# Patient Record
Sex: Female | Born: 1945 | Race: Black or African American | Hispanic: No | Marital: Married | Smoking: Former smoker
Health system: Southern US, Community
[De-identification: ages and names within clinical notes are randomized; demographics above are authoritative.]

## PROBLEM LIST (undated history)

## (undated) DIAGNOSIS — I10 Essential (primary) hypertension: Secondary | ICD-10-CM

## (undated) DIAGNOSIS — I739 Peripheral vascular disease, unspecified: Secondary | ICD-10-CM

## (undated) DIAGNOSIS — K449 Diaphragmatic hernia without obstruction or gangrene: Secondary | ICD-10-CM

## (undated) DIAGNOSIS — E785 Hyperlipidemia, unspecified: Secondary | ICD-10-CM

## (undated) DIAGNOSIS — K219 Gastro-esophageal reflux disease without esophagitis: Secondary | ICD-10-CM

## (undated) DIAGNOSIS — M1990 Unspecified osteoarthritis, unspecified site: Secondary | ICD-10-CM

## (undated) DIAGNOSIS — K5900 Constipation, unspecified: Secondary | ICD-10-CM

## (undated) DIAGNOSIS — T7840XA Allergy, unspecified, initial encounter: Secondary | ICD-10-CM

## (undated) DIAGNOSIS — M6289 Other specified disorders of muscle: Secondary | ICD-10-CM

## (undated) DIAGNOSIS — M545 Low back pain, unspecified: Secondary | ICD-10-CM

## (undated) DIAGNOSIS — H3530 Unspecified macular degeneration: Secondary | ICD-10-CM

## (undated) DIAGNOSIS — D126 Benign neoplasm of colon, unspecified: Secondary | ICD-10-CM

## (undated) DIAGNOSIS — D649 Anemia, unspecified: Secondary | ICD-10-CM

## (undated) DIAGNOSIS — K589 Irritable bowel syndrome without diarrhea: Secondary | ICD-10-CM

## (undated) DIAGNOSIS — K222 Esophageal obstruction: Secondary | ICD-10-CM

## (undated) HISTORY — DX: Unspecified macular degeneration: H35.30

## (undated) HISTORY — DX: Esophageal obstruction: K22.2

## (undated) HISTORY — DX: Unspecified osteoarthritis, unspecified site: M19.90

## (undated) HISTORY — PX: BACK SURGERY: SHX140

## (undated) HISTORY — DX: Low back pain: M54.5

## (undated) HISTORY — PX: UPPER GASTROINTESTINAL ENDOSCOPY: SHX188

## (undated) HISTORY — DX: Allergy, unspecified, initial encounter: T78.40XA

## (undated) HISTORY — DX: Essential (primary) hypertension: I10

## (undated) HISTORY — DX: Peripheral vascular disease, unspecified: I73.9

## (undated) HISTORY — PX: KNEE SURGERY: SHX244

## (undated) HISTORY — PX: TOTAL ABDOMINAL HYSTERECTOMY W/ BILATERAL SALPINGOOPHORECTOMY: SHX83

## (undated) HISTORY — DX: Diaphragmatic hernia without obstruction or gangrene: K44.9

## (undated) HISTORY — DX: Constipation, unspecified: K59.00

## (undated) HISTORY — DX: Benign neoplasm of colon, unspecified: D12.6

## (undated) HISTORY — DX: Irritable bowel syndrome, unspecified: K58.9

## (undated) HISTORY — DX: Low back pain, unspecified: M54.50

## (undated) HISTORY — PX: OTHER SURGICAL HISTORY: SHX169

## (undated) HISTORY — PX: LUMBAR DISC SURGERY: SHX700

## (undated) HISTORY — PX: POLYPECTOMY: SHX149

## (undated) HISTORY — DX: Hyperlipidemia, unspecified: E78.5

## (undated) HISTORY — PX: COLONOSCOPY: SHX174

## (undated) HISTORY — PX: NECK SURGERY: SHX720

## (undated) HISTORY — DX: Gastro-esophageal reflux disease without esophagitis: K21.9

## (undated) HISTORY — PX: CHOLECYSTECTOMY: SHX55

## (undated) HISTORY — DX: Anemia, unspecified: D64.9

## (undated) HISTORY — DX: Other specified disorders of muscle: M62.89

---

## 1990-11-02 ENCOUNTER — Encounter: Payer: Self-pay | Admitting: Internal Medicine

## 1992-11-03 ENCOUNTER — Encounter: Payer: Self-pay | Admitting: Internal Medicine

## 1992-12-02 ENCOUNTER — Encounter: Payer: Self-pay | Admitting: Internal Medicine

## 1998-02-24 ENCOUNTER — Encounter: Admission: RE | Admit: 1998-02-24 | Discharge: 1998-02-24 | Payer: Self-pay | Admitting: *Deleted

## 1998-08-18 ENCOUNTER — Encounter: Payer: Self-pay | Admitting: Internal Medicine

## 1999-04-21 ENCOUNTER — Encounter: Admission: RE | Admit: 1999-04-21 | Discharge: 1999-04-21 | Payer: Self-pay | Admitting: Otolaryngology

## 1999-04-21 ENCOUNTER — Encounter: Payer: Self-pay | Admitting: Otolaryngology

## 2000-05-30 ENCOUNTER — Encounter: Payer: Self-pay | Admitting: Internal Medicine

## 2000-05-31 ENCOUNTER — Encounter: Payer: Self-pay | Admitting: Internal Medicine

## 2000-07-24 ENCOUNTER — Encounter: Payer: Self-pay | Admitting: Internal Medicine

## 2000-07-26 ENCOUNTER — Encounter: Payer: Self-pay | Admitting: Internal Medicine

## 2000-08-02 ENCOUNTER — Encounter: Payer: Self-pay | Admitting: Internal Medicine

## 2000-10-26 ENCOUNTER — Encounter: Payer: Self-pay | Admitting: Anesthesiology

## 2000-10-31 ENCOUNTER — Observation Stay (HOSPITAL_COMMUNITY): Admission: RE | Admit: 2000-10-31 | Discharge: 2000-11-01 | Payer: Self-pay | Admitting: *Deleted

## 2000-10-31 ENCOUNTER — Encounter (INDEPENDENT_AMBULATORY_CARE_PROVIDER_SITE_OTHER): Payer: Self-pay | Admitting: *Deleted

## 2000-10-31 ENCOUNTER — Encounter (INDEPENDENT_AMBULATORY_CARE_PROVIDER_SITE_OTHER): Payer: Self-pay | Admitting: Specialist

## 2000-12-11 ENCOUNTER — Encounter: Payer: Self-pay | Admitting: Internal Medicine

## 2001-01-17 ENCOUNTER — Encounter: Payer: Self-pay | Admitting: Family Medicine

## 2001-01-17 ENCOUNTER — Encounter: Admission: RE | Admit: 2001-01-17 | Discharge: 2001-01-17 | Payer: Self-pay | Admitting: Family Medicine

## 2002-06-03 ENCOUNTER — Encounter: Payer: Self-pay | Admitting: Family Medicine

## 2002-06-03 ENCOUNTER — Encounter: Admission: RE | Admit: 2002-06-03 | Discharge: 2002-06-03 | Payer: Self-pay | Admitting: Family Medicine

## 2002-11-10 ENCOUNTER — Encounter: Payer: Self-pay | Admitting: Internal Medicine

## 2002-11-21 ENCOUNTER — Emergency Department (HOSPITAL_COMMUNITY): Admission: EM | Admit: 2002-11-21 | Discharge: 2002-11-21 | Payer: Self-pay | Admitting: Emergency Medicine

## 2003-05-06 ENCOUNTER — Encounter: Payer: Self-pay | Admitting: Internal Medicine

## 2003-06-09 ENCOUNTER — Ambulatory Visit (HOSPITAL_COMMUNITY): Admission: RE | Admit: 2003-06-09 | Discharge: 2003-06-09 | Payer: Self-pay | Admitting: Internal Medicine

## 2003-06-09 ENCOUNTER — Encounter: Payer: Self-pay | Admitting: Internal Medicine

## 2003-06-09 DIAGNOSIS — K319 Disease of stomach and duodenum, unspecified: Secondary | ICD-10-CM | POA: Insufficient documentation

## 2003-07-22 ENCOUNTER — Encounter: Admission: RE | Admit: 2003-07-22 | Discharge: 2003-07-22 | Payer: Self-pay | Admitting: Family Medicine

## 2003-10-11 ENCOUNTER — Emergency Department (HOSPITAL_COMMUNITY): Admission: EM | Admit: 2003-10-11 | Discharge: 2003-10-12 | Payer: Self-pay | Admitting: Emergency Medicine

## 2003-10-21 ENCOUNTER — Encounter: Payer: Self-pay | Admitting: Internal Medicine

## 2003-10-22 ENCOUNTER — Encounter (INDEPENDENT_AMBULATORY_CARE_PROVIDER_SITE_OTHER): Payer: Self-pay | Admitting: *Deleted

## 2003-10-22 ENCOUNTER — Ambulatory Visit (HOSPITAL_COMMUNITY): Admission: RE | Admit: 2003-10-22 | Discharge: 2003-10-22 | Payer: Self-pay | Admitting: Internal Medicine

## 2003-12-30 ENCOUNTER — Encounter: Admission: RE | Admit: 2003-12-30 | Discharge: 2003-12-30 | Payer: Self-pay | Admitting: Sports Medicine

## 2004-01-14 ENCOUNTER — Encounter: Admission: RE | Admit: 2004-01-14 | Discharge: 2004-01-14 | Payer: Self-pay | Admitting: Sports Medicine

## 2004-01-28 ENCOUNTER — Encounter: Admission: RE | Admit: 2004-01-28 | Discharge: 2004-01-28 | Payer: Self-pay | Admitting: Sports Medicine

## 2004-05-13 ENCOUNTER — Ambulatory Visit: Payer: Self-pay | Admitting: Family Medicine

## 2004-05-20 ENCOUNTER — Ambulatory Visit: Payer: Self-pay | Admitting: Family Medicine

## 2004-07-06 ENCOUNTER — Encounter: Admission: RE | Admit: 2004-07-06 | Discharge: 2004-07-06 | Payer: Self-pay | Admitting: Sports Medicine

## 2004-07-13 ENCOUNTER — Encounter: Admission: RE | Admit: 2004-07-13 | Discharge: 2004-07-13 | Payer: Self-pay | Admitting: Family Medicine

## 2004-09-21 ENCOUNTER — Encounter: Admission: RE | Admit: 2004-09-21 | Discharge: 2004-09-21 | Payer: Self-pay | Admitting: Sports Medicine

## 2004-10-24 ENCOUNTER — Encounter: Admission: RE | Admit: 2004-10-24 | Discharge: 2004-10-24 | Payer: Self-pay | Admitting: Sports Medicine

## 2005-05-22 ENCOUNTER — Ambulatory Visit: Payer: Self-pay | Admitting: Family Medicine

## 2005-06-14 ENCOUNTER — Encounter: Admission: RE | Admit: 2005-06-14 | Discharge: 2005-06-14 | Payer: Self-pay | Admitting: Family Medicine

## 2005-07-06 ENCOUNTER — Encounter: Admission: RE | Admit: 2005-07-06 | Discharge: 2005-07-06 | Payer: Self-pay | Admitting: Sports Medicine

## 2005-09-05 ENCOUNTER — Encounter: Admission: RE | Admit: 2005-09-05 | Discharge: 2005-09-05 | Payer: Self-pay | Admitting: Sports Medicine

## 2005-09-12 ENCOUNTER — Ambulatory Visit: Payer: Self-pay | Admitting: Family Medicine

## 2006-01-08 ENCOUNTER — Ambulatory Visit: Payer: Self-pay | Admitting: Family Medicine

## 2006-05-24 ENCOUNTER — Ambulatory Visit: Payer: Self-pay | Admitting: Family Medicine

## 2006-05-24 LAB — CONVERTED CEMR LAB
ALT: 11 units/L (ref 0–40)
AST: 14 units/L (ref 0–37)
BUN: 12 mg/dL (ref 6–23)
Bilirubin, Direct: 0.1 mg/dL (ref 0.0–0.3)
Calcium: 9.7 mg/dL (ref 8.4–10.5)
Chloride: 100 meq/L (ref 96–112)
Eosinophils Relative: 4.1 % (ref 0.0–5.0)
GFR calc non Af Amer: 68 mL/min
HCT: 35.9 % — ABNORMAL LOW (ref 36.0–46.0)
Lymphocytes Relative: 35.2 % (ref 12.0–46.0)
Monocytes Absolute: 0.4 10*3/uL (ref 0.2–0.7)
Neutro Abs: 3.4 10*3/uL (ref 1.4–7.7)
Neutrophils Relative %: 53.4 % (ref 43.0–77.0)
Platelets: 264 10*3/uL (ref 150–400)
Potassium: 3.6 meq/L (ref 3.5–5.1)
Sodium: 137 meq/L (ref 135–145)
Total CHOL/HDL Ratio: 5.5
VLDL: 25 mg/dL (ref 0–40)
WBC: 6.5 10*3/uL (ref 4.5–10.5)

## 2006-05-31 ENCOUNTER — Ambulatory Visit: Payer: Self-pay | Admitting: Family Medicine

## 2006-06-21 ENCOUNTER — Encounter: Admission: RE | Admit: 2006-06-21 | Discharge: 2006-06-21 | Payer: Self-pay | Admitting: Family Medicine

## 2006-07-31 ENCOUNTER — Ambulatory Visit: Payer: Self-pay | Admitting: Family Medicine

## 2006-07-31 LAB — CONVERTED CEMR LAB
ALT: 13 units/L (ref 0–40)
Alkaline Phosphatase: 92 units/L (ref 39–117)
HDL: 36.1 mg/dL — ABNORMAL LOW (ref >39.0)
Total CHOL/HDL Ratio: 3.8
Total Protein: 6.4 g/dL (ref 6.0–8.3)

## 2006-08-02 ENCOUNTER — Ambulatory Visit: Payer: Self-pay | Admitting: Internal Medicine

## 2006-11-13 ENCOUNTER — Ambulatory Visit: Payer: Self-pay | Admitting: Internal Medicine

## 2006-11-15 ENCOUNTER — Ambulatory Visit: Payer: Self-pay | Admitting: Internal Medicine

## 2006-11-15 ENCOUNTER — Encounter: Payer: Self-pay | Admitting: Family Medicine

## 2006-12-14 ENCOUNTER — Encounter: Admission: RE | Admit: 2006-12-14 | Discharge: 2006-12-14 | Payer: Self-pay | Admitting: Sports Medicine

## 2006-12-27 ENCOUNTER — Ambulatory Visit: Payer: Self-pay | Admitting: Internal Medicine

## 2006-12-28 ENCOUNTER — Encounter: Admission: RE | Admit: 2006-12-28 | Discharge: 2006-12-28 | Payer: Self-pay | Admitting: Sports Medicine

## 2007-01-04 ENCOUNTER — Ambulatory Visit: Payer: Self-pay | Admitting: Family Medicine

## 2007-01-11 ENCOUNTER — Encounter: Admission: RE | Admit: 2007-01-11 | Discharge: 2007-01-11 | Payer: Self-pay | Admitting: Sports Medicine

## 2007-05-13 ENCOUNTER — Telehealth: Payer: Self-pay | Admitting: Family Medicine

## 2007-06-04 DIAGNOSIS — K589 Irritable bowel syndrome without diarrhea: Secondary | ICD-10-CM | POA: Insufficient documentation

## 2007-06-04 DIAGNOSIS — M129 Arthropathy, unspecified: Secondary | ICD-10-CM | POA: Insufficient documentation

## 2007-06-04 DIAGNOSIS — K219 Gastro-esophageal reflux disease without esophagitis: Secondary | ICD-10-CM | POA: Insufficient documentation

## 2007-06-04 DIAGNOSIS — E785 Hyperlipidemia, unspecified: Secondary | ICD-10-CM | POA: Insufficient documentation

## 2007-06-04 DIAGNOSIS — I10 Essential (primary) hypertension: Secondary | ICD-10-CM | POA: Insufficient documentation

## 2007-06-18 ENCOUNTER — Ambulatory Visit: Payer: Self-pay | Admitting: Family Medicine

## 2007-06-18 LAB — CONVERTED CEMR LAB
AST: 17 units/L (ref 0–37)
Albumin: 3.9 g/dL (ref 3.5–5.2)
Alkaline Phosphatase: 70 units/L (ref 39–117)
BUN: 13 mg/dL (ref 6–23)
Bilirubin, Direct: 0.1 mg/dL (ref 0.0–0.3)
Blood in Urine, dipstick: NEGATIVE
Chloride: 104 meq/L (ref 96–112)
Glucose, Bld: 93 mg/dL (ref 70–99)
HCT: 34.9 % — ABNORMAL LOW (ref 36.0–46.0)
HDL: 34.1 mg/dL — ABNORMAL LOW (ref >39.0)
Ketones, urine, test strip: NEGATIVE
LDL Cholesterol: 102 mg/dL — ABNORMAL HIGH (ref 0–99)
MCHC: 33 g/dL (ref 30.0–36.0)
MCV: 95.1 fL (ref 78.0–100.0)
Neutro Abs: 2.9 10*3/uL (ref 1.4–7.7)
Nitrite: NEGATIVE
Potassium: 4.4 meq/L (ref 3.5–5.1)
Protein, U semiquant: NEGATIVE
RBC: 3.67 M/uL — ABNORMAL LOW (ref 3.87–5.11)
RDW: 12.7 % (ref 11.5–14.6)
Sodium: 141 meq/L (ref 135–145)
Total Protein: 6.6 g/dL (ref 6.0–8.3)
Urobilinogen, UA: 0.2
WBC Urine, dipstick: NEGATIVE

## 2007-06-25 ENCOUNTER — Ambulatory Visit: Payer: Self-pay | Admitting: Family Medicine

## 2007-06-25 DIAGNOSIS — I739 Peripheral vascular disease, unspecified: Secondary | ICD-10-CM | POA: Insufficient documentation

## 2007-06-25 DIAGNOSIS — J309 Allergic rhinitis, unspecified: Secondary | ICD-10-CM | POA: Insufficient documentation

## 2007-06-25 LAB — CONVERTED CEMR LAB
Basophils Relative: 1 % (ref 0.0–1.0)
HCT: 35.3 % — ABNORMAL LOW (ref 36.0–46.0)
Hemoglobin: 11.3 g/dL — ABNORMAL LOW (ref 12.0–15.0)
Lymphocytes Relative: 35.5 % (ref 12.0–46.0)
MCV: 97.4 fL (ref 78.0–100.0)
Monocytes Relative: 8 % (ref 3.0–12.0)
Neutro Abs: 3.1 10*3/uL (ref 1.4–7.7)
Neutrophils Relative %: 49.5 % (ref 43.0–77.0)
Platelets: 250 10*3/uL (ref 150–400)
RDW: 12.5 % (ref 11.5–14.6)
Transferrin: 258.5 mg/dL (ref >=212.0)

## 2007-06-26 ENCOUNTER — Encounter: Payer: Self-pay | Admitting: Family Medicine

## 2007-06-26 LAB — CONVERTED CEMR LAB: Retic Ct Pct: 1.8 % (ref 0.4–3.1)

## 2007-06-27 ENCOUNTER — Ambulatory Visit: Payer: Self-pay | Admitting: Family Medicine

## 2007-06-27 DIAGNOSIS — D510 Vitamin B12 deficiency anemia due to intrinsic factor deficiency: Secondary | ICD-10-CM | POA: Insufficient documentation

## 2007-07-08 ENCOUNTER — Encounter: Payer: Self-pay | Admitting: Family Medicine

## 2007-07-23 ENCOUNTER — Ambulatory Visit: Payer: Self-pay | Admitting: Family Medicine

## 2007-07-23 LAB — CONVERTED CEMR LAB
Basophils Absolute: 0 10*3/uL (ref 0.0–0.1)
Eosinophils Absolute: 0.4 10*3/uL (ref 0.0–0.7)
Eosinophils Relative: 5.6 % — ABNORMAL HIGH (ref 0.0–5.0)
MCV: 96.5 fL (ref 78.0–100.0)
Monocytes Absolute: 0.5 10*3/uL (ref 0.1–1.0)
Monocytes Relative: 7.8 % (ref 3.0–12.0)
Neutro Abs: 3.5 10*3/uL (ref 1.4–7.7)
Platelets: 255 10*3/uL (ref 150–400)
RBC: 3.59 M/uL — ABNORMAL LOW (ref 3.87–5.11)
WBC: 6.8 10*3/uL (ref 4.5–10.5)

## 2007-08-13 ENCOUNTER — Telehealth: Payer: Self-pay | Admitting: Family Medicine

## 2007-09-23 ENCOUNTER — Ambulatory Visit: Payer: Self-pay | Admitting: Family Medicine

## 2007-09-23 LAB — CONVERTED CEMR LAB
Basophils Relative: 0.8 % (ref 0.0–1.0)
Eosinophils Absolute: 0.4 10*3/uL (ref 0.0–0.7)
Eosinophils Relative: 6.4 % — ABNORMAL HIGH (ref 0.0–5.0)
Hemoglobin: 11.4 g/dL — ABNORMAL LOW (ref 12.0–15.0)
Lymphocytes Relative: 32 % (ref 12.0–46.0)
MCV: 96.4 fL (ref 78.0–100.0)
Monocytes Absolute: 0.5 10*3/uL (ref 0.1–1.0)
Monocytes Relative: 8.7 % (ref 3.0–12.0)
Platelets: 257 10*3/uL (ref 150–400)
RBC: 3.44 M/uL — ABNORMAL LOW (ref 3.87–5.11)

## 2007-10-01 ENCOUNTER — Telehealth: Payer: Self-pay | Admitting: *Deleted

## 2007-10-03 ENCOUNTER — Telehealth: Payer: Self-pay | Admitting: Family Medicine

## 2008-01-09 ENCOUNTER — Ambulatory Visit: Payer: Self-pay | Admitting: Family Medicine

## 2008-05-07 ENCOUNTER — Encounter: Admission: RE | Admit: 2008-05-07 | Discharge: 2008-05-07 | Payer: Self-pay | Admitting: Sports Medicine

## 2008-05-25 ENCOUNTER — Encounter: Admission: RE | Admit: 2008-05-25 | Discharge: 2008-05-25 | Payer: Self-pay | Admitting: Sports Medicine

## 2008-06-09 ENCOUNTER — Telehealth: Payer: Self-pay | Admitting: Family Medicine

## 2008-06-19 ENCOUNTER — Telehealth: Payer: Self-pay | Admitting: Family Medicine

## 2008-07-02 ENCOUNTER — Telehealth: Payer: Self-pay | Admitting: Family Medicine

## 2008-07-09 ENCOUNTER — Ambulatory Visit: Payer: Self-pay | Admitting: Family Medicine

## 2008-07-09 LAB — CONVERTED CEMR LAB
AST: 20 units/L (ref 0–37)
Albumin: 3.6 g/dL (ref 3.5–5.2)
Alkaline Phosphatase: 73 units/L (ref 39–117)
BUN: 12 mg/dL (ref 6–23)
Basophils Absolute: 0 10*3/uL (ref 0.0–0.1)
Calcium: 9.5 mg/dL (ref 8.4–10.5)
Chloride: 104 meq/L (ref 96–112)
Creatinine, Ser: 0.8 mg/dL (ref 0.4–1.2)
Eosinophils Relative: 1.8 % (ref 0.0–5.0)
Glucose, Bld: 100 mg/dL — ABNORMAL HIGH (ref 70–99)
LDL Cholesterol: 98 mg/dL (ref 0–99)
Lymphocytes Relative: 28.7 % (ref 12.0–46.0)
Lymphs Abs: 2.2 10*3/uL (ref 0.7–4.0)
MCHC: 33.4 g/dL (ref 30.0–36.0)
MCV: 98.3 fL (ref 78.0–100.0)
Monocytes Relative: 6.5 % (ref 3.0–12.0)
Neutrophils Relative %: 62.4 % (ref 43.0–77.0)
Potassium: 3.5 meq/L (ref 3.5–5.1)
RBC: 3.52 M/uL — ABNORMAL LOW (ref 3.87–5.11)
RDW: 14.1 % (ref 11.5–14.6)
Total Bilirubin: 0.7 mg/dL (ref 0.3–1.2)
Total CHOL/HDL Ratio: 4
Total Protein: 6.9 g/dL (ref 6.0–8.3)

## 2008-07-14 ENCOUNTER — Encounter: Payer: Self-pay | Admitting: Family Medicine

## 2008-07-22 ENCOUNTER — Encounter: Admission: RE | Admit: 2008-07-22 | Discharge: 2008-07-22 | Payer: Self-pay | Admitting: Sports Medicine

## 2008-08-18 ENCOUNTER — Ambulatory Visit: Payer: Self-pay | Admitting: Family Medicine

## 2008-09-07 ENCOUNTER — Telehealth: Payer: Self-pay | Admitting: Internal Medicine

## 2008-09-15 ENCOUNTER — Inpatient Hospital Stay (HOSPITAL_COMMUNITY): Admission: AD | Admit: 2008-09-15 | Discharge: 2008-09-17 | Payer: Self-pay | Admitting: Neurosurgery

## 2008-11-02 ENCOUNTER — Ambulatory Visit: Payer: Self-pay | Admitting: Family Medicine

## 2009-02-24 ENCOUNTER — Encounter: Payer: Self-pay | Admitting: Family Medicine

## 2009-07-22 LAB — HM MAMMOGRAPHY

## 2009-08-02 ENCOUNTER — Encounter: Payer: Self-pay | Admitting: Family Medicine

## 2009-09-02 ENCOUNTER — Telehealth: Payer: Self-pay | Admitting: Family Medicine

## 2009-09-09 ENCOUNTER — Ambulatory Visit: Payer: Self-pay | Admitting: Family Medicine

## 2009-09-09 DIAGNOSIS — N952 Postmenopausal atrophic vaginitis: Secondary | ICD-10-CM | POA: Insufficient documentation

## 2009-09-15 ENCOUNTER — Ambulatory Visit: Payer: Self-pay | Admitting: Family Medicine

## 2009-09-15 LAB — CONVERTED CEMR LAB
ALT: 13 units/L (ref 0–35)
AST: 16 units/L (ref 0–37)
Albumin: 4.2 g/dL (ref 3.5–5.2)
BUN: 11 mg/dL (ref 6–23)
Basophils Absolute: 0.1 10*3/uL (ref 0.0–0.1)
Bilirubin Urine: NEGATIVE
Blood in Urine, dipstick: NEGATIVE
CO2: 32 meq/L (ref 19–32)
Calcium: 9.3 mg/dL (ref 8.4–10.5)
Chloride: 104 meq/L (ref 96–112)
Direct LDL: 95.4 mg/dL
Eosinophils Relative: 6.1 % — ABNORMAL HIGH (ref 0.0–5.0)
Glucose, Bld: 97 mg/dL (ref 70–99)
HDL: 40.5 mg/dL (ref >39.00)
Lymphocytes Relative: 31.9 % (ref 12.0–46.0)
MCHC: 34 g/dL (ref 30.0–36.0)
MCV: 94.7 fL (ref 78.0–100.0)
Neutrophils Relative %: 53.7 % (ref 43.0–77.0)
Nitrite: NEGATIVE
Platelets: 250 10*3/uL (ref 150.0–400.0)
Potassium: 4 meq/L (ref 3.5–5.1)
TSH: 1.47 microintl units/mL (ref 0.35–5.50)
Total Protein: 7.1 g/dL (ref 6.0–8.3)
Urobilinogen, UA: 1
VLDL: 40.6 mg/dL — ABNORMAL HIGH (ref 0.0–40.0)
WBC Urine, dipstick: NEGATIVE
pH: 7

## 2009-09-30 ENCOUNTER — Ambulatory Visit: Payer: Self-pay | Admitting: Family Medicine

## 2009-09-30 ENCOUNTER — Telehealth: Payer: Self-pay | Admitting: Internal Medicine

## 2009-10-25 ENCOUNTER — Ambulatory Visit: Payer: Self-pay | Admitting: Internal Medicine

## 2009-10-29 ENCOUNTER — Encounter: Payer: Self-pay | Admitting: Internal Medicine

## 2009-11-25 ENCOUNTER — Ambulatory Visit: Payer: Self-pay | Admitting: Internal Medicine

## 2009-11-25 LAB — HM COLONOSCOPY

## 2009-12-01 ENCOUNTER — Encounter: Payer: Self-pay | Admitting: Internal Medicine

## 2010-01-13 ENCOUNTER — Telehealth: Payer: Self-pay | Admitting: Internal Medicine

## 2010-02-08 ENCOUNTER — Ambulatory Visit: Payer: Self-pay | Admitting: Internal Medicine

## 2010-02-08 DIAGNOSIS — Z860100 Personal history of colon polyps, unspecified: Secondary | ICD-10-CM | POA: Insufficient documentation

## 2010-03-02 ENCOUNTER — Encounter: Payer: Self-pay | Admitting: Family Medicine

## 2010-04-17 ENCOUNTER — Encounter: Payer: Self-pay | Admitting: Family Medicine

## 2010-04-17 ENCOUNTER — Encounter: Payer: Self-pay | Admitting: Internal Medicine

## 2010-04-18 ENCOUNTER — Ambulatory Visit
Admission: RE | Admit: 2010-04-18 | Discharge: 2010-04-18 | Payer: Self-pay | Source: Home / Self Care | Attending: Family Medicine | Admitting: Family Medicine

## 2010-04-20 ENCOUNTER — Telehealth: Payer: Self-pay | Admitting: Family Medicine

## 2010-04-26 NOTE — Miscellaneous (Signed)
Summary: mammmogram  Clinical Lists Changes  Observations: Added new observation of MAMMOGRAM: normal (07/22/2009 17:16)      Preventive Care Screening  Mammogram:    Date:  07/22/2009    Results:  normal

## 2010-04-26 NOTE — Op Note (Signed)
Summary: Operative report                         [facility]  Patient:    Kelsey Sparks, Kelsey Sparks                    MRN: [id number] Proc. Date: 10/31/00 Adm. Date:  [id number] Attending:  Kandis Mannan CC:         Tera Mater. Clent Ridges, M.D.  Jeralyn Bennett, M.D.   Operative Report  CCS NUMBER:  [id number]  PREOPERATIVE DIAGNOSIS:  Chronic cholecystitis with cholelithiasis.  POSTOPERATIVE DIAGNOSIS:  Chronic cholecystitis with cholelithiasis.  OPERATION:  Laparoscopic cholecystectomy.  SURGEON:  Maisie Fus B. Samuella Cota, M.D.  ASSISTANT:  Gita Kudo, M.D.  ANESTHESIA:  General.  ANESTHESIOLOGIST:  Lestine Box, M.D. and CRNA.  DESCRIPTION OF PROCEDURE:  The patient was taken to the operating room and placed on the table in the supine position and after satisfactory general anesthetic with intubation, the entire abdomen was prepped and draped as a sterile field.  A small vertical infraumbilical incision was made through skin and subcutaneous tissue and midline fascia.  A finger was placed into the peritoneal cavity.  A pursestring suture of 0 Vicryl was placed and the Hasson trocar placed in the abdomen, the abdomen insufflated to 14 mmHg pressure.  A second 10 mm trocar was placed just to the right of midline in the subxiphoid area.  Two 5 mm trocars were placed laterally.  The gallbladder was visualized and had adhesions that extended up about two-thirds of the way of the gallbladder.  The gallbladder was quite large and extended beyond the edge of the liver.  The adhesions were taken down, and the area of the cystic duct and cystic artery was dissected.  The cystic duct was readily identified as it was going into the gallbladder.  The cystic duct was fairly long.  The cystic duct was triply clipped on the remaining side, once on the gallbladder side and divided.  This gave better exposure to the cystic artery which was dissected free, triply clipped on the remaining side  and once on the gallbladder side and divided.  The gallbladder was then dissected from the bed using the cautery.  There was another small vessel in the gallbladder bed, and this was doubly clipped.  The gallbladder was freed-up and hemostasis obtained in the gallbladder bed.  The gallbladder was then brought to the infraumbilical incision where it was delivered without difficulty.  The right upper quadrant was irrigated with saline.  Hemostasis was obtained.  The two lateral trocars were removed under direct vision.  The pursestring suture at the infraumbilical incision was tied to give good closure of the fascia.  The abdomen was deflated and the second 10 mm trocar removed.  Marcaine 0.25% without epinephrine had been injected at all trocar sites.  The two midline incisions were closed with running subcuticular sutures of 4-0 Vicryl, and the two lateral trocar sites were closed with single simple subcuticular 4-0 Vicryl.  Benzoin and half-inch Steri-Strips were used to reinforce the skin closure.  Dry sterile dressing was applied.  The patient seemed to tolerate the procedure well and was taken to the PACU in satisfactory condition. DD:  10/31/00 TD:  10/31/00 Job: [id number]

## 2010-04-26 NOTE — Procedures (Signed)
Summary: EGD/[facility]  EGD/[facility]   Imported By: Sherian Rein 10/26/2009 10:54:18  _____________________________________________________________________  External Attachment:    Type:   Image     Comment:   External Document

## 2010-04-26 NOTE — Progress Notes (Signed)
Summary: [facility]   Imported By: Sherian Rein 10/26/2009 11:02:15  _____________________________________________________________________  External Attachment:    Type:   Image     Comment:   External Document

## 2010-04-26 NOTE — Procedures (Signed)
Summary: Colonoscopy  Patient: Kelsey Sparks Note: All result statuses are Final unless otherwise noted.  Tests: (1) Colonoscopy (COL)   COL Colonoscopy           DONE     [facility]     [address]     [place], [place]  [address]           COLONOSCOPY PROCEDURE REPORT           PATIENT:  Kelsey Sparks, Kelsey Sparks  MR#:  [id number]     BIRTHDATE:  1945-10-11, 64 yrs. old  GENDER:  female     ENDOSCOPIST:  Wilhemina Bonito. Eda Keys, MD     REF. BY:  Office Self, M.D.     PROCEDURE DATE:  11/25/2009     PROCEDURE:  Colonoscopy with biopsies,     Colonoscopy with snare polypectomy     x 13     EXTENED SERVICE (TIME > 30 MIN)     AND MULTIPLE POLYPS (N=13)     ASA CLASS:  Class II     INDICATIONS:  Routine Risk Screening, unexplained diarrhea     MEDICATIONS:   Fentanyl 75 mcg IV, Versed 7 mg IV           DESCRIPTION OF PROCEDURE:   After the risks benefits and     alternatives of the procedure were thoroughly explained, informed     consent was obtained.  Digital rectal exam was performed and     revealed no abnormalities.   The LB CF-[id number] endoscope     was introduced through the anus and advanced to the cecum, which     was identified by both the appendix and ileocecal valve, without     limitations.Time to cecum = 6:33 min.  The quality of the prep was     excellent, using MoviPrep.  The instrument was then slowly     withdrawn (time = 21:55 min) as the colon was fully examined.     <<PROCEDUREIMAGES>>           FINDINGS:  There were multiple (13) polyps, measuring between 2mm     and 8mm, identified and removed in the ascending (5) and     transverse (8)  colon. Polyps were snared without cautery.     Retrieval was successful.  Moderate diverticulosis was found     ascending colon to sigmoid colon. Random colon bx of normal     looking mucosa taken to r/o microscopc colitis.  Retroflexed views     in the rectum revealed no abnormalities.    The scope was then  withdrawn from the patient and the procedure completed.           COMPLICATIONS:  None           ENDOSCOPIC IMPRESSION:     1) Polyps, multiple (13) - removed     2) Moderate diverticulosis ascending colon to sigmoid colon           RECOMMENDATIONS:     1) Await biopsy results     2) Follow up colonoscopy in ONE year     3) EGD today           ______________________________     Wilhemina Bonito. Eda Keys, MD           CC:  The Patient; Roderick Pee, MD           n.     eSIGNED:  Wilhemina Bonito. Eda Keys at 11/25/2009 03:32 PM           Montel Clock, [id number]  Note: An exclamation mark (!) indicates a result that was not dispersed into the flowsheet. Document Creation Date: 11/25/2009 3:33 PM _______________________________________________________________________  (1) Order result status: Final Collection or observation date-time: 11/25/2009 15:21 Requested date-time:  Receipt date-time:  Reported date-time:  Referring Physician:   Ordering Physician: Fransico Setters [phone]) Specimen Source:  Source: Launa Grill Order Number: [id number] Lab site:   Appended Document: Colonoscopy recall     Procedures Next Due Date:    Colonoscopy: 11/2014

## 2010-04-26 NOTE — Procedures (Signed)
Summary: EGD   EGD  Procedure date:  06/09/2003  Findings:      Location: [facility]  Findings: Stricture:  GERD Patient Name: Kelsey Sparks, Kelsey Sparks MRN: [id number] Procedure Procedures: Panendoscopy (EGD) CPT: 43235.    with esophageal dilation. CPT: G9296129.  Personnel: Endoscopist: Wilhemina Bonito. Marina Goodell, MD.  Exam Location: Exam performed in Endoscopy Suite.  Patient Consent: Procedure, Alternatives, Risks and Benefits discussed, consent obtained,  Indications Symptoms: Dysphagia. Reflux symptoms  History  Current Medications: Patient is not currently taking Coumadin.  Pre-Exam Physical: Performed Jun 09, 2003  Entire physical exam was normal.  Exam Exam Info: Maximum depth of insertion Duodenum, intended Duodenum. Patient position: on left side. Vocal cords visualized. Gastric retroflexion performed. Images taken. ASA Classification: II.  Sedation Meds: Demerol 60 mg. given IV. Versed 6 mg. given IV.  Monitoring: BP and pulse monitoring done. Oximetry used. Supplemental O2 given  Fluoroscopy: Fluoroscopy was used.  Findings HIATAL HERNIA:  STRICTURE / STENOSIS: Stricture in Distal Esophagus.  Constriction: partial. Etiology: benign due to reflux. 40 cm from mouth. Lumen diameter is 15 mm. ICD9: Esophageal Stricture: 530.3. Comment: no active esophagitis present.  - Dilation: Distal Esophagus. Procedure was performed under Fluoroscopy. Wire Guided/Savary (Wilson-Cook) dilator used, Diameter: 18 mm, No Resistance, No Heme present on extraction. 1  total dilators used. Patient tolerance excellent.   Assessment Abnormal examination, see findings above.  Diagnoses: 530.3: Esophageal Stricture.  530.81: GERD.   Events  Unplanned Intervention: No unplanned interventions were required.  Unplanned Events: There were no complications. Plans Instructions: Nothing to eat or drink for 2 hrs.  Clear or full liquids: 2 hrs. Resume previous diet: am.    Medication(s): Continue current medications.  Disposition: After procedure patient sent to recovery. After recovery patient sent home.  Scheduling: Follow-up prn.   This report was created from the original endoscopy report, which was reviewed and signed by the above listed endoscopist.   cc:  Kelle Darting, MD      The patient

## 2010-04-26 NOTE — Assessment & Plan Note (Signed)
Summary: Followup - post ECL   History of Present Illness Visit Type: Follow-up Visit Primary GI MD: Yancey Flemings MD Primary Provider: Kelle Darting, MD Requesting Provider: n/a Chief Complaint: Patient here for 6 wk f/u after endo/colon. She complains of continued lower abdominal cramping and reflux symptoms. History of Present Illness:   65 year old female with hypertension, hyperlipidemia, chronic low back pain, GERD complicated by peptic stricture and irritable bowel syndrome. She was evaluated in the office October 25, 2009 regarding diarrhea and dysphagia. See that dictation for details. She subsequently underwent colonoscopy and upper endoscopy on November 25, 2009. Colonoscopy revealed multiple (13) adenomatous polyps and moderate diverticulosis. Random biopsies of the colon revealed normal mucosa. Followup colonoscopy, based on current guidelines, recommended for one year. Upper endoscopy revealed a peptic stricture of the distal esophagus. This was dilated with a 54 [place] Maloney dilator. She has continued on AcipHex. She follows up today. She reports no active reflux symptoms. She has had complete resolution of dysphagia. She still continues with some alternating bowel habits and lower abdominal cramping, though less. She has been using Levsin sublingual p.r.n. We reviewed her endoscopy and pathology findings in detail. No new issues.   GI Review of Systems    Reports abdominal pain, acid reflux, and  bloating.     Location of  Abdominal pain: lower abdomen.    Denies belching, chest pain, dysphagia with liquids, dysphagia with solids, heartburn, loss of appetite, nausea, vomiting, vomiting blood, weight loss, and  weight gain.      Reports change in bowel habits, constipation, diarrhea, and  irritable bowel syndrome.     Denies anal fissure, black tarry stools, diverticulosis, fecal incontinence, heme positive stool, hemorrhoids, jaundice, light color stool, liver problems, rectal  bleeding, and  rectal pain. Preventive Screening-Counseling & Management  Caffeine-Diet-Exercise     Does Patient Exercise: yes    Current Medications (verified): 1)  Tenormin 50 Mg  Tabs (Atenolol) .... Take 1 Tablet By Mouth Every Morning 2)  Maxzide-25 37.5-25 Mg  Tabs (Triamterene-Hctz) .... Take 1 Tablet By Mouth Every Morning 3)  Zocor 40 Mg  Tabs (Simvastatin) .... Once Daily 4)  Nasonex 50 Mcg/act  Susp (Mometasone Furoate) .... 2 Squirts Two Times A Day 5)  Flexeril 10 Mg  Tabs (Cyclobenzaprine Hcl) .... 1/2 Three Times A Day As Needed 6)  Zomig Zmt 5 Mg  Tbdp (Zolmitriptan) .... As Needed Ha 7)  Hydrocodone-Acetaminophen 5-500 Mg Tabs (Hydrocodone-Acetaminophen) .... Take 1-2 Tabs By Mouth Every 8 Hours As Needed 8)  Meclizine Hcl 25 Mg Tabs (Meclizine Hcl) .... Take Half To One Tab By Mouth Every 6 Hours As Needed 9)  Eye Vitamins  Caps (Multiple Vitamins-Minerals) .... Once Daily 10)  Premarin 0.625 Mg/gm Crea (Estrogens, Conjugated) .... Uad 11)  Levsin/sl 0.125 Mg Subl (Hyoscyamine Sulfate) .... Use As Directed 12)  Aciphex 20 Mg Tbec (Rabeprazole Sodium) .... Take 1 By Mouth Once Daily  Allergies (verified): No Known Drug Allergies  Past History:  Past Medical History: Reviewed history from 07/23/2007 and no changes required. Allergic rhinitis Hyperlipidemia Hypertension Low back pain Peripheral vascular disease macular degeneration TAH/BSO, nonmalignant reasons tobacco abuse lumbar disk surgery childbirth x 2 torn cartilage right knee surgical repair pernicious anemia  Past Surgical History: Reviewed history from 10/25/2009 and no changes required. hysterectomy cholecystectomy Back Surgery Knee Arthroscopy-Right Neck Surgery  Family History: Reviewed history from 10/25/2009 and no changes required. father died from hypertension, arthritis mother has arthritis and asthma, still living 3 brothers  in good health two sisters in good health No FH of  Colon Cancer: Family History of Breast Cancer: Mat Aunt  Social History: Retired Married Alcohol use-no Drug use-no Former Smoker-stopped 6/10 Patient gets regular exercise.-yes  Review of Systems       The patient complains of arthritis/joint pain, back pain, sleeping problems, thirst - excessive, urination - excessive, urination changes/pain, and urine leakage.  The patient denies allergy/sinus, anemia, anxiety-new, blood in urine, breast changes/lumps, change in vision, confusion, cough, coughing up blood, depression-new, fainting, fatigue, fever, headaches-new, hearing problems, heart murmur, heart rhythm changes, itching, menstrual pain, muscle pains/cramps, night sweats, nosebleeds, pregnancy symptoms, shortness of breath, skin rash, sore throat, swelling of feet/legs, swollen lymph glands, thirst - excessive , urination - excessive , vision changes, and voice change.    Vital Signs:  Patient profile:   65 year old female Menstrual status:  hysterectomy Height:      64 inches Weight:      146 pounds BMI:     25.15 BSA:     1.71 Pulse rate:   76 / minute Pulse rhythm:   irregular BP sitting:   116 / 80  (right arm)  Vitals Entered By: Lamona Curl CMA Duncan Dull) (February 08, 2010 9:36 AM)  Physical Exam  General:  Well developed, well nourished, no acute distress. Head:  Normocephalic and atraumatic. Eyes:  PERRLA, no icterus. Abdomen:  Soft, nontender and nondistended. No masses, hepatosplenomegaly or hernias noted. Normal bowel sounds. Pulses:  Normal pulses noted. Neurologic:  alert and oriented Psych:  Alert and cooperative. Normal mood and affect.   Impression & Recommendations:  Problem # 1:  DYSPHAGIA (ICD-787.29) secondary to peptic stricture (530.3). Resolved post dilation on PPI.  Plan: #1. Continue PPI therapy #2. Follow up p.r.n. recurrent dysphagia  Problem # 2:  GERD (ICD-530.81) GERD. Good control a classic GERD symptoms on PPI.  Plan: #1.  Continue PPI #2. Reflux precautions  Problem # 3:  PEPTIC STRICTURE (ICD-537.89) please see #1 above  Problem # 4:  IBS (ICD-564.1) chronic IBS. Ongoing. Negative random colon biopsies.  Plan: #1. High fiber diet #2. Antispasmodics p.r.n. #3. Follow up p.r.n.  Problem # 5:  PERSONAL HX COLONIC POLYPS (ICD-V12.72) multiple adenomatous colon polyps on recent colonoscopy. Repeat colonoscopy in one year based on current guidelines. Patient aware  Patient Instructions: 1)  Please schedule a follow-up appointment as needed.  2)  Copy sent to : Kelle Darting, MD 3)  The medication list was reviewed and reconciled.  All changed / newly prescribed medications were explained.  A complete medication list was provided to the patient / caregiver.

## 2010-04-26 NOTE — Progress Notes (Signed)
Summary: med change  Phone Note Call from Patient Call back at Home Phone [phone]   Caller: Patient Call For: Dr. Marina Goodell Reason for Call: Talk to Nurse Summary of Call: would like to up pill count for "Orion Modest" to a 90 day supply Initial call taken by: Vallarie Mare,  January 13, 2010 9:51 AM    Prescriptions: LEVSIN/SL 0.125 MG SUBL (HYOSCYAMINE SULFATE) use as directed  #90 x 3   Entered by:   Milford Cage NCMA   Authorized by:   Hilarie Fredrickson MD   Signed by:   Milford Cage NCMA on 01/13/2010   Method used:   Electronically to        [facility]  [address] [id number] (retail)       [address]       [place], [place]  [address]       Ph: [phone] or [phone]       Fax: [phone]   RxID:   [id number]

## 2010-04-26 NOTE — Consult Note (Signed)
Summary: [facility]   Imported By: Sherian Rein 10/26/2009 11:07:29  _____________________________________________________________________  External Attachment:    Type:   Image     Comment:   External Document

## 2010-04-26 NOTE — Assessment & Plan Note (Signed)
Summary: IBS / GERD   History of Present Illness Visit Type: Follow-up Visit Primary GI MD: Yancey Flemings MD Chief Complaint: Pt states she is having diarrhea.  She has a history of IBS.  She will have a BM after eating.  Pt also states she is bloated.  Pt also having problems swallowing solids History of Present Illness:   65 year old female with hypertension, hyperlipidemia, chronic low back pain, GERD complicated by peptic stricture, and irritable bowel syndrome. She presents today with the myriad GI complaints that have occurred over the past month or 2. She reports problems with postprandial lower abdominal cramping with urgency followed by loose stools. Some nausea but no vomiting. No bleeding. 5 pound weight loss. Next, some breakthrough reflux symptoms despite Nexium 40 mg daily. She requests a trial of AcipHex, stating that it helps her friend. She also reports some recurrent intermittent solid food dysphagia. Review of outside records shows that laboratories obtained September 15, 2009 revealed normal comprehensive metabolic panel and TSH. CBC was normal except for mild anemia with hemoglobin of 11.3..   GI Review of Systems    Reports abdominal pain, acid reflux, bloating, dysphagia with solids, nausea, and  weight loss.     Location of  Abdominal pain: lower abdomen.    Denies belching, chest pain, dysphagia with liquids, heartburn, loss of appetite, vomiting, vomiting blood, and  weight gain.      Reports change in bowel habits, diarrhea, and  irritable bowel syndrome.     Denies anal fissure, black tarry stools, constipation, diverticulosis, fecal incontinence, heme positive stool, hemorrhoids, jaundice, light color stool, liver problems, rectal bleeding, and  rectal pain.    Current Medications (verified): 1)  Tenormin 50 Mg  Tabs (Atenolol) .... Take 1 Tablet By Mouth Every Morning 2)  Maxzide-25 37.5-25 Mg  Tabs (Triamterene-Hctz) .... Take 1 Tablet By Mouth Every Morning 3)  Nexium 40  Mg  Cpdr (Esomeprazole Magnesium) .... Take 1 Tablet By Mouth Every Morning 4)  Zocor 40 Mg  Tabs (Simvastatin) .... Once Daily 5)  Nasonex 50 Mcg/act  Susp (Mometasone Furoate) .... 2 Squirts Two Times A Day 6)  Adult Aspirin Ec Low Strength 81 Mg  Tbec (Aspirin) .... Once Daily 7)  Aleve 220 Mg  Tabs (Naproxen Sodium) .... As Needed 8)  Flexeril 10 Mg  Tabs (Cyclobenzaprine Hcl) .... 1/2 Three Times A Day As Needed 9)  Zomig Zmt 5 Mg  Tbdp (Zolmitriptan) .... As Needed Ha 10)  Vitamin B-12 Cr 1000 Mcg  Tbcr (Cyanocobalamin) .... Then 1 Cc Monthly 11)  Bd Eclipse Syringe 27g X 1/2" 1 Ml  Misc (Syringe/needle (Disp)) .... Uad 12)  Hydrocodone-Acetaminophen 5-500 Mg Tabs (Hydrocodone-Acetaminophen) .... Take 1-2 Tabs By Mouth Every 8 Hours As Needed 13)  Meclizine Hcl 25 Mg Tabs (Meclizine Hcl) .... Take Half To One Tab By Mouth Every 6 Hours As Needed 14)  Eye Vitamins  Caps (Multiple Vitamins-Minerals) .... Once Daily 15)  Premarin 0.625 Mg/gm Crea (Estrogens, Conjugated) .... Uad 16)  Levsin/sl 0.125 Mg Subl (Hyoscyamine Sulfate) .... Use As Directed  Allergies (verified): No Known Drug Allergies  Past History:  Past Medical History: Reviewed history from 07/23/2007 and no changes required. Allergic rhinitis Hyperlipidemia Hypertension Low back pain Peripheral vascular disease macular degeneration TAH/BSO, nonmalignant reasons tobacco abuse lumbar disk surgery childbirth x 2 torn cartilage right knee surgical repair pernicious anemia  Past Surgical History: hysterectomy cholecystectomy Back Surgery Knee Arthroscopy-Right Neck Surgery  Family History: father died from hypertension,  arthritis mother has arthritis and asthma, still living 3 brothers in good health two sisters in good health No FH of Colon Cancer: Family History of Breast Cancer: Mat Aunt  Social History: Reviewed history from 09/30/2009 and no changes required. Retired Married Alcohol use-no Drug  use-no Regular exercise-yes Former Smoker,,,6/10  Review of Systems  The patient denies allergy/sinus, anemia, anxiety-new, arthritis/joint pain, back pain, blood in urine, breast changes/lumps, change in vision, confusion, cough, coughing up blood, depression-new, fainting, fatigue, fever, headaches-new, hearing problems, heart murmur, heart rhythm changes, itching, menstrual pain, muscle pains/cramps, night sweats, nosebleeds, pregnancy symptoms, shortness of breath, skin rash, sleeping problems, sore throat, swelling of feet/legs, swollen lymph glands, thirst - excessive , urination - excessive , urination changes/pain, urine leakage, vision changes, and voice change.    Vital Signs:  Patient profile:   65 year old female Menstrual status:  hysterectomy Height:      64 inches Weight:      142 pounds BMI:     24.46 Pulse rate:   80 / minute Pulse rhythm:   regular BP sitting:   90 / 62  (left arm) Cuff size:   regular  Vitals Entered By: Francee Piccolo CMA Duncan Dull) (October 25, 2009 1:33 PM)  Physical Exam  General:  Well developed, well nourished, no acute distress. Head:  Normocephalic and atraumatic. Eyes:  PERRLA, no icterus. Ears:  Normal auditory acuity. Nose:  No deformity, discharge,  or lesions. Mouth:  No deformity or lesions Lungs:  Clear throughout to auscultation. Heart:  Regular rate and rhythm; no murmurs, rubs,  or bruits. Abdomen:  Soft, nontender and nondistended. No masses, hepatosplenomegaly or hernias noted. Normal bowel sounds. Rectal:  deferred until colonoscopy Msk:  Symmetrical with no gross deformities. Normal posture. Pulses:  Normal pulses noted. Extremities:  No clubbing, cyanosis, edema or deformities noted. Neurologic:  Alert and  oriented x4;  grossly normal neurologically. Skin:  Intact without significant lesions or rashes. Psych:  Alert and cooperative. Normal mood and affect.   Impression & Recommendations:  Problem # 1:  DIARRHEA  (ICD-787.91) several month history of problems with postprandial urgency, normal cramping, and diarrhea. Most likely irritable bowel. Last complete colonoscopy over 9 years ago revealing mild diverticulosis only. She has had some mild weight loss. Rule out irritable bowel. Rule out microscopic colitis  Plan: #1. Colonoscopy with biopsies. The nature of the procedure as well as the risks, benefits, and alternatives have been reviewed. She understood and agreed to proceed #2. Refill Levsin sublingual as requested  Problem # 2:  DYSPHAGIA (ICD-787.29) recurrent intermittent dysphagia likely due to peptic stricture   Plan: Upper endoscopy with possible esophageal dilation. The nature of the procedure as well as the risks, benefits, and alternatives have been reviewed. She understood and agreed to proceed  Problem # 3:  GERD (ICD-530.81) some breakthrough symptoms despite daily Nexium  Plan: #1. Reflux precautions #2. Change from Nexium to AcipHex per patient request. AcipHex prescribed.  Other Orders: Colon/Endo (Colon/Endo)  Patient Instructions: 1)  Endo/Colon [facility] 11/25/09 3:30 pm arrive at 2:30 pm 2)  Movi prep instructions given to patient. Movi prep Rx. sent to pharmacy. 3)  Colonoscopy and Flexible Sigmoidoscopy brochure given.  4)  Upper Endoscopy brochure given.  5)  Levsin SL  refilled. 6)  Aciphex 20 mg #30 1 by mouth once daily x 11 RFs. 7)  The medication list was reviewed and reconciled.  All changed / newly prescribed medications were explained.  A complete  medication list was provided to the patient / caregiver. Prescriptions: ACIPHEX 20 MG TBEC (RABEPRAZOLE SODIUM) take 1 by mouth once daily  #30 x 11   Entered by:   Milford Cage NCMA   Authorized by:   Hilarie Fredrickson MD   Signed by:   Milford Cage NCMA on 10/25/2009   Method used:   [facility] [phone][facility] (retail)       [address]       [place], [place]  [address]       Ph: [phone]  or [phone]       Fax: [phone]   RxID:   [id number] LEVSIN/SL 0.125 MG SUBL (HYOSCYAMINE SULFATE) use as directed  #30 x 2   Entered by:   Milford Cage NCMA   Authorized by:   Hilarie Fredrickson MD   Signed by:   Milford Cage NCMA on 10/25/2009   Method used:   [facility]        [facility] [phone]* (retail)       [address]       [place], [place]  [address]       Ph: [phone] or [phone]       Fax: [phone]   RxID:   [id number] MOVIPREP 100 GM  SOLR (PEG-KCL-NACL-NASULF-NA ASC-C) As per prep instructions.  #1 x 0   Entered by:   Milford Cage NCMA   Authorized by:   Hilarie Fredrickson MD   Signed by:   Milford Cage NCMA on 10/25/2009   Method used:   [facility]        [facility] [phone][facility] (retail)       [address]       [place], [place]  [address]       Ph: [phone] or [phone]       Fax: [phone]   RxID:   [id number]

## 2010-04-26 NOTE — Progress Notes (Signed)
Summary[facility]   Imported By: Sherian Rein 10/26/2009 11:05:40  _____________________________________________________________________  External Attachment:    Type:   Image     Comment:   External Document

## 2010-04-26 NOTE — Progress Notes (Signed)
Summary[facility]   Imported By: Sherian Rein 10/26/2009 11:04:15  _____________________________________________________________________  External Attachment:    Type:   Image     Comment:   External Document

## 2010-04-26 NOTE — Progress Notes (Signed)
Summary[facility]   Imported By: Sherian Rein 10/26/2009 11:00:10  _____________________________________________________________________  External Attachment:    Type:   Image     Comment:   External Document

## 2010-04-26 NOTE — Progress Notes (Signed)
Summary[facility]   Imported By: Sherian Rein 10/26/2009 10:57:24  _____________________________________________________________________  External Attachment:    Type:   Image     Comment:   External Document

## 2010-04-26 NOTE — Medication Information (Signed)
Summary: Response form/[facility]  Response form/[facility]   Imported By: Sherian Rein 11/11/2009 12:18:34  _____________________________________________________________________  External Attachment:    Type:   Image     Comment:   External Document

## 2010-04-26 NOTE — Procedures (Signed)
Summary: EGD/[facility]  EGD/[facility]   Imported By: Sherian Rein 10/26/2009 10:52:25  _____________________________________________________________________  External Attachment:    Type:   Image     Comment:   External Document

## 2010-04-26 NOTE — Assessment & Plan Note (Signed)
Summary: cpx/cjr   Vital Signs:  Patient profile:   65 year old female Menstrual status:  hysterectomy Height:      64 inches Weight:      142 pounds Temp:     97.7 degrees F BP sitting:   110 / 80  (left arm) Cuff size:   regular  Vitals Entered By: Kathrynn Speed CMA (September 30, 2009 2:28 PM) CC: cpx with lab fu/ src   CC:  cpx with lab fu/ src.  History of Present Illness: Kelsey Sparks is a 65 year old, married female, ex-smoker x 1 year comes in today for general physical examination because of underlying hypertension, reflux, esophagitis, allergic rhinitis hyperlipidemia.  Her hypertension is treated with Tenormin 50 mg daily, Maxzide 37.5 -- 25 daily BP 118/80.  Reflux esophagitis history with Nexium 40 q. milligrams daily however, her symptoms have markedly diminished, and she's been off the Nexium.  She takes Nasonex nasal spray for allergic rhinitis.  She uses Zocor 40 mg nightly for hyperlipidemia.  Lipids are at goal.  She uses Flexeril one half tablet p.r.n. for back pain.  She was using Premarin vaginal cream twice daily.  Advised to use it once or twice per week.  She continues to see Dr. Vonna Kotyk, her ophthalmologist because of macular degeneration, again.  She's been off the nicotine for one year.  She had her uterus removed for nonmalignant reasons.  Mammogram March 2011 normal.  Tetanus 2007, seasonal flu 2010, Pneumovax 2010    Preventive Screening-Counseling & Management  Alcohol-Tobacco     Smoking Status: quit  Current Medications (verified): 1)  Tenormin 50 Mg  Tabs (Atenolol) .... Take 1 Tablet By Mouth Every Morning 2)  Maxzide-25 37.5-25 Mg  Tabs (Triamterene-Hctz) .... Take 1 Tablet By Mouth Every Morning 3)  Nexium 40 Mg  Cpdr (Esomeprazole Magnesium) .... Take 1 Tablet By Mouth Every Morning 4)  Zocor 40 Mg  Tabs (Simvastatin) .... Once Daily 5)  Nasonex 50 Mcg/act  Susp (Mometasone Furoate) .... 2 Squirts Two Times A Day 6)  Adult Aspirin Ec Low  Strength 81 Mg  Tbec (Aspirin) .... Once Daily 7)  Aleve 220 Mg  Tabs (Naproxen Sodium) .... As Needed 8)  Flexeril 10 Mg  Tabs (Cyclobenzaprine Hcl) .... 1/2 Three Times A Day As Needed 9)  Zomig Zmt 5 Mg  Tbdp (Zolmitriptan) .... As Needed Ha 10)  Vitamin B-12 Cr 1000 Mcg  Tbcr (Cyanocobalamin) .... Then 1 Cc Monthly 11)  Bd Eclipse Syringe 27g X 1/2" 1 Ml  Misc (Syringe/needle (Disp)) .... Uad 12)  Hydrocodone-Acetaminophen 5-500 Mg Tabs (Hydrocodone-Acetaminophen) .... Take 1-2 Tabs By Mouth Every 8 Hours As Needed 13)  Cyclobenzaprine Hcl 10 Mg Tabs (Cyclobenzaprine Hcl) .... Take One Tab By Mouth Every 8 Hours As Needed 14)  Meclizine Hcl 25 Mg Tabs (Meclizine Hcl) .... Take Half To One Tab By Mouth Every 6 Hours As Needed 15)  Eye Vitamins  Caps (Multiple Vitamins-Minerals) .... Once Daily 16)  Premarin 0.625 Mg/gm Crea (Estrogens, Conjugated) .... Uad  Allergies (verified): No Known Drug Allergies  Past History:  Past medical, surgical, family and social histories (including risk factors) reviewed, and no changes noted (except as noted below).  Past Medical History: Reviewed history from 07/23/2007 and no changes required. Allergic rhinitis Hyperlipidemia Hypertension Low back pain Peripheral vascular disease macular degeneration TAH/BSO, nonmalignant reasons tobacco abuse lumbar disk surgery childbirth x 2 torn cartilage right knee surgical repair pernicious anemia  Past Surgical History: Reviewed history from  06/04/2007 and no changes required. hysterectomy cholecystectomy  Family History: Reviewed history from 06/25/2007 and no changes required. father died from hypertension, arthritis mother has arthritis and asthma, still living 3 brothers in good health two sisters in good health  Social History: Reviewed history from 06/25/2007 and no changes required. Retired Married Alcohol use-no Drug use-no Regular exercise-yes Former Smoker,,,6/10 Smoking  Status:  quit  Review of Systems      See HPI  Physical Exam  General:  Well-developed,well-nourished,in no acute distress; alert,appropriate and cooperative throughout examination Head:  Normocephalic and atraumatic without obvious abnormalities. No apparent alopecia or balding. Eyes:  No corneal or conjunctival inflammation noted. EOMI. Perrla. Funduscopic exam benign, without hemorrhages, exudates or papilledema. Vision grossly normal. Ears:  External ear exam shows no significant lesions or deformities.  Otoscopic examination reveals clear canals, tympanic membranes are intact bilaterally without bulging, retraction, inflammation or discharge. Hearing is grossly normal bilaterally. Nose:  External nasal examination shows no deformity or inflammation. Nasal mucosa are pink and moist without lesions or exudates. Mouth:  Oral mucosa and oropharynx without lesions or exudates.  Teeth in good repair. Neck:  No deformities, masses, or tenderness noted. Chest Wall:  No deformities, masses, or tenderness noted. Breasts:  No mass, nodules, thickening, tenderness, bulging, retraction, inflamation, nipple discharge or skin changes noted.   Lungs:  Normal respiratory effort, chest expands symmetrically. Lungs are clear to auscultation, no crackles or wheezes. Heart:  Normal rate and regular rhythm. S1 and S2 normal without gallop, murmur, click, rub or other extra sounds. Abdomen:  Bowel sounds positive,abdomen soft and non-tender without masses, organomegaly or hernias noted. Rectal:  No external abnormalities noted. Normal sphincter tone. No rectal masses or tenderness. Genitalia:  Pelvic Exam:        External: normal female genitalia without lesions or masses        Vagina: normal without lesions or masses        Cervix: normal without lesions or masses        Adnexa: normal bimanual exam without masses or fullness        Uterus: normal by palpation        Pap smear: not performed Msk:  No  deformity or scoliosis noted of thoracic or lumbar spine.   Pulses:  R and L carotid,radial,femoral,dorsalis pedis and posterior tibial pulses are full and equal bilaterally Extremities:  No clubbing, cyanosis, edema, or deformity noted with normal full range of motion of all joints.   Neurologic:  No cranial nerve deficits noted. Station and gait are normal. Plantar reflexes are down-going bilaterally. DTRs are symmetrical throughout. Sensory, motor and coordinative functions appear intact. Skin:  Intact without suspicious lesions or rashes Cervical Nodes:  No lymphadenopathy noted Axillary Nodes:  No palpable lymphadenopathy Inguinal Nodes:  No significant adenopathy Psych:  Cognition and judgment appear intact. Alert and cooperative with normal attention span and concentration. No apparent delusions, illusions, hallucinations   Impression & Recommendations:  Problem # 1:  HYPERLIPIDEMIA (ICD-272.4) Assessment Improved  Her updated medication list for this problem includes:    Zocor 40 Mg Tabs (Simvastatin) ..... Once daily  Orders: Prescription Created Electronically [phone]) EKG w/ Interpretation ([address])  Problem # 2:  HYPERTENSION (ICD-401.9) Assessment: Improved  Her updated medication list for this problem includes:    Tenormin 50 Mg Tabs (Atenolol) .Marland Kitchen... Take 1 tablet by mouth every morning    Maxzide-25 37.5-25 Mg Tabs (Triamterene-hctz) .Marland Kitchen... Take 1 tablet by mouth every morning  Orders: Prescription Created  Electronically [phone]) EKG w/ Interpretation ([address])  Problem # 3:  GERD (ICD-530.81) Assessment: Improved  Her updated medication list for this problem includes:    Nexium 40 Mg Cpdr (Esomeprazole magnesium) .Marland Kitchen... Take 1 tablet by mouth every morning  Complete Medication List: 1)  Tenormin 50 Mg Tabs (Atenolol) .... Take 1 tablet by mouth every morning 2)  Maxzide-25 37.5-25 Mg Tabs (Triamterene-hctz) .... Take 1 tablet by mouth every morning 3)  Nexium 40 Mg  Cpdr (Esomeprazole magnesium) .... Take 1 tablet by mouth every morning 4)  Zocor 40 Mg Tabs (Simvastatin) .... Once daily 5)  Nasonex 50 Mcg/act Susp (Mometasone furoate) .... 2 squirts two times a day 6)  Adult Aspirin Ec Low Strength 81 Mg Tbec (Aspirin) .... Once daily 7)  Aleve 220 Mg Tabs (Naproxen sodium) .... As needed 8)  Flexeril 10 Mg Tabs (Cyclobenzaprine hcl) .... 1/2 three times a day as needed 9)  Zomig Zmt 5 Mg Tbdp (Zolmitriptan) .... As needed ha 10)  Vitamin B-12 Cr 1000 Mcg Tbcr (Cyanocobalamin) .... Then 1 cc monthly 11)  Bd Eclipse Syringe 27g X 1/2" 1 Ml Misc (Syringe/needle (disp)) .... Uad 12)  Hydrocodone-acetaminophen 5-500 Mg Tabs (Hydrocodone-acetaminophen) .... Take 1-2 tabs by mouth every 8 hours as needed 13)  Meclizine Hcl 25 Mg Tabs (Meclizine hcl) .... Take half to one tab by mouth every 6 hours as needed 14)  Eye Vitamins Caps (Multiple vitamins-minerals) .... Once daily 15)  Premarin 0.625 Mg/gm Crea (Estrogens, conjugated) .... Uad  Patient Instructions: 1)  Please schedule a follow-up appointment in 1 year. 2)  It is important that you exercise regularly at least 20 minutes 5 times a week. If you develop chest pain, have severe difficulty breathing, or feel very tired , stop exercising immediately and seek medical attention. 3)  Schedule your mammogram. 4)  Schedule a colonoscopy/sigmoidoscopy to help detect colon cancer. 5)  Take calcium +Vitamin D daily. 6)  Take an Aspirin every day. Prescriptions: FLEXERIL 10 MG  TABS (CYCLOBENZAPRINE HCL) 1/2 three times a day as needed  #60 x 3   Entered and Authorized by:   Roderick Pee MD   Signed by:   Roderick Pee MD on 09/30/2009   Method used:   Print then Give to Patient   RxID:   [id number] PREMARIN 0.625 MG/GM CREA (ESTROGENS, CONJUGATED) UAD  #PP x 6   Entered and Authorized by:   Roderick Pee MD   Signed by:   Roderick Pee MD on 09/30/2009   Method used:   Print then Give to Patient    RxID:   [id number] NASONEX 50 MCG/ACT  SUSP (MOMETASONE FUROATE) 2 squirts two times a day  #3 x 4   Entered and Authorized by:   Roderick Pee MD   Signed by:   Roderick Pee MD on 09/30/2009   Method used:   [facility]        [facility] [phone]* (retail)       [address]       [place], [place]  [address]       Ph: [phone] or [phone]       Fax: [phone]   RxID:   [id number] ZOCOR 40 MG  TABS (SIMVASTATIN) once daily  #100 x 0   Entered and Authorized by:   Roderick Pee MD   Signed by:   Roderick Pee MD on 09/30/2009   Method used:  [facility]        [facility] [phone]* (retail)       [address] [place]  [address]       Ph: [phone] or [phone]       Fax: [phone]   RxID:   [id number] NEXIUM 40 MG  CPDR (ESOMEPRAZOLE MAGNESIUM) Take 1 tablet by mouth every morning  #100 x 0   Entered and Authorized by:   Roderick Pee MD   Signed by:   Roderick Pee MD on 09/30/2009   Method used:   [facility] to        [facility] [phone]* (retail)       [address] [place]  [address]       Ph: [phone] or [phone]       Fax: [phone]   RxID:   [id number] MAXZIDE-25 37.5-25 MG  TABS (TRIAMTERENE-HCTZ) Take 1 tablet by mouth every morning  #100 x 0   Entered and Authorized by:   Roderick Pee MD   Signed by:   Roderick Pee MD on 09/30/2009   Method used:   [facility] to        [facility] [phone]* (retail)       [address], [place]  [address]       Ph: [phone] or [phone]       Fax: [phone]   RxID:   [id number] TENORMIN 50 MG  TABS (ATENOLOL) Take 1 tablet by mouth every morning  #100 x 4   Entered and Authorized by:   Roderick Pee MD   Signed by:   Roderick Pee MD on 09/30/2009   Method used:   [facility] to        [facility] [phone]* (retail)       [address]       [place], [place]  [address]        Ph: [phone] or [phone]       Fax: [phone]   RxID:   [id number]

## 2010-04-26 NOTE — Progress Notes (Signed)
Summary: REFILL REQUEST  Phone Note Refill Request Message from:  Fax from Pharmacy on September 02, 2009 11:38 AM  Refills Requested: Medication #1:  ZOCOR 40 MG  TABS once daily   [facility].  Medication #2:  NEXIUM 40 MG  CPDR Take 1 tablet by mouth every morning   Notes[facility].  Medication #3:  MAXZIDE-25 37.5-25 MG  TABS Take 1 tablet by mouth every morning   [facility].    Initial call taken by: Debbra Riding,  September 02, 2009 11:39 AM    Prescriptions: ZOCOR 40 MG  TABS (SIMVASTATIN) once daily  #100 x 0   Entered by:   Kern Reap CMA (AAMA)   Authorized by:   Roderick Pee MD   Signed by:   Kern Reap CMA (AAMA) on 09/02/2009   Method used:   [facility] to        [facility] [phone]* (retail)       [address] [place]  [address]       Ph: [phone] or [phone]       Fax: [phone]   RxID:   [id number] NEXIUM 40 MG  CPDR (ESOMEPRAZOLE MAGNESIUM) Take 1 tablet by mouth every morning  #100 x 0   Entered by:   Kern Reap CMA (AAMA)   Authorized by:   Roderick Pee MD   Signed by:   Kern Reap CMA (AAMA) on 09/02/2009   Method used:   [facility] to        [facility] [phone]* (retail)       [address], [place]  [address]       Ph: [phone] or [phone]       Fax: [phone]   RxID:   [id number] CBJSEGB-15 37.5-25 MG  TABS (TRIAMTERENE-HCTZ) Take 1 tablet by mouth every morning  #100 x 0   Entered by:   Kern Reap CMA (AAMA)   Authorized by:   Roderick Pee MD   Signed by:   Kern Reap CMA (AAMA) on 09/02/2009   Method used:   [facility]        [facility] [phone]* (retail)       [address]       [place], [place]  [address]       Ph: [phone] or [phone]       Fax: [phone]   RxID:   [id number]

## 2010-04-26 NOTE — Letter (Signed)
Summary: [facility] Instructions  [facility]  [address] [place], [place] [address]   Phone: [phone]  Fax: [phone]       RAYLI WIEDERHOLD    09-13-1945    MRN: [id number]        Procedure Day /Date: 11/25/09 THURSDAY     Arrival Time:2:30 PM     Procedure Time:3:30 PM     Location of Procedure:                    [facility] (4th Floor)   PREPARATION FOR COLONOSCOPY WITH MOVIPREP/ENDO   Starting 5 days prior to your procedure 11/20/09 do not eat nuts, seeds, popcorn, corn, beans, peas,  salads, or any raw vegetables.  Do not take any fiber supplements (e.g. Metamucil, Citrucel, and Benefiber).  THE DAY BEFORE YOUR PROCEDURE         DATE: 11/24/09  DAY: WEDNESDAY  1.  Drink clear liquids the entire day-NO SOLID FOOD  2.  Do not drink anything colored red or purple.  Avoid juices with pulp.  No orange juice.  3.  Drink at least 64 oz. (8 glasses) of fluid/clear liquids during the day to prevent dehydration and help the prep work efficiently.  CLEAR LIQUIDS INCLUDE: Water Jello Ice Popsicles Tea (sugar ok, no milk/cream) Powdered fruit flavored drinks Coffee (sugar ok, no milk/cream) Gatorade Juice: apple, white grape, white cranberry  Lemonade Clear bullion, consomm, broth Carbonated beverages (any kind) Strained chicken noodle soup Hard Candy                             4.  In the morning, mix first dose of MoviPrep solution:    Empty 1 Pouch A and 1 Pouch B into the disposable container    Add lukewarm drinking water to the top line of the container. Mix to dissolve    Refrigerate (mixed solution should be used within 24 hrs)  5.  Begin drinking the prep at 5:00 p.m. The MoviPrep container is divided by 4 marks.   Every 15 minutes drink the solution down to the next mark (approximately 8 oz) until the full liter is complete.   6.  Follow completed prep with 16 oz of clear liquid of your choice (Nothing red or purple).   Continue to drink clear liquids until bedtime.  7.  Before going to bed, mix second dose of MoviPrep solution:    Empty 1 Pouch A and 1 Pouch B into the disposable container    Add lukewarm drinking water to the top line of the container. Mix to dissolve    Refrigerate  THE DAY OF YOUR PROCEDURE      DATE: 11/25/09 DAY: THURSDAY  Beginning at 10:30 a.m. (5 hours before procedure):         1. Every 15 minutes, drink the solution down to the next mark (approx 8 oz) until the full liter is complete.  2. Follow completed prep with 16 oz. of clear liquid of your choice.    3. You may drink clear liquids until 1:30 PM (2 HOURS BEFORE PROCEDURE).   MEDICATION INSTRUCTIONS  Unless otherwise instructed, you should take regular prescription medications with a small sip of water   as early as possible the morning of your procedure.        OTHER INSTRUCTIONS  You will need a responsible adult at least 63 years of age to  accompany you and drive you home.   This person must remain in the waiting room during your procedure.  Wear loose fitting clothing that is easily removed.  Leave jewelry and other valuables at home.  However, you may wish to bring a book to read or  an iPod/MP3 player to listen to music as you wait for your procedure to start.  Remove all body piercing jewelry and leave at home.  Total time from sign-in until discharge is approximately 2-3 hours.  You should go home directly after your procedure and rest.  You can resume normal activities the  day after your procedure.  The day of your procedure you should not:   Drive   Make legal decisions   Operate machinery   Drink alcohol   Return to work  You will receive specific instructions about eating, activities and medications before you leave.    The above instructions have been reviewed and explained to me by   _______________________    I fully understand and can verbalize these instructions  _____________________________ Date _________

## 2010-04-26 NOTE — Letter (Signed)
Summary: Patient Notice- Polyp Results  [facility]  [address] [place], [place] [address]   Phone: [phone]  Fax: [phone]        December 01, 2009 MRN: [id number]    FRANSHESCA CHIPMAN [address] [place], [place]  [address]    Dear Ms. Diggs,  I am pleased to inform you that the colon polyps removed during your recent colonoscopy were found to be benign (no cancer detected) upon pathologic examination.They were, however, all precancerous adenomas.  Given the number and type of colon polyps,I recommend you have a repeat colonoscopy examination in ONE year to look for recurrent polyps, as having colon polyps increases your risk for having recurrent polyps or even colon cancer in the future.  Also, the random colon biopsies were entirely normal.  Should you develop new or worsening symptoms of abdominal pain, bowel habit changes or bleeding from the rectum or bowels, please schedule an evaluation with either your primary care physician or with me.  Additional information/recommendations:  __ No further action with gastroenterology is needed at this time. Please      follow-up with your primary care physician for your other healthcare      needs.    Please call us if you are having persistent problems or have questions about your condition that have not been fully answered at this time.  Sincerely,  Hilarie Fredrickson MD  This letter has been electronically signed by your physician.  Appended Document: Patient Notice- Polyp Results letter mailed

## 2010-04-26 NOTE — Consult Note (Signed)
Summary: [facility]   Imported By: Sherian Rein 10/26/2009 10:56:15  _____________________________________________________________________  External Attachment:    Type:   Image     Comment:   External Document

## 2010-04-26 NOTE — Procedures (Signed)
Summary: Upper Endoscopy  Patient: [name] Note: All result statuses are Final unless otherwise noted.  Tests: (1) Upper Endoscopy (EGD)   EGD Upper Endoscopy       DONE     [facility]     [address]     [place], [place]  [address]           ENDOSCOPY PROCEDURE REPORT           PATIENT:  Kelsey Sparks, Kelsey Sparks  MR#:  [id number]     BIRTHDATE:  1945/04/04, 64 yrs. old  GENDER:  female           ENDOSCOPIST:  Wilhemina Bonito. Eda Keys, MD     Referred by:  Office           PROCEDURE DATE:  11/25/2009     PROCEDURE:  EGD, diagnostic,     Maloney Dilation of Esophagus -20F     ASA CLASS:  Class II     INDICATIONS:  dysphagia, foreign body           MEDICATIONS:   There was residual sedation effect present from     prior procedure., Versed 3 mg IV     TOPICAL ANESTHETIC:  Exactacain Spray           DESCRIPTION OF PROCEDURE:   After the risks benefits and     alternatives of the procedure were thoroughly explained, informed     consent was obtained.  The LB GIF-H180 [id number] endoscope was     introduced through the mouth and advanced to the second portion of     the duodenum, without limitations.  The instrument was slowly     withdrawn as the mucosa was fully examined.     <<PROCEDUREIMAGES>>           A large caliber ring-like stricture was found in the distal     esophagus.The esophaus was otherwise normal.  The stomach was     entered and closely examined. The antrum, angularis, and lesser     curvature were well visualized, including a retroflexed view of     the cardia and fundus. The stomach wall was normally distensable.     The scope passed easily through the pylorus into the duodenum.     The duodenal bulb was normal in appearance, as was the postbulbar     duodenum.  Normal ampulla.    Retroflexed views revealed a small     hiatal hernia.    The scope was then withdrawn from the patient     and the procedure completed.           THERAPY; 54 F MALONEY DILATOR  PASSED W/O RESISTANCE OR HEME.     TOLERATED WELL           COMPLICATIONS:  None           ENDOSCOPIC IMPRESSION:     1) Stricture in the distal esophagus - S/P 20F DILATION     2) Normal stomach     3) Normal duodenum     4) Normal ampulla     5) A small hiatal hernia     6) Gerd     RECOMMENDATIONS:     1) continue PPI (Aciphex)     2) Anti-reflux regimen to be followed     3) Clear liquids until 6 pm, then soft foods rest of day. Resume     prior diet tomorrow.  4) Call office next 2-3 days to schedule an office appointment     for 6 weeks           ______________________________     Wilhemina Bonito. Eda Keys, MD           CC:  The Patient, Roderick Pee, MD           n.     Rosalie DoctorWilhemina Bonito. Eda Keys at 11/25/2009 03:45 PM           Montel Clock, [id number]  Note: An exclamation mark (!) indicates a result that was not dispersed into the flowsheet. Document Creation Date: 11/25/2009 3:46 PM _______________________________________________________________________  (1) Order result status: Final Collection or observation date-time: 11/25/2009 15:37 Requested date-time:  Receipt date-time:  Reported date-time:  Referring Physician:   Ordering Physician: Fransico Setters [phone]) Specimen Source:  Source: Launa Grill Order Number: [id number] Lab site:

## 2010-04-26 NOTE — Progress Notes (Signed)
Summary: [facility]   Imported By: Sherian Rein 10/26/2009 11:03:17  _____________________________________________________________________  External Attachment:    Type:   Image     Comment:   External Document

## 2010-04-26 NOTE — Progress Notes (Signed)
Summary: Triage  Phone Note Call from Patient Call back at [phone]   Caller: Patient Call For: Dr. Marina Goodell Reason for Call: Talk to Nurse Summary of Call: Wants to know if she can switch from Nexium to Aciphex. Does not feel like Nexium is working. She is having abd pain and nausea Initial call taken by: Karna Christmas,  September 30, 2009 4:10 PM  Follow-up for Phone Call        Pt. saw Dr.Todd today and daily nexium re-ordered by him.Pt. has appt. with Dr.Perry for 10/27/09.Since she has not been seen by Dr.Perry for 3 years I suggested she call Dr.Todd back to see if he wants to increase nexium or change med. till  eval.appt with Dr.Perry as she has been having PPI re-filled by him.Says the problem is still the same so why can't you just change it and she hung up the phone. Follow-up by: Teryl Lucy RN,  September 30, 2009 4:38 PM

## 2010-04-26 NOTE — Procedures (Signed)
Summary: EGD   EGD  Procedure date:  05/31/2000  Findings:      Location: [facility]  Findings: Stricture:  Hiatal Hernia  EGD  Procedure date:  05/31/2000  Findings:      Location: [facility]  Findings: Stricture:  Hiatal Hernia Patient Name: Kelsey Sparks, Artz MRN: [id number] Procedure Procedures: Panendoscopy (EGD) CPT: 43235.    with esophageal dilation. CPT: G9296129.  Personnel: Endoscopist: Wilhemina Bonito. Marina Goodell, MD.  Exam Location: Exam performed in Outpatient Clinic. Outpatient  Patient Consent: Procedure, Alternatives, Risks and Benefits discussed, consent obtained, from patient.  Indications Symptoms: Dysphagia. Reflux symptoms  History  Pre-Exam Physical: Performed May 31, 2000  Cardio-pulmonary exam, HEENT exam, Abdominal exam, Extremity exam, Neurological exam, Mental status exam WNL.  Exam Exam Info: Maximum depth of insertion Duodenum, intended Duodenum. Patient position: on left side. Vocal cords visualized. Gastric retroflexion performed. Images were not taken. ASA Classification: I. Tolerance: excellent.  Sedation Meds: Fentanyl 50 mcg. Versed 5 mg. Cetacaine Spray  Monitoring: BP and pulse monitoring done. Oximetry used. Supplemental O2 given  Findings HIATAL HERNIA: Regular, ICD9: Hernia, Hiatal: 553.3.Comments: small.  STRICTURE / STENOSIS: Stricture in Distal Esophagus.  Etiology: benign due to reflux. 35 cm from mouth. ICD9: Esophageal Stricture: 530.3.  - Dilation: Distal Esophagus. Maloney dilator used, Diameter: 18 mm, No Resistance, No Heme present on extraction. Patient tolerance excellent. Outcome: successful.   Assessment Abnormal examination, see findings above.  Diagnoses: 530.3: Esophageal Stricture.  553.3: Hernia, Hiatal.   Events  Unplanned Intervention: No unplanned interventions were required.  Unplanned Events: There were no complications. Plans Instructions: Nothing to eat or drink for  2hrs.  Clear or full liquids: 2 hrs. Resume previous diet: am.  Medication(s): PPI: Esomeprazole/Nexium 40 mg QD,   Patient Education: Patient given standard instructions for: Reflux. Stenosis / Stricture.  Disposition: After procedure patient sent to recovery. After recovery patient sent home.  Scheduling: Clinic Visit, to Wilhemina Bonito. Marina Goodell, MD, in about 2 months    This report was created from the original endoscopy report, which was reviewed and signed by the above listed endoscopist.   cc:  Kelle Darting, MD

## 2010-04-26 NOTE — Consult Note (Signed)
Summary: [facility]   Imported By: Sherian Rein 10/26/2009 10:58:47  _____________________________________________________________________  External Attachment:    Type:   Image     Comment:   External Document

## 2010-04-26 NOTE — Assessment & Plan Note (Signed)
Summary: skin tear//ccm   Vital Signs:  Patient profile:   65 year old female Menstrual status:  hysterectomy Height:      64 inches Weight:      145 pounds BMI:     24.98 Temp:     98.1 degrees F oral BP sitting:   120 / 88  (left arm) Cuff size:   regular  Vitals Entered By: Kern Reap CMA Duncan Dull) (September 09, 2009 11:56 AM) CC: vaginal tear   CC:  vaginal tear.  History of Present Illness: Kelsey Sparks is a 65 year old female, who comes in today for evaluation of a tear in her vagina.  She describes no history of trauma.  She has been using Premarin vaginal cream once weekly for vaginal dryness  Allergies: No Known Drug Allergies  Past History:  Past medical, surgical, family and social histories (including risk factors) reviewed for relevance to current acute and chronic problems.  Past Medical History: Reviewed history from 07/23/2007 and no changes required. Allergic rhinitis Hyperlipidemia Hypertension Low back pain Peripheral vascular disease macular degeneration TAH/BSO, nonmalignant reasons tobacco abuse lumbar disk surgery childbirth x 2 torn cartilage right knee surgical repair pernicious anemia  Past Surgical History: Reviewed history from 06/04/2007 and no changes required. hysterectomy cholecystectomy  Family History: Reviewed history from 06/25/2007 and no changes required. father died from hypertension, arthritis mother has arthritis and asthma, still living 3 brothers in good health two sisters in good health  Social History: Reviewed history from 06/25/2007 and no changes required. Retired Married Current Smoker Alcohol use-no Drug use-no Regular exercise-yes  Review of Systems      See HPI  Physical Exam  General:  Well-developed,well-nourished,in no acute distress; alert,appropriate and cooperative throughout examination Genitalia:  extension and today within normal limits for age.  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 Uad  Complete Medication List: 1)  Tenormin 50 Mg Tabs (Atenolol) .... Take 1 tablet by mouth every morning 2)  Maxzide-25 37.5-25 Mg Tabs (Triamterene-hctz) .... Take 1 tablet by mouth every morning 3)  Nexium 40 Mg Cpdr (Esomeprazole magnesium) .... Take 1 tablet by mouth every morning 4)  Zocor 40 Mg Tabs (Simvastatin) .... Once daily 5)  Nasonex 50 Mcg/act Susp (Mometasone furoate) .... 2 squirts two times a day 6)  Adult Aspirin Ec Low Strength 81 Mg Tbec (Aspirin) .... Once daily 7)  Aleve 220 Mg Tabs (Naproxen sodium) .... As needed 8)  Flexeril 10 Mg Tabs (Cyclobenzaprine hcl) .... 1/2 three times a day as needed 9)  Zomig Zmt 5 Mg Tbdp (Zolmitriptan) .... As needed ha 10)  Vitamin B-12 Cr 1000 Mcg Tbcr (Cyanocobalamin) .... Then 1 cc monthly 11)  Bd Eclipse Syringe 27g X 1/2" 1 Ml Misc (Syringe/needle (disp)) .... Uad 12)  Hydrocodone-acetaminophen 5-500 Mg Tabs (Hydrocodone-acetaminophen) .... Take 1-2 tabs by mouth every 8 hours as needed 13)  Cyclobenzaprine Hcl 10 Mg Tabs (Cyclobenzaprine hcl) .... Take one tab by mouth every 8 hours as needed 14)  Meclizine Hcl 25 Mg Tabs (Meclizine hcl) .... Take half to one tab by mouth every 6 hours as needed 15)  Eye Vitamins Caps (Multiple vitamins-minerals) .... Once daily 16)  Premarin 0.625 Mg/gm Crea (Estrogens, conjugated) .... Uad  Patient Instructions: 1)  apply small amounts of the Premarin vaginal cream  daily x 2 weeks, then twice weekly Prescriptions: PREMARIN 0.625 MG/GM CREA (ESTROGENS, CONJUGATED) UAD  #PP x 3   Entered and Authorized by:   Roderick Pee MD   Signed by:   Roderick Pee MD on 09/09/2009   Method used:   Print then Give to Patient   RxID:   [id number]

## 2010-04-28 NOTE — Progress Notes (Signed)
Summary: nausea  Phone Note Call from Patient   Caller: Patient Call For: Roderick Pee MD Summary of Call: Hydromet is making her nauseated and [phone] Initial call taken by: [facility] [place] [facility] CMA AAMA,  April 20, 2010 9:55 AM  Follow-up for Phone Call        stop the Hydromet......... Tessalon 100 mg, dispense 40, directions one q.i.d. p.r.n. cough one refill Follow-up by: Roderick Pee MD,  April 20, 2010 12:05 PM  Additional Follow-up for Phone Call Additional follow up Details #1::        Rx Called In. patient is aware Additional Follow-up by: Kern Reap CMA Duncan Dull),  April 20, 2010 12:24 PM

## 2010-04-28 NOTE — Letter (Signed)
Summary: [facility]  [facility]   Imported By: Lanelle Bal 03/12/2010 09:56:42  _____________________________________________________________________  External Attachment:    Type:   Image     Comment:   External Document

## 2010-04-28 NOTE — Assessment & Plan Note (Signed)
Summary: cough/dm   Vital Signs:  Patient profile:   65 year old female Menstrual status:  hysterectomy Weight:      148 pounds Temp:     98.0 degrees F oral BP sitting:   110 / 76  (left arm)  Vitals Entered By: Kern Reap CMA Duncan Dull) (April 18, 2010 12:28 PM) CC: cough,  chest congestion   Primary Care Provider:  Kelle Darting, MD  CC:  cough and chest congestion.  History of Present Illness: Kelsey Sparks is a 65 year old, married female ex smoker........ date June 2010........ who comes in today for a cold and cough x 1 week.  No fever, earache, sputum production, etc.  Allergies: No Known Drug Allergies  Past History:  Past medical, surgical, family and social histories (including risk factors) reviewed for relevance to current acute and chronic problems.  Past Medical History: Reviewed history from 07/23/2007 and no changes required. Allergic rhinitis Hyperlipidemia Hypertension Low back pain Peripheral vascular disease macular degeneration TAH/BSO, nonmalignant reasons tobacco abuse lumbar disk surgery childbirth x 2 torn cartilage right knee surgical repair pernicious anemia  Past Surgical History: Reviewed history from 10/25/2009 and no changes required. hysterectomy cholecystectomy Back Surgery Knee Arthroscopy-Right Neck Surgery  Family History: Reviewed history from 10/25/2009 and no changes required. father died from hypertension, arthritis mother has arthritis and asthma, still living 3 brothers in good health two sisters in good health No FH of Colon Cancer: Family History of Breast Cancer: Mat Aunt  Social History: Reviewed history from 02/08/2010 and no changes required. Retired Married Alcohol use-no Drug use-no Former Smoker-stopped 6/10 Patient gets regular exercise.-yes  Review of Systems      See HPI  Physical Exam  General:  Well-developed,well-nourished,in no acute distress; alert,appropriate and cooperative throughout  examination Head:  Normocephalic and atraumatic without obvious abnormalities. No apparent alopecia or balding. Eyes:  No corneal or conjunctival inflammation noted. EOMI. Perrla. Funduscopic exam benign, without hemorrhages, exudates or papilledema. Vision grossly normal. Ears:  External ear exam shows no significant lesions or deformities.  Otoscopic examination reveals clear canals, tympanic membranes are intact bilaterally without bulging, retraction, inflammation or discharge. Hearing is grossly normal bilaterally. Nose:  External nasal examination shows no deformity or inflammation. Nasal mucosa are pink and moist without lesions or exudates. Mouth:  Oral mucosa and oropharynx without lesions or exudates.  Teeth in good repair. Neck:  No deformities, masses, or tenderness noted. Chest Wall:  No deformities, masses, or tenderness noted. Lungs:  Normal respiratory effort, chest expands symmetrically. Lungs are clear to auscultation, no crackles or wheezes.   Problems:  Medical Problems Added: 1)  Dx of Viral Infection-unspec  (ICD-079.99)  Impression & Recommendations:  Problem # 1:  VIRAL INFECTION-UNSPEC (ICD-079.99) Assessment New  Her updated medication list for this problem includes:    Hydromet 5-1.5 Mg/70ml Syrp (Hydrocodone-homatropine) .Marland Kitchen... 1/2 to 1 tsp three times a day as needed  Complete Medication List: 1)  Tenormin 50 Mg Tabs (Atenolol) .... Take 1 tablet by mouth every morning 2)  Maxzide-25 37.5-25 Mg Tabs (Triamterene-hctz) .... Take 1 tablet by mouth every morning 3)  Zocor 40 Mg Tabs (Simvastatin) .... Once daily 4)  Nasonex 50 Mcg/act Susp (Mometasone furoate) .... 2 squirts two times a day 5)  Flexeril 10 Mg Tabs (Cyclobenzaprine hcl) .... 1/2 three times a day as needed 6)  Zomig Zmt 5 Mg Tbdp (Zolmitriptan) .... As needed ha 7)  Hydrocodone-acetaminophen 5-500 Mg Tabs (Hydrocodone-acetaminophen) .... Take 1-2 tabs by mouth every 8 hours as  needed 8)   Meclizine Hcl 25 Mg Tabs (Meclizine hcl) .... Take half to one tab by mouth every 6 hours as needed 9)  Eye Vitamins Caps (Multiple vitamins-minerals) .... Once daily 10)  Premarin 0.625 Mg/gm Crea (Estrogens, conjugated) .... Uad 11)  Levsin/sl 0.125 Mg Subl (Hyoscyamine sulfate) .... Use as directed 12)  Aciphex 20 Mg Tbec (Rabeprazole sodium) .... Take 1 by mouth once daily 13)  Hydromet 5-1.5 Mg/43ml Syrp (Hydrocodone-homatropine) .... 1/2 to 1 tsp three times a day as needed  Patient Instructions: 1)  drink 30 ounces of water daily. 2)  Vaporizer in your bedroom at night. 3)  Hydromet one half to 1 teaspoon up to 3 times a day p.r.n. cough 4)  Please schedule a follow-up appointment as needed. Prescriptions: HYDROMET 5-1.5 MG/5ML SYRP (HYDROCODONE-HOMATROPINE) 1/2 to 1 tsp three times a day as needed  #8oz x 1   Entered and Authorized by:   Roderick Pee MD   Signed by:   Roderick Pee MD on 04/18/2010   Method used:   Print then Give to Patient   RxID:   [id number]    Orders Added: 1)  Est. Patient Level III [78469]

## 2010-05-12 ENCOUNTER — Other Ambulatory Visit: Payer: Self-pay | Admitting: Family Medicine

## 2010-06-20 ENCOUNTER — Other Ambulatory Visit: Payer: Self-pay | Admitting: Internal Medicine

## 2010-06-21 NOTE — Telephone Encounter (Signed)
Rx Sent to pharmacy.  

## 2010-07-04 LAB — CBC
MCHC: 33.8 g/dL (ref 30.0–36.0)
MCV: 97 fL (ref 78.0–100.0)
RBC: 4.13 MIL/uL (ref 3.87–5.11)
RDW: 13.8 % (ref 11.5–15.5)

## 2010-07-04 LAB — URINALYSIS, ROUTINE W REFLEX MICROSCOPIC
Glucose, UA: NEGATIVE mg/dL
Ketones, ur: NEGATIVE mg/dL
pH: 7 (ref 5.0–8.0)

## 2010-07-04 LAB — DIFFERENTIAL
Lymphocytes Relative: 19 % (ref 12–46)
Lymphs Abs: 1.5 10*3/uL (ref 0.7–4.0)
Neutrophils Relative %: 72 % (ref 43–77)

## 2010-07-04 LAB — COMPREHENSIVE METABOLIC PANEL
AST: 17 U/L (ref 0–37)
CO2: 23 mEq/L (ref 19–32)
Calcium: 10.2 mg/dL (ref 8.4–10.5)
Creatinine, Ser: 1.17 mg/dL (ref 0.40–1.20)
GFR calc Af Amer: 57 mL/min — ABNORMAL LOW (ref >60)
GFR calc non Af Amer: 47 mL/min — ABNORMAL LOW (ref >60)
Glucose, Bld: 117 mg/dL — ABNORMAL HIGH (ref 70–99)

## 2010-08-09 NOTE — H&P (Signed)
NAME:  Kelsey Sparks, Kelsey Sparks NO.:  [id number]   MEDICAL RECORD NO.:  [id number]          PATIENT TYPE:  INP   LOCATION:  3011                         FACILITY:  [facility]   PHYSICIAN:  Payton Doughty, M.D.      DATE OF BIRTH:  [birth year]   DATE OF ADMISSION:  09/15/2008  DATE OF DISCHARGE:                              HISTORY & PHYSICAL   ADMITTING DIAGNOSIS:  Spondylosis, C6-C7.   BODY OF THE TEXT:  This is a 65 year old self-referred right-handed  black lady who has been having neck pain off and on for awhile, visited  with orthopedic doctors, had an MR.  She had a couple of epidurals, did  not seem to do much.  Some spasms in her neck but mostly bothering her  down her left arm.  I visited her in my office with an MR that shows  spondylitic change with most prominent C6-C7 of the left side and disk  material on the left C7 neural foramen.  She is now admitted for an  anterior decompression and fusion at C6-C7.   PAST MEDICAL HISTORY:  Remarkable for osteoarthritis and hypertension.   She takes triamterene and hydrochlorothiazide, atenolol, fexofenadine,  simvastatin, Nexium, Celebrex, Flexeril, __________, aspirin, Aleve,  Nasonex, and __________.  She does not take Celebrex and Aleve at the  same time.   ALLERGIES:  None.   SURGICAL HISTORY:  Lumbar disk in 1981 by Dr. Tresa Res, hysterectomy in  1991, and right knee operation in 1996.   SOCIAL HISTORY:  She smokes 5 cigarettes a day.  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   FAMILY HISTORY:  Mom is 99 in good health, she had a small stroke.  Father is deceased, he had hypertension.   REVIEW OF SYSTEMS:  Remarkable for night sweats, wearing glasses, nasal  congestion, sinus problems, hypertension, hypercholesterolemia, swelling  in her hands and feet, leg pain, abdominal pain, endometriosis, back  pain, arm pain, leg pain, joint pain, arthritis, and neck pain.   PHYSICAL EXAMINATION:   HEENT:  Within normal limits.  NECK:  She has reasonable range of motion of neck without Lhermitte.  CHEST:  Clear.  CARDIAC:  Regular rate and rhythm.  ABDOMEN:  Nontender with no hepatosplenomegaly.  EXTREMITIES:  Without clubbing, cyanosis.  GU:  Deferred.  Peripheral pulses are good.  NEUROLOGIC:  She is awake, alert, and oriented.  Cranial nerves are  intact.  Motor exam shows 5/5 strength throughout the upper extremities  save for slight give-way weakness of left triceps.  Sensory dysesthesias  described in the C7 distribution on the left.  Reflexes are 2 at the  biceps, 2 at the right triceps, 1 at the left triceps, 1 at the  brachioradialis bilaterally.  MR demonstrates spondylitic change at  several levels, most prominent at C6-C7 of the left side where there is  uncovertebral hypertrophy as well as disk material and left C7 neural  foramen.   CLINICAL IMPRESSION:  Left C7 radiculopathy related to disk at C6-C7.   PLAN:  An anterior decompression and fusion at  C6-C7.  The risks and  benefits have been discussed with her and she wished to proceed.           ______________________________  Payton Doughty, M.D.     MWR/MEDQ  D:  09/15/2008  T:  09/15/2008  Job:  [id number]

## 2010-08-09 NOTE — Assessment & Plan Note (Signed)
[facility]                         GASTROENTEROLOGY OFFICE NOTE   Kelsey, Sparks                    MRN:          [id number]  DATE:12/27/2006                            DOB:          [birth year]    HISTORY:  Kelsey Sparks presents today for followup.  She is a 65 year old with  gastroesophageal reflux disease complicated by peptic stricture and  irritable bowel syndrome.  She was evaluated in the office November 13, 2006 for worsening reflux symptoms and dysphagia.  See that dictation  for details.  Her Nexium was increased to b.i.d. for 3-4 weeks.  She  also underwent upper endoscopy, November 15, 2006.  No obvious stricture.  However, she was dilated with a 54-French Maloney dilator.  She presents  today for followup.  After completing a several week course of b.i.d.  Nexium, she is currently on Nexium 40 mg once daily.  Her reflux  symptoms have improved significantly.  Her dysphagia has resolved.  No  other interval issues.  Her irritable bowel is unchanged.   Her medications are also unchanged from her last office visit.   PHYSICAL EXAMINATION:  A well-appearing female in no acute distress.  Blood pressure is 98/74, heart rate is 72, weight is 140.5 pounds.  ABDOMEN:  Soft without tenderness, masses, or hernia.   IMPRESSION:  1. Gastroesophageal reflux disease complicated by peptic stricture.      Currently asymptomatic post dilation on Nexium.  2. Irritable bowel, stable.   RECOMMENDATIONS:  1. Reflux precautions.  2. Continue Nexium.  3. Ongoing general medical care with Dr. Tawanna Cooler.     Wilhemina Bonito. Marina Goodell, MD  Electronically Signed    JNP/MedQ  DD: 12/27/2006  DT: 12/27/2006  Job #: [id number]   cc:   Tinnie Gens A. Tawanna Cooler, MD

## 2010-08-09 NOTE — Assessment & Plan Note (Signed)
[facility]                         GASTROENTEROLOGY OFFICE NOTE   Kelsey Sparks, Kelsey Sparks                    MRN:          [id number]  DATE:11/13/2006                            DOB:          [birth year]    REFERRING PHYSICIAN:  Tinnie Gens A. Tawanna Cooler, MD   OFFICE CONSULTATION NOTE   REASON FOR CONSULTATION:  Breakthrough reflux and dysphagia.   HISTORY:  This is a 65 year old female with a history of irritable bowel  syndrome and gastroesophageal reflux disease complicated by peptic  stricture, for which she has undergone prior esophageal dilation.  She  is also status post cholecystectomy.  She was last evaluated in this  office October 21, 2003 regarding right back, flank, and abdominal pain,  which was felt to be musculoskeletal.  Her last upper endoscopy was  performed in March of 2005 when she underwent esophageal dilation to a  maximal dilation of 18 mm for problems with dysphagia.  She states that  this dilation was helpful.  She has been on Nexium chronically with good  control of reflux symptoms until about the past month.  Over the past  month, she has had regurgitation, as well as some recurrent heartburn.  Her weight has been stable.  She has also had some intermittent  dysphagia to solids and occasionally to liquids.  This has resulted in  some chest discomfort.  She continues with chronic abdominal bloating,  intermittent loose stools, consistent with her irritable bowel syndrome.  No bleeding.  She did undergo colonoscopy in 2002.  This was negative,  except for mild diverticulosis.   PAST MEDICAL HISTORY:  1. Hypertension.  2. Dyslipidemia.  3. Arthritis.  4. Gastroesophageal reflux disease complicated by peptic stricture.  5. Irritable bowel syndrome.   PAST SURGICAL HISTORY:  1. Hysterectomy.  2. Cholecystectomy.   ALLERGIES:  No known drug allergies.   CURRENT MEDICATIONS:  1. Celebrex 200 mg daily.  2. Zocor 20 mg daily.  3.  Nasonex daily.  4. Baby aspirin.  5. Allegra.  6. Potassium chloride 20 mEq daily.  7. Nexium 40 mg daily.  8. Maxzide 25 mg daily.  9. Metoprolol 50 mg daily.  10.Zolmitriptan 2.5 mg p.r.n.  11.She also uses Levsin p.r.n.   FAMILY HISTORY:  Brother with colon polyps.  Father with heart disease.   SOCIAL HISTORY:  The patient is married with 2 daughters.  She lives  with her husband.  She is a Engineer, agricultural.  She works as a Building control surveyor.  She smokes.  Does not use alcohol.   REVIEW OF SYSTEMS:  Per diagnostic evaluation form.   PHYSICAL EXAM:  Well-appearing female in no acute distress.  Blood pressure is 112/64, heart rate is 84, weight 139 pounds.  She is 5  feet 7 inches in height.  HEENT:  Sclerae anicteric.  Conjunctivae pink.  Oral mucosa is intact.  No adenopathy.  LUNGS:  Clear.  HEART:  Regular.  ABDOMEN:  Soft without tenderness, mass, or hernia.  Good bowel sounds  heard.  EXTREMITIES:  Without edema.   IMPRESSION:  1. Gastroesophageal reflux disease with  recent breakthrough symptoms.  2. Recurrent dysphagia likely due to recurrent peptic stricture.  3. Chronic irritable bowel syndrome with gas, bloating, and loose      stools.  Unchanged.   RECOMMENDATIONS:  1. Increase Nexium to 40 mg b.i.d. for 3 to 4 weeks to see if this      helps.  2. Schedule upper endoscopy with esophageal dilation.  The nature of      the procedure, as well as the risks, benefits, and alternatives      have been reviewed.  She understood and agreed to proceed.  3. Repeat screening colonoscopy in the next couple of years.      Discussed with the patient.     Wilhemina Bonito. Marina Goodell, MD  Electronically Signed    JNP/MedQ  DD: 11/13/2006  DT: 11/14/2006  Job #: [id number]   cc:   Tinnie Gens A. Tawanna Cooler, MD

## 2010-08-09 NOTE — Op Note (Signed)
NAME:  Kelsey Sparks, CASTELLI NO.:  [id number]   MEDICAL RECORD NO.:  [id number]          PATIENT TYPE:  INP   LOCATION:  3011                         FACILITY:  [facility]   PHYSICIAN:  Payton Doughty, M.D.      DATE OF BIRTH:  1945/04/18   DATE OF PROCEDURE:  09/15/2008  DATE OF DISCHARGE:                               OPERATIVE REPORT   PREOPERATIVE DIAGNOSIS:  Herniated disk and spondylosis at C6-7.   POSTOPERATIVE DIAGNOSIS:  Herniated disk and spondylosis at C6-7.   PROCEDURE:  C6-7 anterior cervical decompression and fusion with a  Reflex hybrid plate.   SURGEON:  Payton Doughty, MD   ANESTHESIA:  General endotracheal.   PREPARATION:  Prepped and draped with alcohol wipe.   COMPLICATIONS:  None.   NURSE ASSISTANT:  Bedelia Person, MD   DOCTOR ASSISTANT:  Danae Orleans. Venetia Maxon, MD.   BODY OF TEXT:  A 65 year old lady with left C7 radiculopathy, disk at 6-  7, taken to the operating room, smoothly anesthetized and intubated,  placed supine on the operating table in the halter head traction.  Following shave, prep, and drape in usual sterile fashion, skin was  incised in midline medial border to the medial border of  sternocleidomastoid muscle on the left side.  The platysma was  identified, elevated, divided, and undermined.  Sternocleidomastoid was  identified.  Medial dissection revealed the carotid artery, tracked  laterally to the left.  Trachea and esophagus tracked laterally to the  right, exposing the bones of the anterior cervical spine.  Marker was  placed.  Intraoperative x-ray obtained and confirm correctness of level.  Having confirmed correctness of level, diskectomy was carried out under  gross observation.  We then brought in the operating microscope and used  microdissection technique to remove the remaining disks, divide the  posterior annulus, explore the neural foramen, and divide the posterior  longitudinal ligament.  Following complete decompression and  exploration  of neural foramen, wound was irrigated and hemostasis assured.  An 8-mm  bone graft was fashioned from patellar allograft and tapped into place.  A 16-mm Reflex hybrid plate was placed with 12-mm screws, two at C6 and  two at C7, and intraoperative x-ray showed good placement of bone graft,  plate, and screws.  The wound was closed in successive  layers of 3-0 Vicryl and 4-0 Vicryl.  Benzoin and Steri-Strips were  placed, made occlusive with Telfa and OpSite.  Prior to closure, the  esophagus was carefully inspected and found to be free of lesions.  The  patient was placed in Aspen collar and returned to recovery room in good  condition.           ______________________________  Payton Doughty, M.D.     MWR/MEDQ  D:  09/15/2008  T:  09/15/2008  Job:  [id number]

## 2010-08-12 NOTE — Op Note (Signed)
[facility]  Patient:    SELENY, ALLBRIGHT                    MRN: [id number] Proc. Date: 10/31/00 Adm. Date:  [id number] Attending:  Kandis Mannan CC:         Tera Mater. Clent Ridges, M.D.  Jeralyn Bennett, M.D.   Operative Report  CCS NUMBER:  [id number]  PREOPERATIVE DIAGNOSIS:  Chronic cholecystitis with cholelithiasis.  POSTOPERATIVE DIAGNOSIS:  Chronic cholecystitis with cholelithiasis.  OPERATION:  Laparoscopic cholecystectomy.  SURGEON:  Maisie Fus B. Samuella Cota, M.D.  ASSISTANT:  Gita Kudo, M.D.  ANESTHESIA:  General.  ANESTHESIOLOGIST:  Lestine Box, M.D. and CRNA.  DESCRIPTION OF PROCEDURE:  The patient was taken to the operating room and placed on the table in the supine position and after satisfactory general anesthetic with intubation, the entire abdomen was prepped and draped as a sterile field.  A small vertical infraumbilical incision was made through skin and subcutaneous tissue and midline fascia.  A finger was placed into the peritoneal cavity.  A pursestring suture of 0 Vicryl was placed and the Hasson trocar placed in the abdomen, the abdomen insufflated to 14 mmHg pressure.  A second 10 mm trocar was placed just to the right of midline in the subxiphoid area.  Two 5 mm trocars were placed laterally.  The gallbladder was visualized and had adhesions that extended up about two-thirds of the way of the gallbladder.  The gallbladder was quite large and extended beyond the edge of the liver.  The adhesions were taken down, and the area of the cystic duct and cystic artery was dissected.  The cystic duct was readily identified as it was going into the gallbladder.  The cystic duct was fairly long.  The cystic duct was triply clipped on the remaining side, once on the gallbladder side and divided.  This gave better exposure to the cystic artery which was dissected free, triply clipped on the remaining side and once on the gallbladder  side and divided.  The gallbladder was then dissected from the bed using the cautery.  There was another small vessel in the gallbladder bed, and this was doubly clipped.  The gallbladder was freed-up and hemostasis obtained in the gallbladder bed.  The gallbladder was then brought to the infraumbilical incision where it was delivered without difficulty.  The right upper quadrant was irrigated with saline.  Hemostasis was obtained.  The two lateral trocars were removed under direct vision.  The pursestring suture at the infraumbilical incision was tied to give good closure of the fascia.  The abdomen was deflated and the second 10 mm trocar removed.  Marcaine 0.25% without epinephrine had been injected at all trocar sites.  The two midline incisions were closed with running subcuticular sutures of 4-0 Vicryl, and the two lateral trocar sites were closed with single simple subcuticular 4-0 Vicryl.  Benzoin and half-inch Steri-Strips were used to reinforce the skin closure.  Dry sterile dressing was applied.  The patient seemed to tolerate the procedure well and was taken to the PACU in satisfactory condition. DD:  10/31/00 TD:  10/31/00 Job: [id number]

## 2010-08-31 ENCOUNTER — Other Ambulatory Visit: Payer: Self-pay | Admitting: Family Medicine

## 2010-09-01 ENCOUNTER — Other Ambulatory Visit: Payer: Self-pay | Admitting: Family Medicine

## 2010-09-07 ENCOUNTER — Other Ambulatory Visit: Payer: Self-pay | Admitting: Family Medicine

## 2010-09-09 ENCOUNTER — Other Ambulatory Visit: Payer: Self-pay | Admitting: Family Medicine

## 2010-09-09 NOTE — Telephone Encounter (Signed)
Pt is completely out of bp med

## 2010-09-09 NOTE — Telephone Encounter (Signed)
Pharmacy stated the rx was never received; rx called in and pt is aware.

## 2010-09-09 NOTE — Telephone Encounter (Signed)
Pt is completely out of med.

## 2010-09-20 ENCOUNTER — Other Ambulatory Visit (INDEPENDENT_AMBULATORY_CARE_PROVIDER_SITE_OTHER): Payer: 59

## 2010-09-20 DIAGNOSIS — Z0000 Encounter for general adult medical examination without abnormal findings: Secondary | ICD-10-CM

## 2010-09-20 LAB — LIPID PANEL
HDL: 48.1 mg/dL (ref >39.00)
Total CHOL/HDL Ratio: 4

## 2010-09-20 LAB — HEPATIC FUNCTION PANEL
AST: 18 U/L (ref 0–37)
Alkaline Phosphatase: 72 U/L (ref 39–117)
Total Bilirubin: 0.5 mg/dL (ref 0.3–1.2)

## 2010-09-20 LAB — POCT URINALYSIS DIPSTICK
Bilirubin, UA: NEGATIVE
Blood, UA: NEGATIVE
Glucose, UA: NEGATIVE
Nitrite, UA: NEGATIVE

## 2010-09-20 LAB — BASIC METABOLIC PANEL
CO2: 28 mEq/L (ref 19–32)
Chloride: 105 mEq/L (ref 96–112)
Sodium: 142 mEq/L (ref 135–145)

## 2010-09-20 LAB — CBC WITH DIFFERENTIAL/PLATELET
Basophils Relative: 0.8 % (ref 0.0–3.0)
Eosinophils Absolute: 0.2 10*3/uL (ref 0.0–0.7)
Hemoglobin: 11.7 g/dL — ABNORMAL LOW (ref 12.0–15.0)
MCHC: 34.1 g/dL (ref 30.0–36.0)
MCV: 94.1 fl (ref 78.0–100.0)
Monocytes Absolute: 0.5 10*3/uL (ref 0.1–1.0)
Neutro Abs: 3.8 10*3/uL (ref 1.4–7.7)
RBC: 3.64 Mil/uL — ABNORMAL LOW (ref 3.87–5.11)

## 2010-09-20 LAB — TSH: TSH: 1.07 u[IU]/mL (ref 0.35–5.50)

## 2010-09-29 ENCOUNTER — Other Ambulatory Visit: Payer: Self-pay

## 2010-10-06 ENCOUNTER — Encounter: Payer: Self-pay | Admitting: Family Medicine

## 2010-10-06 ENCOUNTER — Ambulatory Visit (INDEPENDENT_AMBULATORY_CARE_PROVIDER_SITE_OTHER): Payer: 59 | Admitting: Family Medicine

## 2010-10-06 DIAGNOSIS — D510 Vitamin B12 deficiency anemia due to intrinsic factor deficiency: Secondary | ICD-10-CM

## 2010-10-06 DIAGNOSIS — K219 Gastro-esophageal reflux disease without esophagitis: Secondary | ICD-10-CM

## 2010-10-06 DIAGNOSIS — N952 Postmenopausal atrophic vaginitis: Secondary | ICD-10-CM

## 2010-10-06 DIAGNOSIS — I739 Peripheral vascular disease, unspecified: Secondary | ICD-10-CM

## 2010-10-06 DIAGNOSIS — E785 Hyperlipidemia, unspecified: Secondary | ICD-10-CM

## 2010-10-06 DIAGNOSIS — I10 Essential (primary) hypertension: Secondary | ICD-10-CM

## 2010-10-06 DIAGNOSIS — M129 Arthropathy, unspecified: Secondary | ICD-10-CM

## 2010-10-06 DIAGNOSIS — J309 Allergic rhinitis, unspecified: Secondary | ICD-10-CM

## 2010-10-06 MED ORDER — MOMETASONE FUROATE 50 MCG/ACT NA SUSP: 2.0000 | Freq: Every day | NASAL | Status: DC

## 2010-10-06 MED ORDER — ESTROGENS, CONJUGATED 0.625 MG/GM VA CREA: 1.0000 g | TOPICAL_CREAM | Freq: Every day | VAGINAL | Status: DC

## 2010-10-06 MED ORDER — RABEPRAZOLE SODIUM 20 MG PO TBEC: 20.0000 mg | DELAYED_RELEASE_TABLET | Freq: Every day | ORAL | Status: DC

## 2010-10-06 MED ORDER — SIMVASTATIN 40 MG PO TABS: 40.0000 mg | ORAL_TABLET | Freq: Every day | ORAL | Status: DC

## 2010-10-06 MED ORDER — ATENOLOL 50 MG PO TABS: 50.0000 mg | ORAL_TABLET | Freq: Every day | ORAL | Status: DC

## 2010-10-06 MED ORDER — TRIAMTERENE-HCTZ 37.5-25 MG PO TABS: 1.0000 | ORAL_TABLET | Freq: Every day | ORAL | Status: DC

## 2010-10-06 NOTE — Progress Notes (Signed)
  Subjective:    Patient ID: Kelsey Sparks, female    DOB: Sep 11, 1945, 65 y.o.   MRN: [id number]  HPI Kelsey Sparks is a 65 year old, married female,,,,,,,,, recently ex-smoker,,,,,,, who comes in today for evaluation of multiple medical problems.  She takes Tenormin 50 mg daily for hypertension, along with Maxzide 25.  BP 120/84.  For postmenopausal vaginal dryness uses Premarin vaginal cream daily.  She uses steroid nasal spray for allergic rhinitis.  She takes Aciphex daily for reflux.  She takes Zocor 40 mg nightly for hyperlipidemia.  A new complaint of soreness in her left hip.  No previous history of trauma.  She's been told in the past.  She has osteoarthritis.  She and now her husband.  No longer smoke.  She gets routine eye care, dental care, BSE monthly, and you mammography, colonoscopy, September 2011 showed 14 polyps, Dr. Aurther Loft wants her to come back.  This fall for follow-up colonoscopy.  I encouraged her to do that.  Tetanus 2007 information given on shingles   Review of Systems  Constitutional: Negative.   HENT: Negative.   Eyes: Negative.   Respiratory: Negative.   Cardiovascular: Negative.   Gastrointestinal: Negative.   Genitourinary: Negative.   Musculoskeletal: Positive for gait problem.  Neurological: Negative.   Hematological: Negative.   Psychiatric/Behavioral: Negative.        Objective:   Physical Exam  Constitutional: She appears well-developed and well-nourished.  HENT:  Head: Normocephalic and atraumatic.  Right Ear: External ear normal.  Left Ear: External ear normal.  Nose: Nose normal.  Mouth/Throat: Oropharynx is clear and moist.  Eyes: EOM are normal. Pupils are equal, round, and reactive to light.  Neck: Normal range of motion. Neck supple. No thyromegaly present.  Cardiovascular: Normal rate, regular rhythm, normal heart sounds and intact distal pulses.  Exam reveals no gallop and no friction rub.   No murmur heard. Pulmonary/Chest:  Effort normal and breath sounds normal.  Abdominal: Soft. Bowel sounds are normal. She exhibits no distension and no mass. There is no tenderness. There is no rebound.  Genitourinary: Vagina normal. Guaiac negative stool. No vaginal discharge found.       Bilateral breast exam normal  Musculoskeletal: Normal range of motion.  Lymphadenopathy:    She has no cervical adenopathy.  Neurological: She is alert. She has normal reflexes. No cranial nerve deficit. She exhibits normal muscle tone. Coordination normal.  Skin: Skin is warm and dry.  Psychiatric: She has a normal mood and affect. Her behavior is normal. Judgment and thought content normal.          Assessment & Plan:  Healthy female.  Hypertension.  Continue Tenormin, 50 mg daily and Maxzide 25.  Postmenopausal vaginal atrophy is primary vaginal cream weekly.  Allergic rhinitis.  Continue steroid nasal spray p.r.n.  Reflux esophagitis.  Aciphex 20 mg daily.  Hyperlipidemia.  Continue Zocor 40 nightly  Return one year, sooner if any problems.  Osteoarthritis.  Motrin, 600 mg with breakfast and began an exercise program

## 2010-10-06 NOTE — Patient Instructions (Signed)
Continue current medications.  Take 600 mg of Motrin for breakfast.  15 minutes of stretching exercises daily in the morning.  Return in one year or sooner if any problems

## 2010-10-18 ENCOUNTER — Other Ambulatory Visit: Payer: Self-pay | Admitting: Internal Medicine

## 2010-10-27 ENCOUNTER — Telehealth: Payer: Self-pay | Admitting: *Deleted

## 2010-10-27 ENCOUNTER — Emergency Department (HOSPITAL_COMMUNITY)
Admission: EM | Admit: 2010-10-27 | Discharge: 2010-10-27 | Disposition: A | Discharge status: Home / Self Care | Payer: 59 | Attending: Emergency Medicine | Admitting: Emergency Medicine

## 2010-10-27 ENCOUNTER — Emergency Department (HOSPITAL_COMMUNITY): Payer: 59

## 2010-10-27 DIAGNOSIS — I10 Essential (primary) hypertension: Secondary | ICD-10-CM | POA: Insufficient documentation

## 2010-10-27 DIAGNOSIS — R51 Headache: Secondary | ICD-10-CM | POA: Insufficient documentation

## 2010-10-27 DIAGNOSIS — E7800 Pure hypercholesterolemia, unspecified: Secondary | ICD-10-CM | POA: Insufficient documentation

## 2010-10-27 DIAGNOSIS — L989 Disorder of the skin and subcutaneous tissue, unspecified: Secondary | ICD-10-CM | POA: Insufficient documentation

## 2010-10-27 LAB — SEDIMENTATION RATE: Sed Rate: 44 mm/hr — ABNORMAL HIGH (ref 0–22)

## 2010-10-27 NOTE — Telephone Encounter (Signed)
Family member leaves message stating pt went to ER last night and had labs.  They are questioning temporal arteritis and want labs repeated in 2 days .  Request to speak to [facility].

## 2010-10-27 NOTE — Telephone Encounter (Signed)
Spoke with husband and appointment made

## 2010-10-28 ENCOUNTER — Encounter: Payer: Self-pay | Admitting: Family Medicine

## 2010-10-28 ENCOUNTER — Ambulatory Visit (INDEPENDENT_AMBULATORY_CARE_PROVIDER_SITE_OTHER): Payer: 59 | Admitting: Family Medicine

## 2010-10-28 VITALS — BP 110/70 | Temp 98.6°F | Wt 150.0 lb

## 2010-10-28 DIAGNOSIS — R51 Headache: Secondary | ICD-10-CM

## 2010-10-28 DIAGNOSIS — I10 Essential (primary) hypertension: Secondary | ICD-10-CM

## 2010-10-28 MED ORDER — GABAPENTIN 100 MG PO CAPS: 100.0000 mg | ORAL_CAPSULE | Freq: Three times a day (TID) | ORAL | Status: DC

## 2010-10-28 NOTE — Patient Instructions (Signed)
Follow up immediately for any worsening headache or new symptoms such as vomiting or confusion.

## 2010-10-28 NOTE — Progress Notes (Signed)
  Subjective:    Patient ID: Kelsey Sparks, female    DOB: Aug 09, 1945, 65 y.o.   MRN: [id number]  HPI Patient seen for right-sided headaches. Started 2 days ago. Went to emergency room. CT of head unremarkable. She describes a fleeting sharp pain localized right temporal region. Moderate severity.  Sedimentation rate 44. No visual disturbance. No tenderness to palpation. No rashes. She denies any head injury, nausea, vomiting, appetite or weight changes. No hearing changes. Pain is sharp and lasts about 1-2 seconds.  She describes remote history of migraine headaches but this is different. She has not taken any medications for this. No stiff neck. No fever chills.  Hypertension stable.  Compliant with meds.   Review of Systems  Constitutional: Negative for fever, chills, appetite change and unexpected weight change.  HENT: Negative for hearing loss, ear pain and neck stiffness.   Eyes: Negative for visual disturbance.  Respiratory: Negative for shortness of breath.   Cardiovascular: Negative for chest pain.  Neurological: Positive for headaches. Negative for dizziness, seizures, syncope, weakness and numbness.  Psychiatric/Behavioral: Negative for confusion.       Objective:   Physical Exam  Constitutional: She is oriented to person, place, and time. She appears well-developed and well-nourished.  HENT:  Head: Normocephalic and atraumatic.  Right Ear: External ear normal.  Left Ear: External ear normal.  Mouth/Throat: Oropharynx is clear and moist.       No temporal artery tenderness.  Eyes: Pupils are equal, round, and reactive to light.  Neck: Neck supple. No thyromegaly present.  Cardiovascular: Normal rate and regular rhythm.   Pulmonary/Chest: Effort normal and breath sounds normal. No respiratory distress. She has no wheezes. She has no rales.  Lymphadenopathy:    She has no cervical adenopathy.  Neurological: She is alert and oriented to person, place, and time. No  cranial nerve deficit.       Strength full throughout. Gait normal. Cerebellar normal finger to nose  Psychiatric: She has a normal mood and affect. Her behavior is normal.          Assessment & Plan:  Atypical headache. Symptoms are fleeting and question neuropathic. Recent CT scan normal. Minimally elevated sedimentation rate but clinically no suspicion for temporal arteritis. Trial of low-dose gabapentin 100 mg twice a day after 2-3 days of no improvement increase to 3 times a day. Followup with primary in one week Hypertension-stable.

## 2010-10-29 ENCOUNTER — Other Ambulatory Visit: Payer: Self-pay | Admitting: Internal Medicine

## 2010-11-03 ENCOUNTER — Encounter: Payer: Self-pay | Admitting: Family Medicine

## 2010-11-03 ENCOUNTER — Ambulatory Visit (INDEPENDENT_AMBULATORY_CARE_PROVIDER_SITE_OTHER): Payer: 59 | Admitting: Family Medicine

## 2010-11-03 VITALS — BP 120/84 | Temp 98.8°F | Wt 151.0 lb

## 2010-11-03 DIAGNOSIS — G500 Trigeminal neuralgia: Secondary | ICD-10-CM

## 2010-11-03 MED ORDER — GABAPENTIN 100 MG PO CAPS: 100.0000 mg | ORAL_CAPSULE | Freq: Three times a day (TID) | ORAL | Status: DC

## 2010-11-03 NOTE — Patient Instructions (Signed)
Continued the Neurontin 100 mg 3 times a day for one month, then decrease to 100 mg twice daily for one month, then decrease to 100 mg daily for one month.  If any juncture, he began to have pain then go back up to the previous level.  We may be able to taper, you often run or you may have to take the Neurontin forever.  Follow-up in 3 months, sooner if any problems

## 2010-11-03 NOTE — Progress Notes (Signed)
  Subjective:    Patient ID: Kelsey Sparks, female    DOB: [birth year], 65 y.o.   MRN: [id number]  HPIMarion is a 65 year old recently ex-smoker, who comes in today for evaluation of pain in her head.  She states last Wednesday.  She had the sudden onset of a sharp pain in the right side of her head.  She points to an area just above her ear.  It was so severe she went to the emergency room.  She had numerous diagnostic studies all of which were negative.  She also has a history of degenerative disease and is seeing a neurosurgeon however, this type of pain is different.  She states initially it was a sharp pain in even cool temperatures or light touch what exacerbated.  Her symptoms are more consistent with trigeminal neuralgia.  She came in and and saw .  Dr. Bb and put her on Neurontin 100 mg t.i.d. Now.  Her pain is gone.  She says this also helped her arthritis pain is decreased or neck pain.    Review of Systems Neurologic and ENT review of systems otherwise negative    Objective:   Physical Exam  Well-developed well-nourished, female, in no acute distress.  Examination of the HEENT were negative.  Neurologic exam normal      Assessment & Plan:  Trigeminal neuralgia.  Plan Taper Neurontin slowly.  Return p.r.n.

## 2010-12-01 ENCOUNTER — Other Ambulatory Visit: Payer: Self-pay | Admitting: Family Medicine

## 2010-12-05 ENCOUNTER — Encounter: Payer: Self-pay | Admitting: Internal Medicine

## 2010-12-20 ENCOUNTER — Ambulatory Visit (AMBULATORY_SURGERY_CENTER): Payer: 59 | Admitting: *Deleted

## 2010-12-20 VITALS — Ht 63.5 in | Wt 148.0 lb

## 2010-12-20 DIAGNOSIS — Z1211 Encounter for screening for malignant neoplasm of colon: Secondary | ICD-10-CM

## 2010-12-20 MED ORDER — PEG-KCL-NACL-NASULF-NA ASC-C 100 G PO SOLR: ORAL | Status: DC

## 2010-12-21 ENCOUNTER — Encounter: Payer: Self-pay | Admitting: Internal Medicine

## 2011-01-03 ENCOUNTER — Encounter: Payer: Self-pay | Admitting: Internal Medicine

## 2011-01-03 ENCOUNTER — Ambulatory Visit (AMBULATORY_SURGERY_CENTER): Payer: 59 | Admitting: Internal Medicine

## 2011-01-03 VITALS — BP 124/73 | HR 81 | Temp 97.1°F | Resp 12 | Ht 63.0 in | Wt 148.0 lb

## 2011-01-03 DIAGNOSIS — D126 Benign neoplasm of colon, unspecified: Secondary | ICD-10-CM

## 2011-01-03 DIAGNOSIS — Z86010 Personal history of colonic polyps: Secondary | ICD-10-CM

## 2011-01-03 DIAGNOSIS — Z1211 Encounter for screening for malignant neoplasm of colon: Secondary | ICD-10-CM

## 2011-01-03 MED ORDER — SODIUM CHLORIDE 0.9 % IV SOLN: 500.0000 mL | INTRAVENOUS | Status: DC

## 2011-01-03 NOTE — Patient Instructions (Signed)
Discharge instructions given with verbal understanding. Handout on polyps given. Resume previous medications. 

## 2011-01-04 ENCOUNTER — Telehealth: Payer: Self-pay

## 2011-01-04 NOTE — Telephone Encounter (Signed)

## 2011-01-11 ENCOUNTER — Other Ambulatory Visit: Payer: Self-pay | Admitting: Internal Medicine

## 2011-01-12 ENCOUNTER — Telehealth: Payer: Self-pay | Admitting: Family Medicine

## 2011-01-12 NOTE — Telephone Encounter (Signed)
Pt req to get a script to get pneumonia vax at pharmacy or to get inj here. Pt is not sure is she needs to get one or not.

## 2011-01-12 NOTE — Telephone Encounter (Signed)
Spoke with patient's husband

## 2011-01-13 ENCOUNTER — Telehealth: Payer: Self-pay | Admitting: Internal Medicine

## 2011-01-13 NOTE — Telephone Encounter (Signed)
I was asked by Ursula Beath [facility] to get pt samples because the nurse was busy, the pt left before she was given the samples.

## 2011-01-25 ENCOUNTER — Other Ambulatory Visit: Payer: Self-pay | Admitting: Internal Medicine

## 2011-01-25 MED ORDER — RABEPRAZOLE SODIUM 20 MG PO TBEC: 20.0000 mg | DELAYED_RELEASE_TABLET | Freq: Every day | ORAL | Status: DC

## 2011-01-25 NOTE — Telephone Encounter (Signed)
Refilled rx with instructions to schedule office visit

## 2011-02-06 ENCOUNTER — Encounter: Payer: Self-pay | Admitting: Family Medicine

## 2011-02-06 ENCOUNTER — Ambulatory Visit (INDEPENDENT_AMBULATORY_CARE_PROVIDER_SITE_OTHER): Payer: 59 | Admitting: Family Medicine

## 2011-02-06 DIAGNOSIS — I10 Essential (primary) hypertension: Secondary | ICD-10-CM

## 2011-02-06 DIAGNOSIS — G500 Trigeminal neuralgia: Secondary | ICD-10-CM

## 2011-02-06 MED ORDER — GABAPENTIN 100 MG PO CAPS: 100.0000 mg | ORAL_CAPSULE | Freq: Two times a day (BID) | ORAL | Status: DC

## 2011-02-06 MED ORDER — MOMETASONE FUROATE 50 MCG/ACT NA SUSP: 2.0000 | Freq: Two times a day (BID) | NASAL | Status: DC

## 2011-02-06 NOTE — Progress Notes (Signed)
  Subjective:    Patient ID: Kelsey Sparks, female    DOB: [birth year], 65 y.o.   MRN: [id number]  HPI Kelsey Sparks is a 65 year old female, recently ex smoker, who comes in today for evaluation of two problems.  She is on Neurontin 100 mg b.i.d. Because of trigeminal neuralgia.  The pain is controlled with this medication.  She is on Tenormin 50 mg daily, Maxzide 37.5 daily for hypertension.  BP 102/68.  Since she stopped smoking.  Her blood pressure has gone down.   Review of Systems    General neurologic, cardiovascular, review of systems otherwise negative Objective:   Physical Exam  Well-developed well-nourished, female, in no acute distress.  BP 102/68 right arm sitting position     Assessment & Plan:  Trigeminal neuralgia, under good control with Neurontin 100 mg b.i.d. Continue above does  Hypertension.  BP too low.  Decrease Tenormin to 25 mg daily monitor blood pressure at home for one month.  If blood pressure continues to remain low and stop the Tenormin completely.

## 2011-02-06 NOTE — Patient Instructions (Signed)
Continue your current medications.  Decrease the Tenormin to one half tablet daily.  Check your blood pressure daily for the next 4 weeks.  Goal for your blood pressure is 135/85.  If after one month your blood pressure continues to remain low, then stop  the Tenormin completely.  If you stop the Tenormin completely then checked her blood pressure daily for 4 weeks after stopping medication to be sure it remains normal

## 2011-02-24 ENCOUNTER — Other Ambulatory Visit: Payer: Self-pay | Admitting: Family Medicine

## 2011-03-01 ENCOUNTER — Other Ambulatory Visit: Payer: Self-pay | Admitting: Family Medicine

## 2011-03-08 ENCOUNTER — Other Ambulatory Visit: Payer: Self-pay | Admitting: Internal Medicine

## 2011-03-09 ENCOUNTER — Other Ambulatory Visit: Payer: Self-pay | Admitting: Family Medicine

## 2011-03-09 ENCOUNTER — Other Ambulatory Visit: Payer: Self-pay

## 2011-03-09 MED ORDER — RABEPRAZOLE SODIUM 20 MG PO TBEC: 20.0000 mg | DELAYED_RELEASE_TABLET | Freq: Every day | ORAL | Status: DC

## 2011-04-06 ENCOUNTER — Telehealth: Payer: Self-pay | Admitting: Family Medicine

## 2011-04-06 NOTE — Telephone Encounter (Signed)
Pt is complaining of swelling under both eyes.  Pt would like an appt for Monday.

## 2011-04-06 NOTE — Telephone Encounter (Signed)
Wants a call back about her eyes. They are puffy with no pain. Wants advice. Pt refused to come in.

## 2011-04-07 ENCOUNTER — Encounter: Payer: Self-pay | Admitting: Family Medicine

## 2011-04-07 ENCOUNTER — Ambulatory Visit (INDEPENDENT_AMBULATORY_CARE_PROVIDER_SITE_OTHER): Payer: 59 | Admitting: Family Medicine

## 2011-04-07 VITALS — BP 110/78 | HR 100 | Temp 99.0°F | Wt 148.0 lb

## 2011-04-07 DIAGNOSIS — T783XXA Angioneurotic edema, initial encounter: Secondary | ICD-10-CM

## 2011-04-07 NOTE — Progress Notes (Signed)
  Subjective:    Patient ID: Kelsey Sparks, female    DOB: 08/26/1945, 66 y.o.   MRN: [id number]  HPI Here for 4 days of burning and itching and swelling in both lower eyelids and upper cheeks. No lip or tongue swelling. No SOB. No recent changes in meds or makeup. No animal exposures.    Review of Systems  Constitutional: Negative.   HENT: Negative.   Eyes: Negative.   Respiratory: Negative.        Objective:   Physical Exam  Constitutional: She appears well-developed and well-nourished.  HENT:  Head: Normocephalic and atraumatic.  Right Ear: External ear normal.  Left Ear: External ear normal.  Nose: Nose normal.  Mouth/Throat: Oropharynx is clear and moist. No oropharyngeal exudate.  Eyes: Conjunctivae and EOM are normal. Pupils are equal, round, and reactive to light.       Mild swelling below both eyes  Neck: Normal range of motion. Neck supple. No thyromegaly present.  Pulmonary/Chest: Effort normal and breath sounds normal.  Lymphadenopathy:    She has no cervical adenopathy.          Assessment & Plan:  Apparent allergic reaction to something, possibly angioedema. She is not on an ACE inhibitor. Try taking Zyrtec bid for a week. Recheck prn

## 2011-04-09 ENCOUNTER — Other Ambulatory Visit: Payer: Self-pay | Admitting: Internal Medicine

## 2011-04-10 ENCOUNTER — Ambulatory Visit: Payer: 59 | Admitting: Family Medicine

## 2011-05-29 ENCOUNTER — Telehealth: Payer: Self-pay

## 2011-05-29 ENCOUNTER — Telehealth: Payer: Self-pay | Admitting: Internal Medicine

## 2011-05-29 NOTE — Telephone Encounter (Signed)
CMA OPENED ANOTHER PHONE NOTE. 

## 2011-05-29 NOTE — Telephone Encounter (Signed)
left message with patient regarding samples of aciphex

## 2011-05-31 ENCOUNTER — Other Ambulatory Visit: Payer: Self-pay | Admitting: Family Medicine

## 2011-06-20 ENCOUNTER — Other Ambulatory Visit: Payer: Self-pay | Admitting: Family Medicine

## 2011-06-21 ENCOUNTER — Encounter: Payer: Self-pay | Admitting: Internal Medicine

## 2011-06-21 ENCOUNTER — Ambulatory Visit (INDEPENDENT_AMBULATORY_CARE_PROVIDER_SITE_OTHER): Payer: 59 | Admitting: Internal Medicine

## 2011-06-21 VITALS — BP 110/70 | HR 92 | Ht 64.0 in | Wt 148.2 lb

## 2011-06-21 DIAGNOSIS — K589 Irritable bowel syndrome without diarrhea: Secondary | ICD-10-CM

## 2011-06-21 DIAGNOSIS — R109 Unspecified abdominal pain: Secondary | ICD-10-CM

## 2011-06-21 DIAGNOSIS — K219 Gastro-esophageal reflux disease without esophagitis: Secondary | ICD-10-CM

## 2011-06-21 DIAGNOSIS — K222 Esophageal obstruction: Secondary | ICD-10-CM

## 2011-06-21 DIAGNOSIS — R1031 Right lower quadrant pain: Secondary | ICD-10-CM

## 2011-06-21 DIAGNOSIS — Z860100 Personal history of colon polyps, unspecified: Secondary | ICD-10-CM

## 2011-06-21 MED ORDER — RABEPRAZOLE SODIUM 20 MG PO TBEC: 20.0000 mg | DELAYED_RELEASE_TABLET | Freq: Every day | ORAL | Status: DC

## 2011-06-21 MED ORDER — HYOSCYAMINE SULFATE 0.125 MG SL SUBL: 0.1250 mg | SUBLINGUAL_TABLET | Freq: Four times a day (QID) | SUBLINGUAL | Status: DC | PRN

## 2011-06-21 NOTE — Patient Instructions (Signed)
We have sent the following medications to your pharmacy for you to pick up at your convenience:  Levsin, Achiphex

## 2011-06-21 NOTE — Progress Notes (Signed)
HISTORY OF PRESENT ILLNESS:  Kelsey Sparks is a 66 y.o. female with hypertension, hyperlipidemia, chronic low back pain, GERD complicated by peptic stricture requiring esophageal dilation, irritable bowel syndrome, and adenomatous colon polyps. Patient presents today regarding abdominal discomfort and alternating bowel habits. As well, followup of her GERD. She has had long-standing IBS, constipation predominant. She describes severe constipation about once per month that required enema therapy. At that time significant abdominal cramping. She does have abdominal cramping about every other day which is improved post defecation. No bleeding. Some diarrhea, although less problematic. For her abdominal cramping she does take sublingual Levsin which helps. She requested a medication refill. Her weight has been stable. Her last colonoscopy, for polyp surveillance, was performed October 2012. 4 polyps removed. Followup in 3 years recommended. Next, for her GERD, she takes AcipHex 20 mg daily. On medication, good control of reflux symptoms. Off medication, significant breakthrough. Her last upper endoscopy was performed in September 2011. At that time, 15 [place] dilation was carried out due to dysphagia from a peptic stricture. She denies recurrent dysphagia. She requests medication refill.  REVIEW OF SYSTEMS:  All non-GI ROS negative except for sinus an allergy trouble, arthritis, back pain, visual changes, cough, headaches, itching, muscle cramps, night sweats, shortness of breath, skin rash, insomnia, sore throat, ankle swelling, urinary leakage, voice change  Past Medical History  Diagnosis Date  . Allergy   . Hyperlipidemia   . Hypertension   . Low back pain   . Peripheral vascular disease   . Muscular degeneration   . Anemia   . Arthritis   . GERD (gastroesophageal reflux disease)   . Adenomatous colon polyp     Past Surgical History  Procedure Date  . Total abdominal hysterectomy  w/ bilateral salpingoophorectomy   . Lumbar disc surgery   . Knee surgery   . Cholecystectomy   . Back surgery   . Neck surgery   . Colonoscopy   . Upper gastrointestinal endoscopy     Social History Kelsey Sparks  reports that she has quit smoking. She has never used smokeless tobacco. She reports that she does not drink alcohol or use illicit drugs.  family history includes Arthritis in her father and mother; Asthma in her mother; Breast cancer in her maternal aunt; and Hypertension in her father.  There is no history of Colon cancer.  No Known Allergies     PHYSICAL EXAMINATION: Vital signs: BP 110/70  Pulse 92  Ht 5\' 4"  (1.626 m)  Wt 148 lb 3.2 oz (67.223 kg)  BMI 25.44 kg/m2 General: Well-developed, well-nourished, no acute distress HEENT: Sclerae are anicteric, conjunctiva pink. Oral mucosa intact Lungs: Clear Heart: Regular Abdomen: soft, nontender, nondistended, no obvious ascites, no peritoneal signs, normal bowel sounds. No organomegaly. Extremities: No edema Psychiatric: alert and oriented x3. Cooperative    ASSESSMENT:  #1. Irritable bowel syndrome. This to explain chronic intermittent problems with cramping, urgency, and alternating bowel habits. #2. History of multiple adenomatous colon polyps. Last colonoscopy as described #3. GERD complicated by peptic stricture. Currently asymptomatic on PPI post dilation. #4. Multiple general medical problems   PLAN:  #1. Increase dietary fiber #2. Refill Levsin sublingual #3. Continue reflux precautions #4. Refill AcipHex #5. Surveillance colonoscopy around October 2015 #6. Repeat endoscopy with esophageal dilation as needed for recurrent dysphagia #6. Ongoing general medical care with Dr. Tawanna Cooler

## 2011-07-08 ENCOUNTER — Other Ambulatory Visit: Payer: Self-pay | Admitting: Family Medicine

## 2011-07-25 ENCOUNTER — Other Ambulatory Visit: Payer: Self-pay | Admitting: *Deleted

## 2011-07-25 DIAGNOSIS — D510 Vitamin B12 deficiency anemia due to intrinsic factor deficiency: Secondary | ICD-10-CM

## 2011-07-25 DIAGNOSIS — I739 Peripheral vascular disease, unspecified: Secondary | ICD-10-CM

## 2011-07-25 DIAGNOSIS — E785 Hyperlipidemia, unspecified: Secondary | ICD-10-CM

## 2011-07-25 DIAGNOSIS — J309 Allergic rhinitis, unspecified: Secondary | ICD-10-CM

## 2011-07-25 DIAGNOSIS — K219 Gastro-esophageal reflux disease without esophagitis: Secondary | ICD-10-CM

## 2011-07-25 DIAGNOSIS — N952 Postmenopausal atrophic vaginitis: Secondary | ICD-10-CM

## 2011-07-25 DIAGNOSIS — I10 Essential (primary) hypertension: Secondary | ICD-10-CM

## 2011-07-25 DIAGNOSIS — M129 Arthropathy, unspecified: Secondary | ICD-10-CM

## 2011-07-25 MED ORDER — ESTROGENS, CONJUGATED 0.625 MG/GM VA CREA: 1.0000 g | TOPICAL_CREAM | Freq: Every day | VAGINAL | Status: DC

## 2011-07-25 MED ORDER — BISACODYL 10 MG RE SUPP: 10.0000 mg | RECTAL | Status: DC | PRN

## 2011-09-19 ENCOUNTER — Other Ambulatory Visit: Payer: Self-pay | Admitting: Family Medicine

## 2011-11-08 ENCOUNTER — Other Ambulatory Visit: Payer: Self-pay | Admitting: Anesthesiology

## 2011-11-08 DIAGNOSIS — M545 Low back pain, unspecified: Secondary | ICD-10-CM

## 2011-11-09 ENCOUNTER — Other Ambulatory Visit: Payer: 59

## 2011-11-16 ENCOUNTER — Ambulatory Visit
Admission: RE | Admit: 2011-11-16 | Discharge: 2011-11-16 | Disposition: A | Discharge status: Home / Self Care | Payer: 59 | Source: Ambulatory Visit | Attending: Anesthesiology | Admitting: Anesthesiology

## 2011-11-16 ENCOUNTER — Encounter: Payer: 59 | Admitting: Family Medicine

## 2011-11-16 DIAGNOSIS — M545 Low back pain, unspecified: Secondary | ICD-10-CM

## 2011-11-23 ENCOUNTER — Encounter: Payer: 59 | Admitting: Family Medicine

## 2011-11-27 ENCOUNTER — Other Ambulatory Visit: Payer: Self-pay | Admitting: Family Medicine

## 2011-12-02 ENCOUNTER — Other Ambulatory Visit: Payer: Self-pay | Admitting: Family Medicine

## 2011-12-06 ENCOUNTER — Telehealth: Payer: Self-pay | Admitting: Family Medicine

## 2011-12-06 NOTE — Telephone Encounter (Signed)
Left message on machine for patient to return our call.  Is patient still taking flexeril three times a day?

## 2011-12-06 NOTE — Telephone Encounter (Signed)
Patient called stating that her pharmacy [facility] [address] has been trying to get a refill of her flexeril since the 7th and has been unsuccessful. Please assist.

## 2011-12-11 ENCOUNTER — Telehealth: Payer: Self-pay | Admitting: Family Medicine

## 2011-12-11 NOTE — Telephone Encounter (Signed)
Denied medication until directions from patient have been confirmed

## 2011-12-11 NOTE — Telephone Encounter (Signed)
Pt requesting Flexeril refill. Her pharmacy states they've been faxing it to [place] since 9/7. [facility]. Thanks.

## 2011-12-12 ENCOUNTER — Other Ambulatory Visit: Payer: Self-pay | Admitting: Family Medicine

## 2011-12-12 MED ORDER — CYCLOBENZAPRINE HCL 5 MG PO TABS: ORAL_TABLET | ORAL | Status: DC

## 2011-12-12 NOTE — Telephone Encounter (Signed)
Patient left message that she is taking 1/2- 1 tab tid flexeril is this okay to fill?

## 2011-12-12 NOTE — Telephone Encounter (Signed)
Flexeril 5 mg,,,,,,,,, recommended dose one half tablet twice daily

## 2011-12-12 NOTE — Telephone Encounter (Signed)
Patient is aware and rx sent ?

## 2012-02-06 ENCOUNTER — Telehealth: Payer: Self-pay | Admitting: *Deleted

## 2012-02-06 MED ORDER — FLUTICASONE PROPIONATE 50 MCG/ACT NA SUSP: 2.0000 | Freq: Every day | NASAL | Status: DC

## 2012-02-06 NOTE — Telephone Encounter (Signed)
Patient would like to switch from nasonex to flonase

## 2012-02-09 ENCOUNTER — Other Ambulatory Visit: Payer: Self-pay | Admitting: Family Medicine

## 2012-02-11 ENCOUNTER — Other Ambulatory Visit: Payer: Self-pay | Admitting: Family Medicine

## 2012-06-21 ENCOUNTER — Other Ambulatory Visit: Payer: Self-pay | Admitting: Family Medicine

## 2012-06-26 ENCOUNTER — Other Ambulatory Visit (INDEPENDENT_AMBULATORY_CARE_PROVIDER_SITE_OTHER): Payer: 59

## 2012-06-26 DIAGNOSIS — Z0000 Encounter for general adult medical examination without abnormal findings: Secondary | ICD-10-CM

## 2012-06-26 LAB — BASIC METABOLIC PANEL
BUN: 22 mg/dL (ref 6–23)
Calcium: 9.8 mg/dL (ref 8.4–10.5)
Creatinine, Ser: 1 mg/dL (ref 0.4–1.2)
GFR: 70.01 mL/min (ref >60.00)
Glucose, Bld: 99 mg/dL (ref 70–99)

## 2012-06-26 LAB — HEPATIC FUNCTION PANEL
AST: 17 U/L (ref 0–37)
Alkaline Phosphatase: 51 U/L (ref 39–117)
Bilirubin, Direct: 0.1 mg/dL (ref 0.0–0.3)
Total Bilirubin: 0.6 mg/dL (ref 0.3–1.2)

## 2012-06-26 LAB — CBC WITH DIFFERENTIAL/PLATELET
Basophils Absolute: 0 10*3/uL (ref 0.0–0.1)
Lymphocytes Relative: 24.9 % (ref 12.0–46.0)
Lymphs Abs: 1.8 10*3/uL (ref 0.7–4.0)
Monocytes Relative: 6.6 % (ref 3.0–12.0)
Neutrophils Relative %: 67.1 % (ref 43.0–77.0)
Platelets: 177 10*3/uL (ref 150.0–400.0)
RDW: 13.9 % (ref 11.5–14.6)
WBC: 7.2 10*3/uL (ref 4.5–10.5)

## 2012-06-26 LAB — LIPID PANEL
HDL: 47.8 mg/dL (ref >39.00)
Total CHOL/HDL Ratio: 3
Triglycerides: 111 mg/dL (ref 0.0–149.0)

## 2012-06-26 LAB — POCT URINALYSIS DIPSTICK
Bilirubin, UA: NEGATIVE
Blood, UA: NEGATIVE
Glucose, UA: NEGATIVE
Ketones, UA: NEGATIVE
Nitrite, UA: NEGATIVE

## 2012-06-26 LAB — TSH: TSH: 1.13 u[IU]/mL (ref 0.35–5.50)

## 2012-06-27 ENCOUNTER — Other Ambulatory Visit: Payer: 59

## 2012-07-04 ENCOUNTER — Ambulatory Visit (INDEPENDENT_AMBULATORY_CARE_PROVIDER_SITE_OTHER): Payer: 59 | Admitting: Family Medicine

## 2012-07-04 ENCOUNTER — Encounter: Payer: Self-pay | Admitting: Family Medicine

## 2012-07-04 VITALS — BP 110/70 | Temp 98.7°F | Ht 64.25 in | Wt 131.0 lb

## 2012-07-04 DIAGNOSIS — I10 Essential (primary) hypertension: Secondary | ICD-10-CM

## 2012-07-04 DIAGNOSIS — E785 Hyperlipidemia, unspecified: Secondary | ICD-10-CM

## 2012-07-04 MED ORDER — ZOLMITRIPTAN 5 MG PO TBDP: 5.0000 mg | ORAL_TABLET | ORAL | Status: DC | PRN

## 2012-07-04 MED ORDER — HYOSCYAMINE SULFATE 0.125 MG SL SUBL: 0.1250 mg | SUBLINGUAL_TABLET | Freq: Four times a day (QID) | SUBLINGUAL | Status: DC | PRN

## 2012-07-04 MED ORDER — CYCLOBENZAPRINE HCL 10 MG PO TABS: 10.0000 mg | ORAL_TABLET | Freq: Every day | ORAL | Status: DC

## 2012-07-04 MED ORDER — SIMVASTATIN 40 MG PO TABS: ORAL_TABLET | ORAL | Status: DC

## 2012-07-04 NOTE — Progress Notes (Signed)
  Subjective:    Patient ID: Kelsey Sparks, female    DOB: 1946/02/26, 67 y.o.   MRN: [id number]  HPI Kelsey Sparks is a 67 year old married female X. Smoker,,, she quit 4 years ago,,,,,, who comes in today for general physical examination because of a history of hypertension, postmenopausal vaginal dryness, chronic neck and back pain, allergic rhinitis, reflux esophagitis, hyperlipidemia and migraine headaches  Her medications reviewed in detail there've been no changes. Some of her medications now are being written by Dr. Ollen Bowl at the pain clinic. She recently had a steroid injection and feels much better.  She takes Tenormin and Maxide daily for hypertension BP 110/70  She takes Zocor 40 mg daily for hyperlipidemia  She takes 20 mg of AcipHex daily for reflux  She takes Levsin when necessary for reflux and IBS  Her Vicodin and Neurontin are written by her pain clinic doctor  She was the Premarin vaginal cream once weekly for postmenopausal vaginal dryness  She gets routine eye care, dental care, BSE monthly, annual manner gram and colonoscopy 10 years  Vaccinations up-to-date  She would like to switch from 5 mg of Flexeril to 10 mg at bedtime   Review of Systems  Constitutional: Negative.   HENT: Negative.   Eyes: Negative.   Respiratory: Negative.   Cardiovascular: Negative.   Gastrointestinal: Negative.   Genitourinary: Negative.   Musculoskeletal: Negative.   Neurological: Negative.   Psychiatric/Behavioral: Negative.        Objective:   Physical Exam  Constitutional: She appears well-developed and well-nourished.  HENT:  Head: Normocephalic and atraumatic.  Right Ear: External ear normal.  Left Ear: External ear normal.  Nose: Nose normal.  Mouth/Throat: Oropharynx is clear and moist.  Eyes: EOM are normal. Pupils are equal, round, and reactive to light.  Neck: Normal range of motion. Neck supple. No thyromegaly present.  Cardiovascular: Normal rate, regular  rhythm, normal heart sounds and intact distal pulses.  Exam reveals no gallop and no friction rub.   No murmur heard. Pulmonary/Chest: Effort normal and breath sounds normal.  Abdominal: Soft. Bowel sounds are normal. She exhibits no distension and no mass. There is no tenderness. There is no rebound.  Genitourinary: Vagina normal. Guaiac negative stool. No vaginal discharge found.  Bilateral breast exam normal BSE was taught  Musculoskeletal: Normal range of motion.  Lymphadenopathy:    She has no cervical adenopathy.  Neurological: She is alert. She has normal reflexes. No cranial nerve deficit. She exhibits normal muscle tone. Coordination normal.  Skin: Skin is warm and dry.  Scar on her left neck and back from previous neck and disc surgery lumbar  Psychiatric: She has a normal mood and affect. Her behavior is normal. Judgment and thought content normal.          Assessment & Plan:  Healthy female  Hypertension continue Tenormin and Maxide  post TAH and BSO vaginal dryness continue Premarin vaginal cream once weekly  Chronic back pain and neck pain continue followup at the pain clinic  Allergic rhinitis continue Flonase nasal spray when necessary  IBS Levsin when necessary  Reflux esophagitis continue AcipHex 20 mg daily  Hyperlipidemia continue Zocor 40 mg daily  Migraine headaches Zomig when necessary

## 2012-07-04 NOTE — Patient Instructions (Signed)
Continue your current medications  Remember to get outside and walk daily  Return in one year sooner if any problems

## 2012-07-30 ENCOUNTER — Telehealth: Payer: Self-pay | Admitting: Internal Medicine

## 2012-07-30 NOTE — Telephone Encounter (Signed)
Patient needs a refill on her Aciphex.  She now uses [facility] on [address]

## 2012-07-31 MED ORDER — RABEPRAZOLE SODIUM 20 MG PO TBEC: 20.0000 mg | DELAYED_RELEASE_TABLET | Freq: Every day | ORAL | Status: DC

## 2012-07-31 NOTE — Telephone Encounter (Signed)
Refilled Aciphex 

## 2012-08-05 ENCOUNTER — Ambulatory Visit (INDEPENDENT_AMBULATORY_CARE_PROVIDER_SITE_OTHER): Payer: 59 | Admitting: Family Medicine

## 2012-08-05 ENCOUNTER — Encounter: Payer: Self-pay | Admitting: Family Medicine

## 2012-08-05 VITALS — BP 110/70 | Temp 99.2°F | Wt 140.0 lb

## 2012-08-05 DIAGNOSIS — R922 Inconclusive mammogram: Secondary | ICD-10-CM

## 2012-08-05 NOTE — Progress Notes (Signed)
  Subjective:    Patient ID: Kelsey Sparks, female    DOB: December 06, 1945, 67 y.o.   MRN: [id number]  HPI Kelsey Sparks is a 67 year old female who comes in today for evaluation following a mammogram  She never had any abnormality on her physical examination her mammograms in the past. She got a letter stating that she has dense breasts and she's not sure what that means   Review of Systems    review of systems negative Objective:   Physical Exam  Well-developed well-nourished female no acute distress breast exam was completely normal BSE was again demonstrated      Assessment & Plan:  Normal breast exam,,,,,,,, dense breasts on mammogram consider 3-D mammogram with next study

## 2012-08-05 NOTE — Patient Instructions (Signed)
Do a thorough breast exam monthly Consider a 3-D mammogram for your next study

## 2012-08-08 ENCOUNTER — Other Ambulatory Visit: Payer: Self-pay | Admitting: Family Medicine

## 2012-08-12 ENCOUNTER — Encounter: Payer: Self-pay | Admitting: Family Medicine

## 2012-08-23 ENCOUNTER — Encounter: Payer: Self-pay | Admitting: Internal Medicine

## 2012-08-23 ENCOUNTER — Ambulatory Visit (INDEPENDENT_AMBULATORY_CARE_PROVIDER_SITE_OTHER): Payer: 59 | Admitting: Internal Medicine

## 2012-08-23 VITALS — BP 100/66 | HR 68 | Ht 64.25 in | Wt 133.0 lb

## 2012-08-23 DIAGNOSIS — Z86010 Personal history of colonic polyps: Secondary | ICD-10-CM

## 2012-08-23 DIAGNOSIS — K222 Esophageal obstruction: Secondary | ICD-10-CM

## 2012-08-23 DIAGNOSIS — K589 Irritable bowel syndrome without diarrhea: Secondary | ICD-10-CM

## 2012-08-23 DIAGNOSIS — K219 Gastro-esophageal reflux disease without esophagitis: Secondary | ICD-10-CM

## 2012-08-23 MED ORDER — RABEPRAZOLE SODIUM 20 MG PO TBEC: 20.0000 mg | DELAYED_RELEASE_TABLET | Freq: Every day | ORAL | Status: DC

## 2012-08-23 NOTE — Patient Instructions (Addendum)
We have sent the following medications to your pharmacy for you to pick up at your convenience:  Aciphex

## 2012-08-23 NOTE — Progress Notes (Signed)
HISTORY OF PRESENT ILLNESS:  Kelsey Sparks is a 67 y.o. female with hyperlipidemia, hypertension, chronic low back pain, GERD complicated by peptic stricture requiring esophageal dilation, chronic irritable bowel syndrome, and adenomatous colon polyps. She presents today for routine GI followup. She was last evaluated March 2013 (see that dictation for details). In terms of irritable bowel, she continues with intermittent cramping, urgency, and alternating bowel habits. She has found Levsin sublingual to be helpful. In terms of GERD, she is taking AcipHex 20 mg daily. On medication good control of reflux symptoms. She last underwent upper endoscopy with esophageal dilation in September of 2011. Since that time, no recurrent dysphagia. Her last colonoscopy was October 2012. Multiple small tubular adenomas were removed. Followup in 3 years recommended based on guidelines. No new GI complaints today. He has had weight loss since her last visit, but states this is intentional  REVIEW OF SYSTEMS:  All non-GI ROS negative except for arthritis, back pain, night sweats  Past Medical History  Diagnosis Date  . Allergy   . Hyperlipidemia   . Hypertension   . Low back pain   . Peripheral vascular disease   . Muscular degeneration   . Anemia   . Arthritis   . GERD (gastroesophageal reflux disease)   . Adenomatous colon polyp   . Esophageal stricture   . IBS (irritable bowel syndrome)   . Hiatal hernia     Past Surgical History  Procedure Laterality Date  . Total abdominal hysterectomy w/ bilateral salpingoophorectomy    . Lumbar disc surgery    . Knee surgery    . Cholecystectomy    . Back surgery    . Neck surgery    . Colonoscopy    . Upper gastrointestinal endoscopy      Social History [facility]  reports that she has quit smoking. She has never used smokeless tobacco. She reports that she does not drink alcohol or use illicit drugs.  family history includes Arthritis in her  father and mother; Asthma in her mother; Breast cancer in her maternal aunt; and Hypertension in her father.  There is no history of Colon cancer.  No Known Allergies     PHYSICAL EXAMINATION: Vital signs: BP 100/66  Pulse 68  Ht 5' 4.25" (1.632 m)  Wt 133 lb (60.328 kg)  BMI 22.65 kg/m2 General: Well-developed, well-nourished, no acute distress HEENT: Sclerae are anicteric, conjunctiva pink. Oral mucosa intact Lungs: Clear Heart: Regular Abdomen: soft, nontender, nondistended, no obvious ascites, no peritoneal signs, normal bowel sounds. No organomegaly. Extremities: No edema Psychiatric: alert and oriented x3. Cooperative   ASSESSMENT:  #1. GERD complicated by peptic stricture. Currently asymptomatic post dilation on PPI #2. Chronic IBS as manifested by urgency, cramping, and alternating bowel habits. Ongoing. Improved with Levsin sublingual #3. History of adenomatous colon polyps. Multiple. Last colonoscopy October 2012  PLAN:  #1. Continue reflux precautions #2. Refill AcipHex as requested #3. Continue Levsin as needed #4. Surveillance colonoscopy around October 2015. #5. Interval GI followup as needed

## 2012-09-03 ENCOUNTER — Other Ambulatory Visit: Payer: Self-pay | Admitting: Family Medicine

## 2012-10-02 ENCOUNTER — Other Ambulatory Visit: Payer: Self-pay | Admitting: *Deleted

## 2012-10-02 MED ORDER — ZOLMITRIPTAN 5 MG PO TABS: 5.0000 mg | ORAL_TABLET | ORAL | Status: DC | PRN

## 2012-10-10 ENCOUNTER — Encounter: Payer: Self-pay | Admitting: Family Medicine

## 2012-10-10 ENCOUNTER — Ambulatory Visit (INDEPENDENT_AMBULATORY_CARE_PROVIDER_SITE_OTHER): Payer: 59 | Admitting: Family Medicine

## 2012-10-10 VITALS — BP 120/80 | Temp 97.9°F | Wt 134.0 lb

## 2012-10-10 DIAGNOSIS — N9089 Other specified noninflammatory disorders of vulva and perineum: Secondary | ICD-10-CM | POA: Diagnosis not present

## 2012-10-10 NOTE — Patient Instructions (Signed)
The lesion is a skin tag it is benign

## 2012-10-10 NOTE — Progress Notes (Signed)
  Subjective:    Patient ID: Kelsey Sparks, female    DOB: April 20, 1945, 67 y.o.   MRN: [id number]  HPI  Kelsey Sparks is a 67 year old married female who comes in today for evaluation of a lump on her labia  She says she noticed a lump on her labia. It's not painful  Review of Systems Review of systems negative    Objective:   Physical Exam  Well-developed well-nourished female no acute distress examination of the vulva shows a skin tag on the external labia      Assessment & Plan:  Skin tag observe

## 2012-10-15 ENCOUNTER — Telehealth: Payer: Self-pay

## 2012-10-15 MED ORDER — RABEPRAZOLE SODIUM 20 MG PO TBEC: 20.0000 mg | DELAYED_RELEASE_TABLET | Freq: Every day | ORAL | Status: DC

## 2012-10-15 NOTE — Telephone Encounter (Signed)
Sent new rx for Aciphex to [facility]

## 2012-11-05 DIAGNOSIS — IMO0002 Reserved for concepts with insufficient information to code with codable children: Secondary | ICD-10-CM | POA: Diagnosis not present

## 2012-11-05 DIAGNOSIS — M545 Low back pain, unspecified: Secondary | ICD-10-CM | POA: Diagnosis not present

## 2012-11-05 DIAGNOSIS — Q762 Congenital spondylolisthesis: Secondary | ICD-10-CM | POA: Diagnosis not present

## 2012-11-21 ENCOUNTER — Other Ambulatory Visit: Payer: Self-pay | Admitting: Family Medicine

## 2012-11-25 ENCOUNTER — Other Ambulatory Visit: Payer: Self-pay | Admitting: Family Medicine

## 2013-01-13 ENCOUNTER — Telehealth: Payer: Self-pay | Admitting: Family Medicine

## 2013-01-13 MED ORDER — FLUTICASONE PROPIONATE 50 MCG/ACT NA SUSP: NASAL | Status: DC

## 2013-01-13 MED ORDER — SIMVASTATIN 40 MG PO TABS: ORAL_TABLET | ORAL | Status: DC

## 2013-01-13 NOTE — Telephone Encounter (Signed)
Pt called and stated that you asked her to call you in regards to her medication. Please assist.

## 2013-02-04 DIAGNOSIS — M545 Low back pain, unspecified: Secondary | ICD-10-CM | POA: Diagnosis not present

## 2013-02-04 DIAGNOSIS — IMO0002 Reserved for concepts with insufficient information to code with codable children: Secondary | ICD-10-CM | POA: Diagnosis not present

## 2013-02-04 DIAGNOSIS — Q762 Congenital spondylolisthesis: Secondary | ICD-10-CM | POA: Diagnosis not present

## 2013-02-24 ENCOUNTER — Other Ambulatory Visit: Payer: Self-pay | Admitting: Family Medicine

## 2013-04-10 ENCOUNTER — Other Ambulatory Visit: Payer: Self-pay | Admitting: Family Medicine

## 2013-04-15 ENCOUNTER — Telehealth: Payer: Self-pay | Admitting: Family Medicine

## 2013-04-15 NOTE — Telephone Encounter (Signed)
FYI

## 2013-04-15 NOTE — Telephone Encounter (Signed)
Patient Information:  Caller Name: Tenesia  Phone: [phone]  Patient: Kelsey Sparks, Kelsey Sparks  Gender: Female  DOB: Dec 24, 1945  Age: 68 Years  PCP: Stevie Kern [facility])  Office Follow Up:  Does the office need to follow up with this patient?: No  Instructions For The Office: N/A  RN Note:  Scheduled appointment for 04/16/2013 at 13:00 with Dr. Sarajane Jews.  Symptoms  Reason For Call & Symptoms: Onset of redi itchy rash on round spot on right shoulder and just below her breast.  Finished PCN for dental procedure.  Pt purchased new bras and wore one 01/12/2014 and 04/15/2013.  Reviewed Health History In FR: Yes  Reviewed Medications In FR: Yes  Reviewed Allergies In FR: Yes  Reviewed Surgeries / Procedures: Yes  Date of Onset of Symptoms: 04/15/2013  Guideline(S) Used:  Rash or Redness - Localized  Disposition Per Guideline:   See Today in Office  Reason For Disposition Reached:   Patient wants to be seen  Advice Given:  Avoid the Cause:   Try to find the cause. Consider irritants like a plant (e.G., poison ivy or evergreens), chemicals (e.G., solvents or insecticides), fiberglass, a new cosmetic, or new jewelry (called contact dermatitis). A pet may be carrying the irritating substance (e.G., with poison ivy or poison oak).  Avoid Soap:  Wash the area once thoroughly with soap to remove any remaining irritants. Thereafter avoid soaps to this area. Cleanse the area when needed with warm water.  Apply Cold to the Area:  Apply or soak in cold water for 20 minutes every 3 to 4 hours to reduce itching or pain.  Hydrocortisone Cream for Itching:   Keep the cream in the refrigerator (Reason: it feels better if applied cold).  Available over-the-counter in [place] as 0.5% and 1% cream.  Available over-the-counter in [place] as 0.5% cream.  CAUTION: Do not use hydrocortisone cream on suspected athlete's foot, jock itch, ringworm, or impetigo.  Avoid Scratching:  Try not  to scratch. Cut your fingernails short.  Contagiousness:  Adults with localized rashes do not need to miss any work or school.  Expected Course:  Most of these rashes pass in 2 to 3 days.  Patient Will Follow Care Advice:  YES  Appointment Scheduled:  04/16/2013 13:00:00 Appointment Scheduled Provider:  Alysia Penna [facility])

## 2013-04-16 ENCOUNTER — Ambulatory Visit: Payer: Self-pay | Admitting: Family Medicine

## 2013-04-28 DIAGNOSIS — Q762 Congenital spondylolisthesis: Secondary | ICD-10-CM | POA: Diagnosis not present

## 2013-04-28 DIAGNOSIS — IMO0002 Reserved for concepts with insufficient information to code with codable children: Secondary | ICD-10-CM | POA: Diagnosis not present

## 2013-04-28 DIAGNOSIS — M545 Low back pain, unspecified: Secondary | ICD-10-CM | POA: Diagnosis not present

## 2013-05-12 DIAGNOSIS — M545 Low back pain, unspecified: Secondary | ICD-10-CM | POA: Diagnosis not present

## 2013-05-12 DIAGNOSIS — Q762 Congenital spondylolisthesis: Secondary | ICD-10-CM | POA: Diagnosis not present

## 2013-05-12 DIAGNOSIS — IMO0002 Reserved for concepts with insufficient information to code with codable children: Secondary | ICD-10-CM | POA: Diagnosis not present

## 2013-06-06 ENCOUNTER — Other Ambulatory Visit: Payer: Self-pay | Admitting: Internal Medicine

## 2013-06-10 DIAGNOSIS — M545 Low back pain, unspecified: Secondary | ICD-10-CM | POA: Diagnosis not present

## 2013-06-10 DIAGNOSIS — IMO0002 Reserved for concepts with insufficient information to code with codable children: Secondary | ICD-10-CM | POA: Diagnosis not present

## 2013-06-10 DIAGNOSIS — Q762 Congenital spondylolisthesis: Secondary | ICD-10-CM | POA: Diagnosis not present

## 2013-07-11 DIAGNOSIS — H18419 Arcus senilis, unspecified eye: Secondary | ICD-10-CM | POA: Diagnosis not present

## 2013-07-11 DIAGNOSIS — H3530 Unspecified macular degeneration: Secondary | ICD-10-CM | POA: Diagnosis not present

## 2013-07-11 DIAGNOSIS — H2510 Age-related nuclear cataract, unspecified eye: Secondary | ICD-10-CM | POA: Diagnosis not present

## 2013-07-13 ENCOUNTER — Other Ambulatory Visit: Payer: Self-pay | Admitting: Family Medicine

## 2013-07-18 ENCOUNTER — Other Ambulatory Visit: Payer: Self-pay | Admitting: Family Medicine

## 2013-07-28 DIAGNOSIS — Z803 Family history of malignant neoplasm of breast: Secondary | ICD-10-CM | POA: Diagnosis not present

## 2013-07-28 DIAGNOSIS — Z1231 Encounter for screening mammogram for malignant neoplasm of breast: Secondary | ICD-10-CM | POA: Diagnosis not present

## 2013-07-29 DIAGNOSIS — Q762 Congenital spondylolisthesis: Secondary | ICD-10-CM | POA: Diagnosis not present

## 2013-07-29 DIAGNOSIS — IMO0002 Reserved for concepts with insufficient information to code with codable children: Secondary | ICD-10-CM | POA: Diagnosis not present

## 2013-07-29 DIAGNOSIS — M545 Low back pain, unspecified: Secondary | ICD-10-CM | POA: Diagnosis not present

## 2013-07-30 ENCOUNTER — Telehealth: Payer: Self-pay | Admitting: Family Medicine

## 2013-07-30 NOTE — Telephone Encounter (Signed)
Patient Information:  Caller Name: Kelsey Sparks  Phone: [phone]  Patient: Kelsey Sparks, Kelsey Sparks  Gender: Female  DOB: 18-Mar-1946  Age: 68 Years  PCP: Stevie Kern [facility])  Office Follow Up:  Does the office need to follow up with this patient?: Yes  Instructions For The Office: Contact patient to give further instructions.  RN Note:  Patient would like to know if she should change her dosage of blood pressure medication. Care advice and call back parameters given per guideline. Please contact patient to give further instructions if she needs to change dosage of medication.  Symptoms  Reason For Call & Symptoms: Reports blood pressure has been lower than normal. BP this afternoon 110/71. Patient has not taken her blood pressure medication today and would like to know if she needs to stop taking it due to the drop in blood pressure today. She reports she does not usually check blood pressure on a daily basis, but reports it usually is higher in the office.  Reviewed Health History In EMR: Yes  Reviewed Medications In EMR: Yes  Reviewed Allergies In EMR: Yes  Reviewed Surgeries / Procedures: Yes  Date of Onset of Symptoms: 07/29/2013  Guideline(s) Used:  High Blood Pressure  Disposition Per Guideline:   Home Care  Reason For Disposition Reached:   BP < 120/80  Advice Given:  N/A  Patient Will Follow Care Advice:  YES

## 2013-08-01 NOTE — Telephone Encounter (Signed)
Spoke with patient.  Her blood pressure today is 117/79 with pulse of 91.  Patient will continue to monitor her blood pressure over the weekend.  If her blood pressure lowers she will come in on Monday.  If it is normal we will see her at her next appointment.

## 2013-08-04 ENCOUNTER — Telehealth: Payer: Self-pay | Admitting: Family Medicine

## 2013-08-04 NOTE — Telephone Encounter (Signed)
Left detailed message returning her call to come by the office for a bp check

## 2013-08-04 NOTE — Telephone Encounter (Signed)
Patient was told that she could come in at any time to get her BP machine calibrated by Apolonio Schneiders. I advised patient that Apolonio Schneiders wasn't here this morning, but I would send a note back and have Apolonio Schneiders to call her as soon as she gets in. Her contact number has been verified. Thanks!

## 2013-08-11 DIAGNOSIS — M545 Low back pain, unspecified: Secondary | ICD-10-CM | POA: Diagnosis not present

## 2013-08-11 DIAGNOSIS — IMO0002 Reserved for concepts with insufficient information to code with codable children: Secondary | ICD-10-CM | POA: Diagnosis not present

## 2013-09-08 DIAGNOSIS — M545 Low back pain, unspecified: Secondary | ICD-10-CM | POA: Diagnosis not present

## 2013-09-08 DIAGNOSIS — Q762 Congenital spondylolisthesis: Secondary | ICD-10-CM | POA: Diagnosis not present

## 2013-09-08 DIAGNOSIS — IMO0002 Reserved for concepts with insufficient information to code with codable children: Secondary | ICD-10-CM | POA: Diagnosis not present

## 2013-09-11 DIAGNOSIS — M545 Low back pain, unspecified: Secondary | ICD-10-CM | POA: Diagnosis not present

## 2013-09-11 DIAGNOSIS — IMO0002 Reserved for concepts with insufficient information to code with codable children: Secondary | ICD-10-CM | POA: Diagnosis not present

## 2013-10-06 ENCOUNTER — Encounter: Payer: Self-pay | Admitting: Family Medicine

## 2013-10-06 ENCOUNTER — Ambulatory Visit (INDEPENDENT_AMBULATORY_CARE_PROVIDER_SITE_OTHER): Payer: 59 | Admitting: Family Medicine

## 2013-10-06 VITALS — BP 110/80 | Temp 98.4°F | Ht 64.0 in | Wt 133.0 lb

## 2013-10-06 DIAGNOSIS — D510 Vitamin B12 deficiency anemia due to intrinsic factor deficiency: Secondary | ICD-10-CM | POA: Diagnosis not present

## 2013-10-06 DIAGNOSIS — K589 Irritable bowel syndrome without diarrhea: Secondary | ICD-10-CM | POA: Diagnosis not present

## 2013-10-06 DIAGNOSIS — K2100 Gastro-esophageal reflux disease with esophagitis, without bleeding: Secondary | ICD-10-CM

## 2013-10-06 DIAGNOSIS — K319 Disease of stomach and duodenum, unspecified: Secondary | ICD-10-CM

## 2013-10-06 DIAGNOSIS — I10 Essential (primary) hypertension: Secondary | ICD-10-CM | POA: Diagnosis not present

## 2013-10-06 DIAGNOSIS — I739 Peripheral vascular disease, unspecified: Secondary | ICD-10-CM

## 2013-10-06 DIAGNOSIS — Z23 Encounter for immunization: Secondary | ICD-10-CM

## 2013-10-06 DIAGNOSIS — G500 Trigeminal neuralgia: Secondary | ICD-10-CM

## 2013-10-06 DIAGNOSIS — J309 Allergic rhinitis, unspecified: Secondary | ICD-10-CM

## 2013-10-06 LAB — HEPATIC FUNCTION PANEL
ALT: 15 U/L (ref 0–35)
AST: 16 U/L (ref 0–37)
Albumin: 4 g/dL (ref 3.5–5.2)
Alkaline Phosphatase: 70 U/L (ref 39–117)
BILIRUBIN TOTAL: 0.6 mg/dL (ref 0.2–1.2)
Bilirubin, Direct: 0.1 mg/dL (ref 0.0–0.3)
Total Protein: 7.8 g/dL (ref 6.0–8.3)

## 2013-10-06 LAB — CBC WITH DIFFERENTIAL/PLATELET
Basophils Absolute: 0.1 10*3/uL (ref 0.0–0.1)
Basophils Relative: 1.8 % (ref 0.0–3.0)
EOS ABS: 0.1 10*3/uL (ref 0.0–0.7)
EOS PCT: 1.5 % (ref 0.0–5.0)
HCT: 37.4 % (ref 36.0–46.0)
Hemoglobin: 12.3 g/dL (ref 12.0–15.0)
LYMPHS PCT: 15.1 % (ref 12.0–46.0)
Lymphs Abs: 1.2 10*3/uL (ref 0.7–4.0)
MCHC: 33 g/dL (ref 30.0–36.0)
MCV: 94.8 fl (ref 78.0–100.0)
Monocytes Absolute: 0.6 10*3/uL (ref 0.1–1.0)
Monocytes Relative: 7.8 % (ref 3.0–12.0)
NEUTROS PCT: 73.8 % (ref 43.0–77.0)
Neutro Abs: 5.6 10*3/uL (ref 1.4–7.7)
PLATELETS: 253 10*3/uL (ref 150.0–400.0)
RBC: 3.95 Mil/uL (ref 3.87–5.11)
RDW: 14.5 % (ref 11.5–15.5)
WBC: 7.6 10*3/uL (ref 4.0–10.5)

## 2013-10-06 LAB — TSH: TSH: 0.27 u[IU]/mL — AB (ref 0.35–4.50)

## 2013-10-06 LAB — LIPID PANEL
Cholesterol: 158 mg/dL (ref 0–200)
HDL: 45.1 mg/dL (ref >39.00)
LDL CALC: 92 mg/dL (ref 0–99)
NonHDL: 112.9
Total CHOL/HDL Ratio: 4
Triglycerides: 104 mg/dL (ref 0.0–149.0)
VLDL: 20.8 mg/dL (ref 0.0–40.0)

## 2013-10-06 LAB — BASIC METABOLIC PANEL
BUN: 20 mg/dL (ref 6–23)
CO2: 29 mEq/L (ref 19–32)
Calcium: 9.6 mg/dL (ref 8.4–10.5)
Chloride: 107 mEq/L (ref 96–112)
Creatinine, Ser: 1.3 mg/dL — ABNORMAL HIGH (ref 0.4–1.2)
GFR: 53.66 mL/min — AB (ref >60.00)
Glucose, Bld: 97 mg/dL (ref 70–99)
POTASSIUM: 3.4 meq/L — AB (ref 3.5–5.1)
SODIUM: 143 meq/L (ref 135–145)

## 2013-10-06 MED ORDER — SIMVASTATIN 40 MG PO TABS: ORAL_TABLET | ORAL | Status: DC

## 2013-10-06 MED ORDER — ATENOLOL 50 MG PO TABS: ORAL_TABLET | ORAL | Status: DC

## 2013-10-06 MED ORDER — ESTROGENS, CONJUGATED 0.625 MG/GM VA CREA: 0.2500 g | TOPICAL_CREAM | Freq: Two times a day (BID) | VAGINAL | Status: DC

## 2013-10-06 MED ORDER — RABEPRAZOLE SODIUM 20 MG PO TBEC: DELAYED_RELEASE_TABLET | ORAL | Status: DC

## 2013-10-06 MED ORDER — TRIAMTERENE-HCTZ 37.5-25 MG PO TABS: ORAL_TABLET | ORAL | Status: DC

## 2013-10-06 MED ORDER — CYCLOBENZAPRINE HCL 10 MG PO TABS: ORAL_TABLET | ORAL | Status: DC

## 2013-10-06 MED ORDER — HYOSCYAMINE SULFATE 0.125 MG PO TABS: ORAL_TABLET | ORAL | Status: DC

## 2013-10-06 NOTE — Progress Notes (Signed)
   Subjective:    Patient ID: Kelsey Sparks, female    DOB: 06-06-1945, 68 y.o.   MRN: [id number]  HPI Kelsey Sparks is a 68 year old married female X. smoker who comes in today for evaluation of hypertension, allergic rhinitis, postmenopausal and postsurgical vaginal dryness...Marland KitchenMarland Kitchen she had her uterus and ovaries removed for nonmalignant reasons.. 6 history of trigeminal neuralgia, chronic back pain followed by Dr. Maryjean Ka, IBS, reflux esophagitis, hyperlipidemia,  She states overall she feels well. She has no specific complaints. 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 He had a brain tumor move subsequently had an infection. They were not able to put a plate until recently. He then had a secondary subdural hematoma which had to be evacuated. He tolerated the procedure well and clinically is doing well however he is walking now with a 4. walker and needs fairly total care by her. Vaccinations updated by Apolonio Schneiders    Review of Systems  Constitutional: Negative.   HENT: Negative.   Eyes: Negative.   Respiratory: Negative.   Cardiovascular: Negative.   Gastrointestinal: Negative.   Genitourinary: Negative.   Musculoskeletal: Negative.   Neurological: Negative.   Psychiatric/Behavioral: Negative.        Objective:   Physical Exam  Nursing note and vitals reviewed. Constitutional: She appears well-developed and well-nourished.  HENT:  Head: Normocephalic and atraumatic.  Right Ear: External ear normal.  Left Ear: External ear normal.  Nose: Nose normal.  Mouth/Throat: Oropharynx is clear and moist.  Eyes: EOM are normal. Pupils are equal, round, and reactive to light.  Neck: Normal range of motion. Neck supple. No thyromegaly present.  Cardiovascular: Normal rate, regular rhythm, normal heart sounds and intact distal pulses.  Exam reveals no gallop and no friction rub.   No murmur  heard. Pulmonary/Chest: Effort normal and breath sounds normal.  Abdominal: Soft. Bowel sounds are normal. She exhibits no distension and no mass. There is no tenderness. There is no rebound.  Genitourinary:  Mild breast exam normal  Musculoskeletal: Normal range of motion.  Lymphadenopathy:    She has no cervical adenopathy.  Neurological: She is alert. She has normal reflexes. No cranial nerve deficit. She exhibits normal muscle tone. Coordination normal.  Skin: Skin is warm and dry.  Total body skin exam normal except for scar lower back from previous to surgery  Psychiatric: She has a normal mood and affect. Her behavior is normal. Judgment and thought content normal.          Assessment & Plan:  Healthy female  Hypertension at goal continue current therapy  Allergic rhinitis continue over-the-counter Zyrtec  Postmenopausal postsurgical vaginal dryness Premarin vaginal cream  Trigeminal neuralgia followed by neurology  Low back pain followed by neuro surgery  Irritable bowel syndrome Levsin when necessary  Hyperlipidemia Zocor daily  Migraine headaches........ they've abated

## 2013-10-06 NOTE — Progress Notes (Signed)
Pre visit review using our clinic review tool, if applicable. No additional management support is needed unless otherwise documented below in the visit note. 

## 2013-10-06 NOTE — Patient Instructions (Signed)
Continue your current medications  Followup in 1 year sooner if any problems 

## 2013-10-07 ENCOUNTER — Telehealth: Payer: Self-pay | Admitting: Family Medicine

## 2013-10-07 NOTE — Telephone Encounter (Signed)
Relevant patient education mailed to patient.  

## 2013-10-09 DIAGNOSIS — Q762 Congenital spondylolisthesis: Secondary | ICD-10-CM | POA: Diagnosis not present

## 2013-10-09 DIAGNOSIS — M545 Low back pain, unspecified: Secondary | ICD-10-CM | POA: Diagnosis not present

## 2013-10-09 DIAGNOSIS — IMO0002 Reserved for concepts with insufficient information to code with codable children: Secondary | ICD-10-CM | POA: Diagnosis not present

## 2013-11-13 ENCOUNTER — Encounter: Payer: Self-pay | Admitting: Internal Medicine

## 2013-11-15 ENCOUNTER — Encounter: Payer: Self-pay | Admitting: Internal Medicine

## 2013-12-02 ENCOUNTER — Other Ambulatory Visit: Payer: Self-pay | Admitting: Internal Medicine

## 2013-12-11 DIAGNOSIS — IMO0002 Reserved for concepts with insufficient information to code with codable children: Secondary | ICD-10-CM | POA: Diagnosis not present

## 2013-12-11 DIAGNOSIS — Q762 Congenital spondylolisthesis: Secondary | ICD-10-CM | POA: Diagnosis not present

## 2013-12-11 DIAGNOSIS — M545 Low back pain, unspecified: Secondary | ICD-10-CM | POA: Diagnosis not present

## 2013-12-11 DIAGNOSIS — M25569 Pain in unspecified knee: Secondary | ICD-10-CM | POA: Diagnosis not present

## 2013-12-29 ENCOUNTER — Encounter: Payer: Self-pay | Admitting: Internal Medicine

## 2013-12-30 DIAGNOSIS — M4316 Spondylolisthesis, lumbar region: Secondary | ICD-10-CM | POA: Diagnosis not present

## 2013-12-30 DIAGNOSIS — M5441 Lumbago with sciatica, right side: Secondary | ICD-10-CM | POA: Diagnosis not present

## 2014-01-21 DIAGNOSIS — M25552 Pain in left hip: Secondary | ICD-10-CM | POA: Diagnosis not present

## 2014-01-21 DIAGNOSIS — M5416 Radiculopathy, lumbar region: Secondary | ICD-10-CM | POA: Diagnosis not present

## 2014-01-21 DIAGNOSIS — M545 Low back pain: Secondary | ICD-10-CM | POA: Diagnosis not present

## 2014-02-03 DIAGNOSIS — M5416 Radiculopathy, lumbar region: Secondary | ICD-10-CM | POA: Diagnosis not present

## 2014-02-11 DIAGNOSIS — M545 Low back pain: Secondary | ICD-10-CM | POA: Diagnosis not present

## 2014-02-11 DIAGNOSIS — M5416 Radiculopathy, lumbar region: Secondary | ICD-10-CM | POA: Diagnosis not present

## 2014-02-12 ENCOUNTER — Ambulatory Visit (AMBULATORY_SURGERY_CENTER): Payer: Self-pay | Admitting: *Deleted

## 2014-02-12 VITALS — Ht 66.0 in | Wt 138.0 lb

## 2014-02-12 DIAGNOSIS — Z86010 Personal history of colonic polyps: Secondary | ICD-10-CM

## 2014-02-12 MED ORDER — MOVIPREP 100 G PO SOLR: 1.0000 | Freq: Once | ORAL | Status: DC

## 2014-02-12 NOTE — Progress Notes (Signed)
No egg or soy allergy. ewm No issues with past sedation. ewm No blood thinners, no diet pills. ewm No home 02 use. ewm

## 2014-02-26 ENCOUNTER — Encounter: Payer: Self-pay | Admitting: Internal Medicine

## 2014-02-26 ENCOUNTER — Ambulatory Visit (AMBULATORY_SURGERY_CENTER): Payer: Medicare Other | Admitting: Internal Medicine

## 2014-02-26 VITALS — BP 132/78 | HR 83 | Temp 97.2°F | Resp 17 | Ht 64.0 in | Wt 133.0 lb

## 2014-02-26 DIAGNOSIS — D123 Benign neoplasm of transverse colon: Secondary | ICD-10-CM

## 2014-02-26 DIAGNOSIS — I739 Peripheral vascular disease, unspecified: Secondary | ICD-10-CM | POA: Diagnosis not present

## 2014-02-26 DIAGNOSIS — D122 Benign neoplasm of ascending colon: Secondary | ICD-10-CM

## 2014-02-26 DIAGNOSIS — Z86010 Personal history of colonic polyps: Secondary | ICD-10-CM

## 2014-02-26 DIAGNOSIS — D124 Benign neoplasm of descending colon: Secondary | ICD-10-CM | POA: Diagnosis not present

## 2014-02-26 DIAGNOSIS — I10 Essential (primary) hypertension: Secondary | ICD-10-CM | POA: Diagnosis not present

## 2014-02-26 MED ORDER — SODIUM CHLORIDE 0.9 % IV SOLN: 500.0000 mL | INTRAVENOUS | Status: DC

## 2014-02-26 NOTE — Op Note (Signed)
[place]  [address] [place], [id number]   COLONOSCOPY PROCEDURE REPORT  PATIENT: Kelsey Sparks, Kelsey Sparks  MR#: [id number] BIRTHDATE: Nov 26, 1945 , 63  yrs. old GENDER: female ENDOSCOPIST: Eustace Quail, MD REFERRED WG:[id number] Program Recall PROCEDURE DATE:  02/26/2014 PROCEDURE:   Colonoscopy with snare polypectomy x 3 First Screening Colonoscopy - Avg.  risk and is 50 yrs.  old or older - No.  Prior Negative Screening - Now for repeat screening. N/A  History of Adenoma - Now for follow-up colonoscopy & has been > or = to 3 yrs.  Yes hx of adenoma.  Has been 3 or more years since last colonoscopy.  Polyps Removed Today? Yes. ASA CLASS:   Class II INDICATIONS:surveillance colonoscopy based on a history of adenomatous colonic polyp(s).. Index exam September 2011 with greater than 10 adenomas. Follow-up October 2012 polyps -4 MEDICATIONS: Monitored anesthesia care and Propofol 230 mg IV  DESCRIPTION OF PROCEDURE:   After the risks benefits and alternatives of the procedure were thoroughly explained, informed consent was obtained.  The digital rectal exam revealed no abnormalities of the rectum.   The LB [id number]  endoscope was introduced through the anus and advanced to the cecum, which was identified by both the appendix and ileocecal valve. No adverse events experienced.   The quality of the prep was excellent, using MoviPrep  The instrument was then slowly withdrawn as the colon was fully examined.  COLON FINDINGS: Three polyps measuring 5 mm in size were found in the descending colon, transverse colon, and ascending colon.  A polypectomy was performed with a cold snare.  The resection was complete, the polyp tissue was completely retrieved and sent to histology.   There was mild diverticulosis noted in the left colon and right colon.   The examination was otherwise normal. Retroflexed views revealed no abnormalities. The time to  cecum=5 minutes 28 seconds.  Withdrawal time=10 minutes 44 seconds.  The scope was withdrawn and the procedure completed. COMPLICATIONS: There were no immediate complications.  ENDOSCOPIC IMPRESSION: 1.   Three polyps were found in the colon; polypectomy was performed with a cold snare 2.   Mild diverticulosis was noted in the left colon and right colon  3.   The examination was otherwise normal  RECOMMENDATIONS: 1. Follow up colonoscopy in 3 years  eSigned:  Eustace Quail, MD 02/26/2014 10:56 AM   cc: The Patient and Dorena Cookey, MD

## 2014-02-26 NOTE — Patient Instructions (Signed)
YOU HAD AN ENDOSCOPIC PROCEDURE TODAY AT THE [facility]: Refer to the procedure report that was given to you for any specific questions about what was found during the examination.  If the procedure report does not answer your questions, please call your gastroenterologist to clarify.  If you requested that your care partner not be given the details of your procedure findings, then the procedure report has been included in a sealed envelope for you to review at your convenience later.  YOU SHOULD EXPECT: Some feelings of bloating in the abdomen. Passage of more gas than usual.  Walking can help get rid of the air that was put into your GI tract during the procedure and reduce the bloating. If you had a lower endoscopy (such as a colonoscopy or flexible sigmoidoscopy) you may notice spotting of blood in your stool or on the toilet paper. If you underwent a bowel prep for your procedure, then you may not have a normal bowel movement for a few days.  DIET: Your first meal following the procedure should be a light meal and then it is ok to progress to your normal diet.  A half-sandwich or bowl of soup is an example of a good first meal.  Heavy or fried foods are harder to digest and may make you feel nauseous or bloated.  Likewise meals heavy in dairy and vegetables can cause extra gas to form and this can also increase the bloating.  Drink plenty of fluids but you should avoid alcoholic beverages for 24 hours. Try to increase the fiber in your diet.  ACTIVITY: Your care partner should take you home directly after the procedure.  You should plan to take it easy, moving slowly for the rest of the day.  You can resume normal activity the day after the procedure however you should NOT DRIVE or use heavy machinery for 24 hours (because of the sedation medicines used during the test).    SYMPTOMS TO REPORT IMMEDIATELY: A gastroenterologist can be reached at any hour.  During normal business hours, 8:30  AM to 5:00 PM Monday through Friday, call [phone].  After hours and on weekends, please call the GI answering service at [phone] who will take a message and have the physician on call contact you.   Following lower endoscopy (colonoscopy or flexible sigmoidoscopy):  Excessive amounts of blood in the stool  Significant tenderness or worsening of abdominal pains  Swelling of the abdomen that is new, acute  Fever of 100F or higher  FOLLOW UP: If any biopsies were taken you will be contacted by phone or by letter within the next 1-3 weeks.  Call your gastroenterologist if you have not heard about the biopsies in 3 weeks.  Our staff will call the home number listed on your records the next business day following your procedure to check on you and address any questions or concerns that you may have at that time regarding the information given to you following your procedure. This is a courtesy call and so if there is no answer at the home number and we have not heard from you through the emergency physician on call, we will assume that you have returned to your regular daily activities without incident.  SIGNATURES/CONFIDENTIALITY: You and/or your care partner have signed paperwork which will be entered into your electronic medical record.  These signatures attest to the fact that that the information above on your After Visit Summary has been reviewed and is understood.    Full responsibility of the confidentiality of this discharge information lies with you and/or your care-partner.  Read all of the handouts given to you by your recovery room nurse. 

## 2014-02-26 NOTE — Progress Notes (Signed)
Called to room to assist during endoscopic procedure.  Patient ID and intended procedure confirmed with present staff. Received instructions for my participation in the procedure from the performing physician.  

## 2014-02-26 NOTE — Progress Notes (Signed)
Report to PACU, RN, vss, BBS= Clear.  

## 2014-02-27 ENCOUNTER — Telehealth: Payer: Self-pay | Admitting: *Deleted

## 2014-02-27 NOTE — Telephone Encounter (Signed)
  Follow up Call-  Call back number 02/26/2014  Post procedure Call Back phone  # [phone]  Permission to leave phone message Yes     Patient questions:  Do you have a fever, pain , or abdominal swelling? No. Pain Score  0 *  Have you tolerated food without any problems? Yes.    Have you been able to return to your normal activities? Yes.    Do you have any questions about your discharge instructions: Diet   No. Medications  No. Follow up visit  No.  Do you have questions or concerns about your Care? No.  Actions: * If pain score is 4 or above: No action needed, pain <4.

## 2014-03-04 ENCOUNTER — Encounter: Payer: Self-pay | Admitting: Internal Medicine

## 2014-03-09 DIAGNOSIS — M5416 Radiculopathy, lumbar region: Secondary | ICD-10-CM | POA: Diagnosis not present

## 2014-03-09 DIAGNOSIS — M4316 Spondylolisthesis, lumbar region: Secondary | ICD-10-CM | POA: Diagnosis not present

## 2014-03-09 DIAGNOSIS — M4806 Spinal stenosis, lumbar region: Secondary | ICD-10-CM | POA: Diagnosis not present

## 2014-03-17 ENCOUNTER — Other Ambulatory Visit: Payer: Self-pay | Admitting: Family Medicine

## 2014-04-02 DIAGNOSIS — M4806 Spinal stenosis, lumbar region: Secondary | ICD-10-CM | POA: Diagnosis not present

## 2014-04-02 DIAGNOSIS — M545 Low back pain: Secondary | ICD-10-CM | POA: Diagnosis not present

## 2014-04-02 DIAGNOSIS — M4316 Spondylolisthesis, lumbar region: Secondary | ICD-10-CM | POA: Diagnosis not present

## 2014-04-29 DIAGNOSIS — M4316 Spondylolisthesis, lumbar region: Secondary | ICD-10-CM | POA: Diagnosis not present

## 2014-04-29 DIAGNOSIS — M4806 Spinal stenosis, lumbar region: Secondary | ICD-10-CM | POA: Diagnosis not present

## 2014-04-29 DIAGNOSIS — M5416 Radiculopathy, lumbar region: Secondary | ICD-10-CM | POA: Diagnosis not present

## 2014-04-29 DIAGNOSIS — M545 Low back pain: Secondary | ICD-10-CM | POA: Diagnosis not present

## 2014-05-15 DIAGNOSIS — M4316 Spondylolisthesis, lumbar region: Secondary | ICD-10-CM | POA: Diagnosis not present

## 2014-05-15 DIAGNOSIS — M5416 Radiculopathy, lumbar region: Secondary | ICD-10-CM | POA: Diagnosis not present

## 2014-05-25 ENCOUNTER — Other Ambulatory Visit: Payer: Self-pay | Admitting: *Deleted

## 2014-05-25 DIAGNOSIS — K219 Gastro-esophageal reflux disease without esophagitis: Secondary | ICD-10-CM

## 2014-05-25 MED ORDER — RABEPRAZOLE SODIUM 20 MG PO TBEC: DELAYED_RELEASE_TABLET | ORAL | Status: DC

## 2014-06-03 DIAGNOSIS — M5441 Lumbago with sciatica, right side: Secondary | ICD-10-CM | POA: Diagnosis not present

## 2014-06-03 DIAGNOSIS — M5416 Radiculopathy, lumbar region: Secondary | ICD-10-CM | POA: Diagnosis not present

## 2014-06-03 DIAGNOSIS — M4316 Spondylolisthesis, lumbar region: Secondary | ICD-10-CM | POA: Diagnosis not present

## 2014-06-03 DIAGNOSIS — M545 Low back pain: Secondary | ICD-10-CM | POA: Diagnosis not present

## 2014-06-03 DIAGNOSIS — M25552 Pain in left hip: Secondary | ICD-10-CM | POA: Diagnosis not present

## 2014-06-03 DIAGNOSIS — M4806 Spinal stenosis, lumbar region: Secondary | ICD-10-CM | POA: Diagnosis not present

## 2014-06-16 DIAGNOSIS — Z79899 Other long term (current) drug therapy: Secondary | ICD-10-CM | POA: Diagnosis not present

## 2014-06-16 DIAGNOSIS — M5441 Lumbago with sciatica, right side: Secondary | ICD-10-CM | POA: Diagnosis not present

## 2014-06-16 DIAGNOSIS — Z049 Encounter for examination and observation for unspecified reason: Secondary | ICD-10-CM | POA: Diagnosis not present

## 2014-06-16 DIAGNOSIS — R51 Headache: Secondary | ICD-10-CM | POA: Diagnosis not present

## 2014-06-16 DIAGNOSIS — M5416 Radiculopathy, lumbar region: Secondary | ICD-10-CM | POA: Diagnosis not present

## 2014-06-16 DIAGNOSIS — G43019 Migraine without aura, intractable, without status migrainosus: Secondary | ICD-10-CM | POA: Diagnosis not present

## 2014-06-16 DIAGNOSIS — M4316 Spondylolisthesis, lumbar region: Secondary | ICD-10-CM | POA: Diagnosis not present

## 2014-06-30 DIAGNOSIS — R51 Headache: Secondary | ICD-10-CM | POA: Diagnosis not present

## 2014-06-30 DIAGNOSIS — M791 Myalgia: Secondary | ICD-10-CM | POA: Diagnosis not present

## 2014-06-30 DIAGNOSIS — G43019 Migraine without aura, intractable, without status migrainosus: Secondary | ICD-10-CM | POA: Diagnosis not present

## 2014-06-30 DIAGNOSIS — G518 Other disorders of facial nerve: Secondary | ICD-10-CM | POA: Diagnosis not present

## 2014-06-30 DIAGNOSIS — M542 Cervicalgia: Secondary | ICD-10-CM | POA: Diagnosis not present

## 2014-07-21 DIAGNOSIS — H02839 Dermatochalasis of unspecified eye, unspecified eyelid: Secondary | ICD-10-CM | POA: Diagnosis not present

## 2014-07-21 DIAGNOSIS — H04121 Dry eye syndrome of right lacrimal gland: Secondary | ICD-10-CM | POA: Diagnosis not present

## 2014-07-21 DIAGNOSIS — H18413 Arcus senilis, bilateral: Secondary | ICD-10-CM | POA: Diagnosis not present

## 2014-07-21 DIAGNOSIS — H3531 Nonexudative age-related macular degeneration: Secondary | ICD-10-CM | POA: Diagnosis not present

## 2014-07-22 DIAGNOSIS — M542 Cervicalgia: Secondary | ICD-10-CM | POA: Diagnosis not present

## 2014-07-22 DIAGNOSIS — G43019 Migraine without aura, intractable, without status migrainosus: Secondary | ICD-10-CM | POA: Diagnosis not present

## 2014-07-22 DIAGNOSIS — R51 Headache: Secondary | ICD-10-CM | POA: Diagnosis not present

## 2014-07-22 DIAGNOSIS — M791 Myalgia: Secondary | ICD-10-CM | POA: Diagnosis not present

## 2014-07-22 DIAGNOSIS — G518 Other disorders of facial nerve: Secondary | ICD-10-CM | POA: Diagnosis not present

## 2014-07-31 DIAGNOSIS — Z1231 Encounter for screening mammogram for malignant neoplasm of breast: Secondary | ICD-10-CM | POA: Diagnosis not present

## 2014-07-31 LAB — HM MAMMOGRAPHY

## 2014-08-03 ENCOUNTER — Encounter: Payer: Self-pay | Admitting: Family Medicine

## 2014-08-03 DIAGNOSIS — R51 Headache: Secondary | ICD-10-CM | POA: Diagnosis not present

## 2014-08-03 DIAGNOSIS — M542 Cervicalgia: Secondary | ICD-10-CM | POA: Diagnosis not present

## 2014-08-03 DIAGNOSIS — G43019 Migraine without aura, intractable, without status migrainosus: Secondary | ICD-10-CM | POA: Diagnosis not present

## 2014-08-03 DIAGNOSIS — G518 Other disorders of facial nerve: Secondary | ICD-10-CM | POA: Diagnosis not present

## 2014-08-03 DIAGNOSIS — M791 Myalgia: Secondary | ICD-10-CM | POA: Diagnosis not present

## 2014-08-10 DIAGNOSIS — M4316 Spondylolisthesis, lumbar region: Secondary | ICD-10-CM | POA: Diagnosis not present

## 2014-08-10 DIAGNOSIS — M5416 Radiculopathy, lumbar region: Secondary | ICD-10-CM | POA: Diagnosis not present

## 2014-08-17 DIAGNOSIS — G43019 Migraine without aura, intractable, without status migrainosus: Secondary | ICD-10-CM | POA: Diagnosis not present

## 2014-08-17 DIAGNOSIS — M791 Myalgia: Secondary | ICD-10-CM | POA: Diagnosis not present

## 2014-08-17 DIAGNOSIS — G518 Other disorders of facial nerve: Secondary | ICD-10-CM | POA: Diagnosis not present

## 2014-08-17 DIAGNOSIS — R51 Headache: Secondary | ICD-10-CM | POA: Diagnosis not present

## 2014-08-17 DIAGNOSIS — M542 Cervicalgia: Secondary | ICD-10-CM | POA: Diagnosis not present

## 2014-09-09 DIAGNOSIS — G43019 Migraine without aura, intractable, without status migrainosus: Secondary | ICD-10-CM | POA: Diagnosis not present

## 2014-09-09 DIAGNOSIS — R51 Headache: Secondary | ICD-10-CM | POA: Diagnosis not present

## 2014-09-09 DIAGNOSIS — M542 Cervicalgia: Secondary | ICD-10-CM | POA: Diagnosis not present

## 2014-09-09 DIAGNOSIS — M791 Myalgia: Secondary | ICD-10-CM | POA: Diagnosis not present

## 2014-09-09 DIAGNOSIS — G518 Other disorders of facial nerve: Secondary | ICD-10-CM | POA: Diagnosis not present

## 2014-09-10 DIAGNOSIS — M4316 Spondylolisthesis, lumbar region: Secondary | ICD-10-CM | POA: Diagnosis not present

## 2014-09-10 DIAGNOSIS — M4806 Spinal stenosis, lumbar region: Secondary | ICD-10-CM | POA: Diagnosis not present

## 2014-09-10 DIAGNOSIS — M25552 Pain in left hip: Secondary | ICD-10-CM | POA: Diagnosis not present

## 2014-09-10 DIAGNOSIS — M5441 Lumbago with sciatica, right side: Secondary | ICD-10-CM | POA: Diagnosis not present

## 2014-09-10 DIAGNOSIS — M5416 Radiculopathy, lumbar region: Secondary | ICD-10-CM | POA: Diagnosis not present

## 2014-09-10 DIAGNOSIS — M545 Low back pain: Secondary | ICD-10-CM | POA: Diagnosis not present

## 2014-09-24 DIAGNOSIS — R51 Headache: Secondary | ICD-10-CM | POA: Diagnosis not present

## 2014-09-24 DIAGNOSIS — M791 Myalgia: Secondary | ICD-10-CM | POA: Diagnosis not present

## 2014-09-24 DIAGNOSIS — G518 Other disorders of facial nerve: Secondary | ICD-10-CM | POA: Diagnosis not present

## 2014-09-24 DIAGNOSIS — G43019 Migraine without aura, intractable, without status migrainosus: Secondary | ICD-10-CM | POA: Diagnosis not present

## 2014-09-24 DIAGNOSIS — M542 Cervicalgia: Secondary | ICD-10-CM | POA: Diagnosis not present

## 2014-10-01 DIAGNOSIS — G629 Polyneuropathy, unspecified: Secondary | ICD-10-CM | POA: Diagnosis not present

## 2014-10-05 DIAGNOSIS — G629 Polyneuropathy, unspecified: Secondary | ICD-10-CM | POA: Diagnosis not present

## 2014-10-19 ENCOUNTER — Other Ambulatory Visit: Payer: Self-pay | Admitting: Family Medicine

## 2014-10-26 ENCOUNTER — Other Ambulatory Visit: Payer: Self-pay | Admitting: Family Medicine

## 2014-10-27 ENCOUNTER — Encounter: Payer: Self-pay | Admitting: Adult Health

## 2014-10-27 ENCOUNTER — Ambulatory Visit (INDEPENDENT_AMBULATORY_CARE_PROVIDER_SITE_OTHER): Payer: Medicare Other | Admitting: Adult Health

## 2014-10-27 VITALS — BP 110/70 | Temp 98.1°F | Ht 64.0 in | Wt 148.0 lb

## 2014-10-27 DIAGNOSIS — E785 Hyperlipidemia, unspecified: Secondary | ICD-10-CM

## 2014-10-27 DIAGNOSIS — Z7689 Persons encountering health services in other specified circumstances: Secondary | ICD-10-CM

## 2014-10-27 DIAGNOSIS — Z780 Asymptomatic menopausal state: Secondary | ICD-10-CM

## 2014-10-27 DIAGNOSIS — Z7189 Other specified counseling: Secondary | ICD-10-CM

## 2014-10-27 DIAGNOSIS — I10 Essential (primary) hypertension: Secondary | ICD-10-CM

## 2014-10-27 MED ORDER — SIMVASTATIN 40 MG PO TABS: 40.0000 mg | ORAL_TABLET | Freq: Every day | ORAL | Status: DC

## 2014-10-27 NOTE — Addendum Note (Signed)
Addended by: Dorothyann Peng L on: 10/27/2014 12:00 PM   Modules accepted: Level of Service

## 2014-10-27 NOTE — Patient Instructions (Signed)
It was great seeing you today!  Please follow up in September for your physical.   For your arthritis pain, please try Osteo-Bi-Flex and also take a Tylenol.   Someone will call you to schedule your bone density screen.

## 2014-10-27 NOTE — Progress Notes (Signed)
Pre visit review using our clinic review tool, if applicable. No additional management support is needed unless otherwise documented below in the visit note. 

## 2014-10-27 NOTE — Progress Notes (Addendum)
HPI:  Kelsey Sparks is here to establish care. She is a pleasant 69 year old with  has a past medical history of Allergy; Hyperlipidemia; Hypertension; Low back pain; Peripheral vascular disease; Muscular degeneration; Anemia; Arthritis; GERD (gastroesophageal reflux disease); Adenomatous colon polyp; Esophageal stricture; IBS (irritable bowel syndrome); and Hiatal hernia.  She states overall she feels well. She has no specific complaints. She gets routine eye care, dental care, BSE monthly, and you mammography, colonoscopy and GI  Last PCP and physical:09/2013 with MD Sherren Mocha Immunizations:UTD Diet:Eats healthy, high fiber diet.  Exercise: Walks Colonoscopy:02/2014 - every 10 yeas Dexa:Needs, will refer.  Mammogram:07/2014  Has the following chronic problems that require follow up and concerns today:  HTN - Is controlled on current medication regimen.   Macular Degeneration  - Followed by specialist for this.   ROS negative for unless reported above: fevers, chills,feeling poorly, unintentional weight loss, hearing or vision loss, chest pain, palpitations, leg claudication, struggling to breath,Not feeling congested in the chest, no orthopenia, no cough,no wheezing, normal appetite, no soft tissue swelling, no hemoptysis, melena, hematochezia, hematuria, falls, loc, si, or thoughts of self harm.    Past Medical History  Diagnosis Date  . Allergy   . Hyperlipidemia   . Hypertension   . Low back pain   . Peripheral vascular disease   . Muscular degeneration   . Anemia   . Arthritis   . GERD (gastroesophageal reflux disease)   . Adenomatous colon polyp   . Esophageal stricture   . IBS (irritable bowel syndrome)   . Hiatal hernia     Past Surgical History  Procedure Laterality Date  . Total abdominal hysterectomy w/ bilateral salpingoophorectomy    . Lumbar disc surgery    . Knee surgery    . Cholecystectomy    . Back surgery    . Neck surgery    . Colonoscopy     . Upper gastrointestinal endoscopy    . Polypectomy      Family History  Problem Relation Age of Onset  . Arthritis Mother   . Asthma Mother   . Arthritis Father   . Hypertension Father   . Breast cancer Maternal Aunt   . Colon cancer Neg Hx     History   Social History  . Marital Status: Married    Spouse Name: N/A  . Number of Children: N/A  . Years of Education: N/A   Social History Main Topics  . Smoking status: Former Research scientist (life sciences)  . Smokeless tobacco: Never Used  . Alcohol Use: No  . Drug Use: No  . Sexual Activity: Not on file   Other Topics Concern  . None   Social History Narrative     Current outpatient prescriptions:  .  aspirin 81 MG tablet, Take 81 mg by mouth daily., Disp: , Rfl:  .  atenolol (TENORMIN) 50 MG tablet, take 1 tablet by mouth every morning, Disp: 100 tablet, Rfl: 3 .  baclofen (LIORESAL) 10 MG tablet, Take 10 mg by mouth 2 (two) times daily., Disp: , Rfl:  .  cetirizine (ZYRTEC) 10 MG tablet, Take 10 mg by mouth daily., Disp: , Rfl:  .  conjugated estrogens (PREMARIN) vaginal cream, Place 0.53 Applicatorfuls vaginally 2 (two) times daily. UAD 11, Disp: 42.5 g, Rfl: 11 .  cyclobenzaprine (FLEXERIL) 10 MG tablet, take 1 tablet by mouth at bedtime if needed, Disp: 90 tablet, Rfl: 1 .  fluticasone (FLONASE) 50 MCG/ACT nasal spray, instill 2 sprays into  each nostril once daily, Disp: 48 g, Rfl: 3 .  gabapentin (NEURONTIN) 100 MG capsule, Take 200 mg by mouth 3 (three) times daily., Disp: , Rfl: 0 .  hyoscyamine (LEVSIN, ANASPAZ) 0.125 MG tablet, PLACE 1 TABLET UNDER THE TONGUE EVERY 6 HOURS AS NEEDED FOR CRAMPING, Disp: 360 tablet, Rfl: 3 .  meclizine (ANTIVERT) 25 MG tablet, take 1/2-1 tablet by mouth every 6 hours if needed, Disp: 40 tablet, Rfl: 0 .  Polyethyl Glycol-Propyl Glycol (SYSTANE) 0.4-0.3 % SOLN, Apply to eye., Disp: , Rfl:  .  RABEprazole (ACIPHEX) 20 MG tablet, take 1 tablet by mouth once daily, Disp: 90 tablet, Rfl: 3 .  simvastatin  (ZOCOR) 40 MG tablet, take 1 tablet by mouth once daily, Disp: 90 tablet, Rfl: 3 .  triamterene-hydrochlorothiazide (MAXZIDE-25) 37.5-25 MG per tablet, take 1 tablet by mouth every morning, Disp: 90 tablet, Rfl: 3 .  VOLTAREN 1 % GEL, , Disp: , Rfl: 0 .  zonisamide (ZONEGRAN) 25 MG capsule, Take 50 mg by mouth daily. Increase as directed, Disp: , Rfl: 0 .  traMADol (ULTRAM) 50 MG tablet, Take 1-2 tablets by mouth every 8 (eight) hours as needed., Disp: , Rfl: 0 .  [DISCONTINUED] ferrous sulfate 325 (65 FE) MG tablet, Take 325 mg by mouth daily with breakfast.  , Disp: , Rfl:   EXAM:  Filed Vitals:   10/27/14 0957  BP: 110/70  Temp: 98.1 F (36.7 C)    Body mass index is 25.39 kg/(m^2).  GENERAL: vitals reviewed and listed above, alert, oriented, appears well hydrated and in no acute distress  HEENT: atraumatic, conjunttiva clear, no obvious abnormalities on inspection of external nose and ears. She is wearing glasses. TM's visualized.   NECK: Neck is soft and supple without masses, no adenopathy or thyromegaly, trachea midline, no JVD. Normal range of motion.   LUNGS: clear to auscultation bilaterally, no wheezes, rales or rhonchi, good air movement  CV: Regular rate and rhythm, normal S1/S2, no audible murmurs, gallops, or rubs. No carotid bruit and no peripheral edema.   MS: moves all extremities without noticeable abnormality. No edema noted  Abd: soft/nontender/nondistended/normal bowel sounds   Skin: warm and dry, no rash   Extremities: No clubbing, cyanosis, or edema. Capillary refill is WNL. Pulses intact bilaterally in upper and lower extremities.   Neuro: CN II-XII intact, sensation and reflexes normal throughout, 5/5 muscle strength in bilateral upper and lower extremities. Normal finger to nose. Normal rapid alternating movements.   PSYCH: pleasant and cooperative, no obvious depression or anxiety  ASSESSMENT AND PLAN:  1. Encounter to establish care - Follow  up in September for CPE - Follow up sooner if needed - Continue to eat a healthy diet and exercise.    2. Essential hypertension - Controlled on current medication. No change - Consider decreasing some of her BP medications.   3. Postmenopausal - DG Bone Density; Future  4. Hyperlipidemia - simvastatin (ZOCOR) 40 MG tablet; Take 1 tablet (40 mg total) by mouth daily.  Dispense: 90 tablet; Refill: 3   -We reviewed the PMH, PSH, FH, SH, Meds and Allergies. -We provided refills for any medications we will prescribe as needed. -We addressed current concerns per orders and patient instructions. -We have asked for records for pertinent exams, studies, vaccines and notes from previous providers. -We have advised patient to follow up per instructions below.   -Patient advised to return or notify a provider immediately if symptoms worsen or persist or new concerns arise.  Dorothyann Peng, AGNP

## 2014-11-17 DIAGNOSIS — G518 Other disorders of facial nerve: Secondary | ICD-10-CM | POA: Diagnosis not present

## 2014-11-17 DIAGNOSIS — M791 Myalgia: Secondary | ICD-10-CM | POA: Diagnosis not present

## 2014-11-17 DIAGNOSIS — G43019 Migraine without aura, intractable, without status migrainosus: Secondary | ICD-10-CM | POA: Diagnosis not present

## 2014-11-17 DIAGNOSIS — M542 Cervicalgia: Secondary | ICD-10-CM | POA: Diagnosis not present

## 2014-11-17 DIAGNOSIS — R51 Headache: Secondary | ICD-10-CM | POA: Diagnosis not present

## 2014-11-20 DIAGNOSIS — Z23 Encounter for immunization: Secondary | ICD-10-CM | POA: Diagnosis not present

## 2014-12-04 ENCOUNTER — Encounter: Payer: Self-pay | Admitting: Adult Health

## 2014-12-04 ENCOUNTER — Telehealth: Payer: Self-pay | Admitting: Adult Health

## 2014-12-04 ENCOUNTER — Ambulatory Visit (INDEPENDENT_AMBULATORY_CARE_PROVIDER_SITE_OTHER): Payer: Medicare Other | Admitting: Adult Health

## 2014-12-04 VITALS — BP 110/74 | Temp 97.5°F | Ht 64.0 in | Wt 144.9 lb

## 2014-12-04 DIAGNOSIS — Z780 Asymptomatic menopausal state: Secondary | ICD-10-CM | POA: Diagnosis not present

## 2014-12-04 DIAGNOSIS — Z0000 Encounter for general adult medical examination without abnormal findings: Secondary | ICD-10-CM

## 2014-12-04 DIAGNOSIS — M19041 Primary osteoarthritis, right hand: Secondary | ICD-10-CM

## 2014-12-04 DIAGNOSIS — M19042 Primary osteoarthritis, left hand: Secondary | ICD-10-CM

## 2014-12-04 DIAGNOSIS — E785 Hyperlipidemia, unspecified: Secondary | ICD-10-CM

## 2014-12-04 DIAGNOSIS — I10 Essential (primary) hypertension: Secondary | ICD-10-CM

## 2014-12-04 LAB — HEPATIC FUNCTION PANEL
ALT: 10 U/L (ref 0–35)
AST: 14 U/L (ref 0–37)
Albumin: 4.4 g/dL (ref 3.5–5.2)
Alkaline Phosphatase: 80 U/L (ref 39–117)
Bilirubin, Direct: 0 mg/dL (ref 0.0–0.3)
Total Bilirubin: 0.5 mg/dL (ref 0.2–1.2)
Total Protein: 7.6 g/dL (ref 6.0–8.3)

## 2014-12-04 LAB — POCT URINALYSIS DIPSTICK
Bilirubin, UA: NEGATIVE
GLUCOSE UA: NEGATIVE
Ketones, UA: NEGATIVE
Leukocytes, UA: NEGATIVE
NITRITE UA: NEGATIVE
PH UA: 7
Protein, UA: NEGATIVE
RBC UA: NEGATIVE
SPEC GRAV UA: 1.015
UROBILINOGEN UA: 0.2

## 2014-12-04 LAB — TSH: TSH: 1.24 u[IU]/mL (ref 0.35–4.50)

## 2014-12-04 LAB — BASIC METABOLIC PANEL
BUN: 21 mg/dL (ref 6–23)
CHLORIDE: 104 meq/L (ref 96–112)
CO2: 30 meq/L (ref 19–32)
Calcium: 9.8 mg/dL (ref 8.4–10.5)
Creatinine, Ser: 1.09 mg/dL (ref 0.40–1.20)
GFR: 64.36 mL/min (ref >60.00)
Glucose, Bld: 107 mg/dL — ABNORMAL HIGH (ref 70–99)
POTASSIUM: 4 meq/L (ref 3.5–5.1)
Sodium: 141 mEq/L (ref 135–145)

## 2014-12-04 LAB — CBC WITH DIFFERENTIAL/PLATELET
BASOS PCT: 0.6 % (ref 0.0–3.0)
Basophils Absolute: 0 10*3/uL (ref 0.0–0.1)
EOS PCT: 3.1 % (ref 0.0–5.0)
Eosinophils Absolute: 0.2 10*3/uL (ref 0.0–0.7)
HCT: 38.5 % (ref 36.0–46.0)
Hemoglobin: 13 g/dL (ref 12.0–15.0)
Lymphocytes Relative: 27.9 % (ref 12.0–46.0)
Lymphs Abs: 1.7 10*3/uL (ref 0.7–4.0)
MCHC: 33.7 g/dL (ref 30.0–36.0)
MCV: 92.1 fl (ref 78.0–100.0)
MONO ABS: 0.6 10*3/uL (ref 0.1–1.0)
Monocytes Relative: 8.9 % (ref 3.0–12.0)
Neutro Abs: 3.7 10*3/uL (ref 1.4–7.7)
Neutrophils Relative %: 59.5 % (ref 43.0–77.0)
Platelets: 216 10*3/uL (ref 150.0–400.0)
RBC: 4.18 Mil/uL (ref 3.87–5.11)
RDW: 14.8 % (ref 11.5–15.5)
WBC: 6.2 10*3/uL (ref 4.0–10.5)

## 2014-12-04 LAB — LIPID PANEL
CHOLESTEROL: 174 mg/dL (ref 0–200)
HDL: 46.2 mg/dL (ref >39.00)
LDL Cholesterol: 97 mg/dL (ref 0–99)
NonHDL: 128.16
Total CHOL/HDL Ratio: 4
Triglycerides: 157 mg/dL — ABNORMAL HIGH (ref 0.0–149.0)
VLDL: 31.4 mg/dL (ref 0.0–40.0)

## 2014-12-04 MED ORDER — MELOXICAM 7.5 MG PO TABS: 7.5000 mg | ORAL_TABLET | Freq: Every day | ORAL | Status: DC

## 2014-12-04 NOTE — Patient Instructions (Signed)
  Kelsey Sparks , Thank you for taking time to come for your Medicare Wellness Visit. I appreciate your ongoing commitment to your health goals. Please review the following plan we discussed and let me know if I can assist you in the future.   These are the goals we discussed: Goals    . Increase physical activity    . Reduce sodium intake       This is a list of the screening recommended for you and due dates:  Health Maintenance  Topic Date Due  .  Hepatitis C: One time screening is recommended by Center for Disease Control  (CDC) for  adults born from [birth year] through 1963.   [birth year]  . DEXA scan (bone density measurement)  10/07/2012  . Pneumonia vaccines (2 of 2 - PPSV23) 10/07/2014  . Flu Shot  10/26/2015  . Mammogram  07/30/2016  . Colon Cancer Screening  02/26/2017  . Tetanus Vaccine  10/29/2024  . Shingles Vaccine  Completed

## 2014-12-04 NOTE — Progress Notes (Addendum)
Subjective:  Patient presents today for their annual wellness visit. She is a very pleasant AA female with  has a past medical history of Allergy; Hyperlipidemia; Hypertension; Low back pain; Peripheral vascular disease; Muscular degeneration; Anemia; Arthritis; GERD (gastroesophageal reflux disease); Adenomatous colon polyp; Esophageal stricture; IBS (irritable bowel syndrome); Hiatal hernia; and Macular degeneration.  She does not have any interval history since last exam.   She follows up on her health maintenance items such as dental visits and eye exams. She does self BRE. She it UTD on colonoscopy and mammograms  Preventive Screening-Counseling & Management  Smoking Status:Former Smoker Second Hand Smoking status: No smokers in home  Risk Factors Regular exercise: Does not do formal exercise Diet: Heart healthy diet Fall Risk: None   Cardiac risk factors:  advanced age (older than 56 for men, 53 for women) Hyperlipidemia and HTN No diabetes. Family History: Father had CHF  Depression Screen None. PHQ2 0   Activities of Daily Living  Independent ADLs and IADLs   Hearing Difficulties: patient declines  Cognitive Testing No reported trouble.   Normal 3 word recall  List the Names of Other Physician/Practitioners you currently use: 1.Dr. Maryjean Ka - Pain Management 2. Dr. Tommy Rainwater - Optho 3. Dr. Vertell Limber - Neurology 4.Dr. Lenore Cordia - Dentist  Immunization History  Administered Date(s) Administered  . Influenza Split 12/25/2011, 11/13/2012, 11/24/2013  . Influenza Whole 01/04/2007, 01/09/2008, 02/03/2009, 12/28/2010  . Influenza, High Dose Seasonal PF 11/20/2014  . Pneumococcal Conjugate-13 10/06/2013  . Pneumococcal Polysaccharide-23 07/13/2003, 08/18/2008  . Td 05/22/2005  . Tdap 10/30/2014  . Zoster 01/26/2011   Required Immunizations needed today: she is UTD  Screening tests- up to date Health Maintenance Due  Topic Date Due  . Hepatitis C  Screening  [birth year]  . DEXA SCAN  10/07/2012  . PNA vac Low Risk Adult (2 of 2 - PPSV23) 10/07/2014    ROS- Complains of pain in her hands and knees from arthritis. She would like referral to pain management for this. She would also like to try something for the pain. Has been taking Osteo-BiFlex.   The following were reviewed and entered/updated in epic: Past Medical History  Diagnosis Date  . Allergy   . Hyperlipidemia   . Hypertension   . Low back pain   . Peripheral vascular disease   . Muscular degeneration   . Anemia   . Arthritis   . GERD (gastroesophageal reflux disease)   . Adenomatous colon polyp   . Esophageal stricture   . IBS (irritable bowel syndrome)   . Hiatal hernia   . Macular degeneration    Patient Active Problem List   Diagnosis Date Noted  . Skin tag of vulva 10/10/2012  . Dense breasts 08/05/2012  . Trigeminal neuralgia 11/03/2010  . VAGINITIS, ATROPHIC 09/09/2009  . ANEMIA, PERNICIOUS 06/27/2007  . PERIPHERAL VASCULAR DISEASE 06/25/2007  . ALLERGIC RHINITIS 06/25/2007  . Hyperlipidemia 06/04/2007  . Essential hypertension 06/04/2007  . Arthropathy 06/04/2007   Past Surgical History  Procedure Laterality Date  . Total abdominal hysterectomy w/ bilateral salpingoophorectomy    . Lumbar disc surgery    . Knee surgery    . Cholecystectomy    . Back surgery    . Neck surgery    . Colonoscopy    . Upper gastrointestinal endoscopy    . Polypectomy      Family History  Problem Relation Age of Onset  . Arthritis Mother   . Asthma Mother   . Arthritis  Father   . Hypertension Father   . Breast cancer Maternal Aunt   . Colon cancer Neg Hx     Medications- reviewed and updated Current Outpatient Prescriptions  Medication Sig Dispense Refill  . aspirin 81 MG tablet Take 81 mg by mouth daily.    Marland Kitchen atenolol (TENORMIN) 50 MG tablet take 1 tablet by mouth every morning 100 tablet 3  . baclofen (LIORESAL) 10 MG tablet Take 10 mg by mouth 2  (two) times daily.    . cetirizine (ZYRTEC) 10 MG tablet Take 10 mg by mouth daily.    Marland Kitchen conjugated estrogens (PREMARIN) vaginal cream Place 4.80 Applicatorfuls vaginally 2 (two) times daily. UAD 11 42.5 g 11  . cyclobenzaprine (FLEXERIL) 10 MG tablet take 1 tablet by mouth at bedtime if needed 90 tablet 1  . fluticasone (FLONASE) 50 MCG/ACT nasal spray instill 2 sprays into each nostril once daily 48 g 3  . gabapentin (NEURONTIN) 100 MG capsule Take 200 mg by mouth 3 (three) times daily.  0  . hyoscyamine (LEVSIN, ANASPAZ) 0.125 MG tablet PLACE 1 TABLET UNDER THE TONGUE EVERY 6 HOURS AS NEEDED FOR CRAMPING 360 tablet 3  . meclizine (ANTIVERT) 25 MG tablet take 1/2-1 tablet by mouth every 6 hours if needed 40 tablet 0  . Polyethyl Glycol-Propyl Glycol (SYSTANE) 0.4-0.3 % SOLN Apply to eye.    . RABEprazole (ACIPHEX) 20 MG tablet take 1 tablet by mouth once daily 90 tablet 3  . simvastatin (ZOCOR) 40 MG tablet Take 1 tablet (40 mg total) by mouth daily. 90 tablet 3  . traMADol (ULTRAM) 50 MG tablet Take 1-2 tablets by mouth every 8 (eight) hours as needed.  0  . triamterene-hydrochlorothiazide (MAXZIDE-25) 37.5-25 MG per tablet take 1 tablet by mouth every morning 90 tablet 3  . VOLTAREN 1 % GEL   0  . zonisamide (ZONEGRAN) 25 MG capsule Take 50 mg by mouth daily. Increase as directed  0  . [DISCONTINUED] ferrous sulfate 325 (65 FE) MG tablet Take 325 mg by mouth daily with breakfast.       No current facility-administered medications for this visit.    Allergies-reviewed and updated No Known Allergies  Social History   Social History  . Marital Status: Married    Spouse Name: N/A  . Number of Children: N/A  . Years of Education: N/A   Social History Main Topics  . Smoking status: Former Research scientist (life sciences)  . Smokeless tobacco: Never Used  . Alcohol Use: No  . Drug Use: No  . Sexual Activity: Not Asked   Other Topics Concern  . None   Social History Narrative    Objective: Temp(Src)  97.5 F (36.4 C) (Oral)  Ht 5\' 4"  (1.626 m)  Wt 144 lb 14.4 oz (65.726 kg)  BMI 24.86 kg/m2 GENERAL: vitals reviewed and listed above, alert, oriented, appears well hydrated and in no acute distress  HEENT: atraumatic, conjunttiva clear, no obvious abnormalities on inspection of external nose and ears. She is wearing glasses. TM's visualized. No cerumen impaction.   NECK: Neck is soft and supple without masses, no adenopathy or thyromegaly, trachea midline, no JVD. Normal range of motion.   LUNGS: clear to auscultation bilaterally, no wheezes, rales or rhonchi, good air movement  CV: Regular rate and rhythm, normal S1/S2, no audible murmurs, gallops, or rubs. No carotid bruit and no peripheral edema.   MS: moves all extremities without noticeable abnormality. No edema noted  Abd: soft/nontender/nondistended/normal bowel sounds   Skin:  warm and dry, no rash   Extremities: No clubbing, cyanosis, or edema. Capillary refill is WNL. Pulses intact bilaterally in upper and lower extremities.  Breast Exam: Dense breast tissue. No lumps, masses, dimpling or discharge noted.   Neuro: CN II-XII intact, sensation and reflexes normal throughout, 5/5 muscle strength in bilateral upper and lower extremities. Normal finger to nose. Normal rapid alternating movements.   PSYCH: pleasant and cooperative, no obvious depression or anxiety  Assessment/Plan:  1. Hyperlipidemia - Basic metabolic panel - CBC with Differential/Platelet - Hepatic function panel - Lipid panel - POCT urinalysis dipstick - TSH  2. Essential hypertension - Basic metabolic panel - CBC with Differential/Platelet - Hepatic function panel - Lipid panel - POCT urinalysis dipstick - TSH  3. Postmenopausal - DG Bone Density; Future  4. Osteoarthritis of both hands, unspecified osteoarthritis type - meloxicam (MOBIC) 7.5 MG tablet; Take 1 tablet (7.5 mg total) by mouth daily.  Dispense: 30 tablet; Refill: 3 -  Ambulatory referral to Pain Clinic  5. Medicare annual wellness visit, subsequent - Follow up in one year for MWE, follow up sooner if needed - Stressed importance of regular exercise - Continue to eat healthy.  - Information given on health care POA and living will.   Return precautions advised.    . These are the goals we discussed: Goals    . Increase physical activity    . Reduce sodium intake       This is a list of the screening recommended for you and due dates:  Health Maintenance  Topic Date Due  .  Hepatitis C: One time screening is recommended by Center for Disease Control  (CDC) for  adults born from [birth year] through 1963.   1946-02-27  . DEXA scan (bone density measurement)  10/07/2012  . Pneumonia vaccines (2 of 2 - PPSV23) 10/07/2014  . Flu Shot  10/26/2015  . Mammogram  07/30/2016  . Colon Cancer Screening  02/26/2017  . Tetanus Vaccine  10/29/2024  . Shingles Vaccine  Completed     Dorothyann Peng, AGNP

## 2014-12-04 NOTE — Telephone Encounter (Signed)
Informed patient of her labs via phone

## 2014-12-09 ENCOUNTER — Other Ambulatory Visit: Payer: Self-pay | Admitting: Family Medicine

## 2014-12-09 ENCOUNTER — Other Ambulatory Visit: Payer: Self-pay | Admitting: Adult Health

## 2014-12-09 DIAGNOSIS — M4316 Spondylolisthesis, lumbar region: Secondary | ICD-10-CM | POA: Diagnosis not present

## 2014-12-09 DIAGNOSIS — K589 Irritable bowel syndrome without diarrhea: Secondary | ICD-10-CM

## 2014-12-09 DIAGNOSIS — M4806 Spinal stenosis, lumbar region: Secondary | ICD-10-CM | POA: Diagnosis not present

## 2014-12-09 DIAGNOSIS — M545 Low back pain: Secondary | ICD-10-CM | POA: Diagnosis not present

## 2014-12-09 DIAGNOSIS — M5441 Lumbago with sciatica, right side: Secondary | ICD-10-CM | POA: Diagnosis not present

## 2014-12-09 DIAGNOSIS — M25552 Pain in left hip: Secondary | ICD-10-CM | POA: Diagnosis not present

## 2014-12-09 DIAGNOSIS — M5416 Radiculopathy, lumbar region: Secondary | ICD-10-CM | POA: Diagnosis not present

## 2014-12-09 MED ORDER — HYOSCYAMINE SULFATE 0.125 MG PO TABS: ORAL_TABLET | ORAL | Status: DC

## 2014-12-11 ENCOUNTER — Encounter: Payer: Self-pay | Admitting: Podiatry

## 2014-12-11 ENCOUNTER — Ambulatory Visit (INDEPENDENT_AMBULATORY_CARE_PROVIDER_SITE_OTHER): Payer: Medicare Other | Admitting: Podiatry

## 2014-12-11 VITALS — BP 95/42 | HR 82 | Resp 18

## 2014-12-11 DIAGNOSIS — B353 Tinea pedis: Secondary | ICD-10-CM

## 2014-12-11 NOTE — Patient Instructions (Signed)

## 2014-12-11 NOTE — Progress Notes (Signed)
   Subjective:    Patient ID: Kelsey Sparks, female    DOB: 21-Mar-1946, 69 y.o.   MRN: [id number]  HPI  69 year old female presents the office today with concerns of skin "pulling back" on the fifth toes. She states this happened approximately 3 weeks ago. She started applying antibiotic ointment over the area which seemed to resolve the issue. She currently states the areas healed however she did have it checked. She denies any itching interdigitally. She denies any open sores or swelling. No pain to the area No other complaints at this time.  Review of Systems  Constitutional:       Night sweats  All other systems reviewed and are negative.      Objective:   Physical Exam AAO x3, NAD DP/PT pulses palpable bilaterally, CRT less than 3 seconds Protective sensation intact with Simms Weinstein monofilament, vibratory sensation intact, Achilles tendon reflex intact On the fourth interspaces bilaterally there is a very small amount of peeling, erythematous skin which may be resolving tinea pedis. There is currently no open lesions or pre-ulcerative lesions. There is no evidence of epidermolysis, skin fissures. No areas of tenderness to bilateral lower extremities. MMT 5/5, ROM WNL.  No open lesions or pre-ulcerative lesions.  No overlying edema, erythema, increase in warmth to bilateral lower extremities.  No pain with calf compression, swelling, warmth, erythema bilaterally.      Assessment & Plan:  69 year old female with possibly resolving tinea pedis bilateral fourth interspace -Treatment options discussed including all alternatives, risks, and complications -Recommended OTC Lamisil cream. -Monitor for any reoccurrence. -Follow-up if symptoms reoccur or sooner if any problems arise. In the meantime, encouraged to call the office with any questions, concerns, change in symptoms.   Celesta Gentile, DPM

## 2014-12-12 ENCOUNTER — Other Ambulatory Visit: Payer: Self-pay | Admitting: Family Medicine

## 2014-12-24 DIAGNOSIS — M5416 Radiculopathy, lumbar region: Secondary | ICD-10-CM | POA: Diagnosis not present

## 2015-01-12 DIAGNOSIS — G43019 Migraine without aura, intractable, without status migrainosus: Secondary | ICD-10-CM | POA: Diagnosis not present

## 2015-01-14 DIAGNOSIS — M545 Low back pain: Secondary | ICD-10-CM | POA: Diagnosis not present

## 2015-01-14 DIAGNOSIS — M4806 Spinal stenosis, lumbar region: Secondary | ICD-10-CM | POA: Diagnosis not present

## 2015-01-14 DIAGNOSIS — M25552 Pain in left hip: Secondary | ICD-10-CM | POA: Diagnosis not present

## 2015-01-14 DIAGNOSIS — Z6825 Body mass index (BMI) 25.0-25.9, adult: Secondary | ICD-10-CM | POA: Diagnosis not present

## 2015-01-14 DIAGNOSIS — M4316 Spondylolisthesis, lumbar region: Secondary | ICD-10-CM | POA: Diagnosis not present

## 2015-01-14 DIAGNOSIS — M5441 Lumbago with sciatica, right side: Secondary | ICD-10-CM | POA: Diagnosis not present

## 2015-01-14 DIAGNOSIS — M5416 Radiculopathy, lumbar region: Secondary | ICD-10-CM | POA: Diagnosis not present

## 2015-02-05 ENCOUNTER — Encounter: Payer: Self-pay | Admitting: Physical Medicine & Rehabilitation

## 2015-02-24 ENCOUNTER — Encounter: Payer: Medicare Other | Attending: Physical Medicine & Rehabilitation | Admitting: Physical Medicine & Rehabilitation

## 2015-02-24 ENCOUNTER — Encounter: Payer: Self-pay | Admitting: Physical Medicine & Rehabilitation

## 2015-02-24 ENCOUNTER — Other Ambulatory Visit: Payer: Self-pay | Admitting: Physical Medicine & Rehabilitation

## 2015-02-24 VITALS — BP 106/75 | HR 83

## 2015-02-24 DIAGNOSIS — R42 Dizziness and giddiness: Secondary | ICD-10-CM | POA: Diagnosis not present

## 2015-02-24 DIAGNOSIS — M4806 Spinal stenosis, lumbar region: Secondary | ICD-10-CM | POA: Diagnosis not present

## 2015-02-24 DIAGNOSIS — G894 Chronic pain syndrome: Secondary | ICD-10-CM | POA: Diagnosis not present

## 2015-02-24 DIAGNOSIS — Z87891 Personal history of nicotine dependence: Secondary | ICD-10-CM | POA: Diagnosis not present

## 2015-02-24 DIAGNOSIS — M19012 Primary osteoarthritis, left shoulder: Secondary | ICD-10-CM

## 2015-02-24 DIAGNOSIS — I10 Essential (primary) hypertension: Secondary | ICD-10-CM | POA: Diagnosis not present

## 2015-02-24 DIAGNOSIS — E785 Hyperlipidemia, unspecified: Secondary | ICD-10-CM | POA: Insufficient documentation

## 2015-02-24 DIAGNOSIS — I739 Peripheral vascular disease, unspecified: Secondary | ICD-10-CM | POA: Diagnosis not present

## 2015-02-24 DIAGNOSIS — Z5181 Encounter for therapeutic drug level monitoring: Secondary | ICD-10-CM | POA: Insufficient documentation

## 2015-02-24 DIAGNOSIS — H3530 Unspecified macular degeneration: Secondary | ICD-10-CM | POA: Insufficient documentation

## 2015-02-24 DIAGNOSIS — M19041 Primary osteoarthritis, right hand: Secondary | ICD-10-CM | POA: Insufficient documentation

## 2015-02-24 DIAGNOSIS — K219 Gastro-esophageal reflux disease without esophagitis: Secondary | ICD-10-CM | POA: Diagnosis not present

## 2015-02-24 DIAGNOSIS — K449 Diaphragmatic hernia without obstruction or gangrene: Secondary | ICD-10-CM | POA: Insufficient documentation

## 2015-02-24 DIAGNOSIS — M19042 Primary osteoarthritis, left hand: Secondary | ICD-10-CM | POA: Insufficient documentation

## 2015-02-24 DIAGNOSIS — M1712 Unilateral primary osteoarthritis, left knee: Secondary | ICD-10-CM | POA: Insufficient documentation

## 2015-02-24 DIAGNOSIS — M5126 Other intervertebral disc displacement, lumbar region: Secondary | ICD-10-CM | POA: Diagnosis not present

## 2015-02-24 DIAGNOSIS — Z79899 Other long term (current) drug therapy: Secondary | ICD-10-CM | POA: Diagnosis not present

## 2015-02-24 DIAGNOSIS — M19019 Primary osteoarthritis, unspecified shoulder: Secondary | ICD-10-CM | POA: Insufficient documentation

## 2015-02-24 DIAGNOSIS — M19011 Primary osteoarthritis, right shoulder: Secondary | ICD-10-CM | POA: Diagnosis not present

## 2015-02-24 DIAGNOSIS — K589 Irritable bowel syndrome without diarrhea: Secondary | ICD-10-CM | POA: Diagnosis not present

## 2015-02-24 MED ORDER — DICLOFENAC SODIUM 1 % TD GEL: 2.0000 g | Freq: Three times a day (TID) | TRANSDERMAL | Status: DC

## 2015-02-24 NOTE — Addendum Note (Signed)
Addended by: Geryl Rankins D on: 02/24/2015 03:19 PM   Modules accepted: Orders

## 2015-02-24 NOTE — Patient Instructions (Addendum)
CONSIDER USE OF HEAT FOR YOUR HANDS PARTICULARLY BEFORE ACTIVITIES.   OTHER SUPPLEMENTS WHICH MIGHT HELP INCLUDE: GARLIC SUPPLEMENT, OMEGA THREE FATTY ACIDS

## 2015-02-24 NOTE — Progress Notes (Signed)
Subjective:    Patient ID: Kelsey Sparks, female    DOB: 09/11/1945, 69 y.o.   MRN: [id number]  HPI   This is an initial visit for Kelsey Sparks, a 69 yo female who has a history of multiple pain complaints. She has had chronic low back pain since the 80's with subsequent surgery. She has been seeing Dr. Clydell Hakim for lumbar spine injections here in town. The primary reason she is here today for pain and "arthritis" in her hands. She also notes that she has osteoarthritis in many areas including her shoulders, knees, and feet.  These other areas are not "acting up" at present but can be problematic from time to time. The left knee will occasionally hurt if she walks a prolonged distance--sometimes giving out. She periodically will note right shoulder pain with radiation down the arm if she is active with her upper extremities.   Her PCP rx'ed meloxicam which she found helpful. She's taking 7.5mg  daily. She has used voltaren gel in the past but it was stopped due to non-coverage by insurance. She is wearing bilateral gloves to keep her hands warm. Her hands tend to be worse in the winter/colder months. The pain her hands is generally in her fingers. She denies wrist or proximal joint pain. She has had occasional numbness in her hands but this is rare. 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   Additionally for pain she uses an occasional tramadol and over the counter joint supplements   She had an MRI of her lumbar spine done in 2013 which I reviewed  L2-L3: Negative disc. Mild facet hypertrophy.  L3-L4: Grade 1 anterolisthesis. Broad-based disc/pseudo disc protrusion. Severe facet degeneration which has progressed since 2005. Moderate ligament flavum hypertrophy. Lateral compression on the thecal sac with overall mild spinal stenosis. Mild bilateral lateral recess stenosis. Mild left greater than right L3 foraminal  stenosis.  L4-L5: Chronic mild circumferential disc bulges stable. Tiny chronic right lateral recess partial discectomy defect. Partial laminectomy changes again noted on the right. Moderate facet and left ligament flavum hypertrophy has mildly increased. No spinal stenosis. No significant lateral recess stenosis. Mild L4 foraminal stenosis is not significantly changed.  L5-S1: Stable anterior eccentric disc osteophyte complex. Stable mild facet hypertrophy. Capacious thecal sac at this level. No spinal or lateral recess stenosis. Mild left greater than right L5 foraminal stenosis is stable and mostly related to endplate spurring.  Apparently she had another MRI this summer which showed advanced compression of the right L3 nerve root. Injections were performed which apparently have alleviated a lot of her symptoms.    Pain Inventory Average Pain 8 Pain Right Now 9 My pain is sharp and aching  In the last 24 hours, has pain interfered with the following? General activity 1 Relation with others 0 Enjoyment of life 0 What TIME of day is your pain at its worst? night Sleep (in general) Fair  Pain is worse with: bending, inactivity and unsure Pain improves with: rest, heat/ice, medication and injections Relief from Meds: 8  Mobility walk without assistance how many minutes can you walk? 10 ability to climb steps?  yes do you drive?  yes transfers alone Do you have any goals in this area?  yes  Function retired I need assistance with the following:  household duties  Neuro/Psych weakness dizziness  Prior Studies Any changes since last visit?  no  Physicians involved in  your care Any changes since last visit?  no   Family History  Problem Relation Age of Onset  . Arthritis Mother   . Asthma Mother   . Arthritis Father   . Hypertension Father   . Breast cancer Maternal Aunt   . Colon cancer Neg Hx    Social History   Social History  . Marital  Status: Married    Spouse Name: N/A  . Number of Children: N/A  . Years of Education: N/A   Social History Main Topics  . Smoking status: Former Research scientist (life sciences)  . Smokeless tobacco: Never Used  . Alcohol Use: No  . Drug Use: No  . Sexual Activity: Not Asked   Other Topics Concern  . None   Social History Narrative   Past Surgical History  Procedure Laterality Date  . Total abdominal hysterectomy w/ bilateral salpingoophorectomy    . Lumbar disc surgery    . Knee surgery    . Cholecystectomy    . Back surgery    . Neck surgery    . Colonoscopy    . Upper gastrointestinal endoscopy    . Polypectomy     Past Medical History  Diagnosis Date  . Allergy   . Hyperlipidemia   . Hypertension   . Low back pain   . Peripheral vascular disease ([place])   . Muscular degeneration   . Anemia   . Arthritis   . GERD (gastroesophageal reflux disease)   . Adenomatous colon polyp   . Esophageal stricture   . IBS (irritable bowel syndrome)   . Hiatal hernia   . Macular degeneration    BP 106/75 mmHg  Pulse 83  SpO2 99%  Opioid Risk Score:   Fall Risk Score:  `1  Depression screen PHQ 2/9  Depression screen [facility] 2/9 02/24/2015 10/27/2014 10/06/2013  Decreased Interest 2 0 0  Down, Depressed, Hopeless 0 0 -  PHQ - 2 Score 2 0 0  Altered sleeping 1 - -  Tired, decreased energy 2 - -  Change in appetite 0 - -  Feeling bad or failure about yourself  0 - -  Trouble concentrating 0 - -  Moving slowly or fidgety/restless 1 - -  Suicidal thoughts 0 - -  PHQ-9 Score 6 - -  Difficult doing work/chores Not difficult at all - -     Review of Systems  Constitutional: Positive for diaphoresis and unexpected weight change.  Gastrointestinal: Positive for constipation.  Neurological: Positive for dizziness and weakness.  All other systems reviewed and are negative.      Objective:   Physical Exam   General: Alert and oriented x 3, No apparent distress HEENT: Head is normocephalic,  atraumatic, PERRLA, EOMI, sclera anicteric, oral mucosa pink and moist, dentition intact, ext ear canals clear,  Neck: Supple without JVD or lymphadenopathy Heart: Reg rate and rhythm. No murmurs rubs or gallops Chest: CTA bilaterally without wheezes, rales, or rhonchi; no distress Abdomen: Soft, non-tender, non-distended, bowel sounds positive. Extremities: No clubbing, cyanosis, or edema. Pulses are 2+ Skin: Clean and intact without signs of breakdown Neuro: Pt is cognitively appropriate with normal insight, memory, and awareness. Cranial nerves 2-12 are intact. Sensory exam is normal. Reflexes are 2+ in all 4's. Fine motor coordination is intact. No tremors. Motor function is grossly 5/5.  Musculoskeletal: Full ROM, No pain with AROM or PROM in the neck, trunk, or extremities. Posture appropriate. She has mild pain with palpation at all of her PIP's and DIP's  of both hands--right hand more tender than the left. There is mild sclerosis at the joint but no major joint deformities. She struggle with finger flexion and finger opposition to a slight extent. Wrists were non-tender as were MCP's. Tinel's negative. She displayed mild medial joint line tenderness along the left knee. Meniscal maneuvers were negative. No knee instability was seen. Mild crepitus was appreciated left knee. No antalgia with gait. Right shoulder with fair preservation of range, minimal pain with IR/ER today. Psych: Pt's affect is appropriate. Pt is cooperative        Assessment & Plan:  1. Osteoarthritis of bilateral hands/fingers 2. Osteoarthritis left knee 3. DJD/mild rotator cuff tendonitis right shoulder 4. 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 At this point, her care has been generally appropriate. I would favor staying rather conservative at this point.  2. I would like to reintroduce voltaren gel for her hands/knee as it wouldn't be wise to push her  meloxicam any higher given age, Cr clearance, etc. I sent the rx through her mail order pharmacy.  3. Encouraged the ongoing use of her joint supplements. Also recommended garlic, omega 3 fatty acids. 4. Heat would also be of some use at times for her hands 5. Consider more interventional treatment of her joint pain as the situation demands.   I would like to thank NP Dorothyann Peng for the opportunity to treat this pleasant lady. I will see her back in about 2 months. Thirty minutes of face to face patient care time were spent during this visit. All questions were encouraged and answered.

## 2015-02-25 LAB — PMP ALCOHOL METABOLITE (ETG): Ethyl Glucuronide (EtG): NEGATIVE ng/mL

## 2015-02-27 LAB — TRAMADOL, URINE
N-DESMETHYL-CIS-TRAMADOL: 5939 ng/mL (ref <100)
TRAMADOL, URINE: 14862 ng/mL (ref <100)

## 2015-03-02 LAB — PRESCRIPTION MONITORING PROFILE (SOLSTAS)
AMPHETAMINE/METH: NEGATIVE ng/mL
BARBITURATE SCREEN, URINE: NEGATIVE ng/mL
BENZODIAZEPINE SCREEN, URINE: NEGATIVE ng/mL
Buprenorphine, Urine: NEGATIVE ng/mL
CARISOPRODOL, URINE: NEGATIVE ng/mL
Cannabinoid Scrn, Ur: NEGATIVE ng/mL
Cocaine Metabolites: NEGATIVE ng/mL
Creatinine, Urine: 236.44 mg/dL (ref >20.0)
ECSTASY: NEGATIVE ng/mL
FENTANYL URINE: NEGATIVE ng/mL
METHADONE SCREEN, URINE: NEGATIVE ng/mL
Meperidine, Ur: NEGATIVE ng/mL
NITRITES URINE, INITIAL: NEGATIVE ug/mL
Opiate Screen, Urine: NEGATIVE ng/mL
Oxycodone Screen, Ur: NEGATIVE ng/mL
PROPOXYPHENE: NEGATIVE ng/mL
TAPENTADOLUR: NEGATIVE ng/mL
Zolpidem, Urine: NEGATIVE ng/mL
pH, Initial: 5.1 pH (ref 4.5–8.9)

## 2015-03-03 ENCOUNTER — Ambulatory Visit (INDEPENDENT_AMBULATORY_CARE_PROVIDER_SITE_OTHER): Payer: Medicare Other | Admitting: Podiatry

## 2015-03-03 ENCOUNTER — Encounter: Payer: Self-pay | Admitting: Podiatry

## 2015-03-03 VITALS — BP 106/64 | HR 81 | Resp 14

## 2015-03-03 DIAGNOSIS — M79676 Pain in unspecified toe(s): Secondary | ICD-10-CM

## 2015-03-03 DIAGNOSIS — B351 Tinea unguium: Secondary | ICD-10-CM

## 2015-03-03 NOTE — Progress Notes (Signed)
   Subjective:    Patient ID: Kelsey Sparks, female    DOB: 1946/02/23, 69 y.o.   MRN: [id number]  HPI this patient presents to the office with chief complaint of long thick painful nails. She states the nails are painful as she walks and wears her shoes. She states she has been especially painful. Fourth toenail left foot that she injured approximately 10 years ago. She presents to the office for preventive foot care services The patient presents here today for b/l toenail debridement.    Review of Systems  All other systems reviewed and are negative.      Objective:   Physical Exam GENERAL APPEARANCE: Alert, conversant. Appropriately groomed. No acute distress.  VASCULAR: Pedal pulses palpable at  [facility] and PT bilateral.  Capillary refill time is immediate to all digits,  Normal temperature gradient.  Digital hair growth is present bilateral  NEUROLOGIC: sensation is normal to 5.07 monofilament at 5/5 sites bilateral.  Light touch is intact bilateral, Muscle strength normal.  MUSCULOSKELETAL: acceptable muscle strength, tone and stability bilateral.  Intrinsic muscluature intact bilateral.  Rectus appearance of foot and digits noted bilateral.   DERMATOLOGIC: skin color, texture, and turgor are within normal limits.  No preulcerative lesions or ulcers  are seen, no interdigital maceration noted.  No open lesions present. . No drainage noted.NAILS  Thick disfigured discolored nails both feet.         Assessment & Plan:  Onychomycosis  B/L    Debridement of Nails Both feet.  Discussed and proposed surgery for permanent removal fourth toenail left foot. In January.  RTC 3 months for nail care.  Gardiner Barefoot DPM

## 2015-03-03 NOTE — Progress Notes (Signed)
Urine drug screen for this encounter is consistent for prescribed medication 

## 2015-03-09 DIAGNOSIS — M545 Low back pain: Secondary | ICD-10-CM | POA: Diagnosis not present

## 2015-03-09 DIAGNOSIS — M4806 Spinal stenosis, lumbar region: Secondary | ICD-10-CM | POA: Diagnosis not present

## 2015-04-05 ENCOUNTER — Other Ambulatory Visit: Payer: Self-pay | Admitting: Adult Health

## 2015-04-07 ENCOUNTER — Other Ambulatory Visit: Payer: Self-pay | Admitting: *Deleted

## 2015-04-07 DIAGNOSIS — Z23 Encounter for immunization: Secondary | ICD-10-CM

## 2015-04-08 MED ORDER — ESTROGENS, CONJUGATED 0.625 MG/GM VA CREA: TOPICAL_CREAM | VAGINAL | Status: DC

## 2015-04-12 DIAGNOSIS — M545 Low back pain: Secondary | ICD-10-CM | POA: Diagnosis not present

## 2015-04-12 DIAGNOSIS — M5416 Radiculopathy, lumbar region: Secondary | ICD-10-CM | POA: Diagnosis not present

## 2015-04-12 DIAGNOSIS — M4316 Spondylolisthesis, lumbar region: Secondary | ICD-10-CM | POA: Diagnosis not present

## 2015-04-12 DIAGNOSIS — M4806 Spinal stenosis, lumbar region: Secondary | ICD-10-CM | POA: Diagnosis not present

## 2015-04-12 DIAGNOSIS — M25552 Pain in left hip: Secondary | ICD-10-CM | POA: Diagnosis not present

## 2015-04-12 DIAGNOSIS — M5441 Lumbago with sciatica, right side: Secondary | ICD-10-CM | POA: Diagnosis not present

## 2015-04-14 DIAGNOSIS — G43019 Migraine without aura, intractable, without status migrainosus: Secondary | ICD-10-CM | POA: Diagnosis not present

## 2015-04-26 ENCOUNTER — Encounter: Payer: Self-pay | Admitting: Physical Medicine & Rehabilitation

## 2015-04-26 ENCOUNTER — Encounter: Payer: Medicare Other | Attending: Physical Medicine & Rehabilitation | Admitting: Physical Medicine & Rehabilitation

## 2015-04-26 VITALS — BP 97/65 | HR 70 | Resp 14

## 2015-04-26 DIAGNOSIS — M19042 Primary osteoarthritis, left hand: Secondary | ICD-10-CM | POA: Diagnosis not present

## 2015-04-26 DIAGNOSIS — K589 Irritable bowel syndrome without diarrhea: Secondary | ICD-10-CM | POA: Insufficient documentation

## 2015-04-26 DIAGNOSIS — M5126 Other intervertebral disc displacement, lumbar region: Secondary | ICD-10-CM | POA: Insufficient documentation

## 2015-04-26 DIAGNOSIS — Z87891 Personal history of nicotine dependence: Secondary | ICD-10-CM | POA: Insufficient documentation

## 2015-04-26 DIAGNOSIS — E785 Hyperlipidemia, unspecified: Secondary | ICD-10-CM | POA: Diagnosis not present

## 2015-04-26 DIAGNOSIS — I739 Peripheral vascular disease, unspecified: Secondary | ICD-10-CM | POA: Insufficient documentation

## 2015-04-26 DIAGNOSIS — G894 Chronic pain syndrome: Secondary | ICD-10-CM | POA: Diagnosis not present

## 2015-04-26 DIAGNOSIS — M4806 Spinal stenosis, lumbar region: Secondary | ICD-10-CM | POA: Diagnosis not present

## 2015-04-26 DIAGNOSIS — I10 Essential (primary) hypertension: Secondary | ICD-10-CM | POA: Insufficient documentation

## 2015-04-26 DIAGNOSIS — K219 Gastro-esophageal reflux disease without esophagitis: Secondary | ICD-10-CM | POA: Diagnosis not present

## 2015-04-26 DIAGNOSIS — Z79899 Other long term (current) drug therapy: Secondary | ICD-10-CM | POA: Insufficient documentation

## 2015-04-26 DIAGNOSIS — Z5181 Encounter for therapeutic drug level monitoring: Secondary | ICD-10-CM | POA: Diagnosis not present

## 2015-04-26 DIAGNOSIS — M19011 Primary osteoarthritis, right shoulder: Secondary | ICD-10-CM | POA: Insufficient documentation

## 2015-04-26 DIAGNOSIS — M1712 Unilateral primary osteoarthritis, left knee: Secondary | ICD-10-CM | POA: Diagnosis not present

## 2015-04-26 DIAGNOSIS — H3530 Unspecified macular degeneration: Secondary | ICD-10-CM | POA: Diagnosis not present

## 2015-04-26 DIAGNOSIS — M19012 Primary osteoarthritis, left shoulder: Secondary | ICD-10-CM | POA: Insufficient documentation

## 2015-04-26 DIAGNOSIS — K449 Diaphragmatic hernia without obstruction or gangrene: Secondary | ICD-10-CM | POA: Diagnosis not present

## 2015-04-26 DIAGNOSIS — M19041 Primary osteoarthritis, right hand: Secondary | ICD-10-CM | POA: Insufficient documentation

## 2015-04-26 DIAGNOSIS — R42 Dizziness and giddiness: Secondary | ICD-10-CM | POA: Diagnosis not present

## 2015-04-26 NOTE — Patient Instructions (Signed)
PLEASE CALL ME WITH ANY PROBLEMS OR QUESTIONS [id number]).    TRY HEAT ON YOUR SHOULDERS AND BOTH HANDS IN THE MORNING WHEN YOU FIRST GET OUT OF THE BED   CONSIDER GARLIC SUPPLEMENT AND OMEGA THREE FATTY ACIDS ALSO FOR JOINT HEALTH

## 2015-04-26 NOTE — Progress Notes (Signed)
Subjective:    Patient ID: Kelsey Sparks, female    DOB: Sep 10, 1945, 70 y.o.   MRN: [id number]  HPI   [name] is here in follow up of her chronic pain. She has been struggling with her pain in the colder winter months. It's difficult to get up in the morning. She hasn't had any falls.   She was able to get the voltaren gel which helps somewhat. Her tramadol helps her pain somewhat. (she was unable to tolerate hydrocodone). She takes meloxicam 7.5mg  daily. Gabapentin 200-300mg  daily---she doesn't take more because it affects her vision.   She stays active most days with exercise as well as the care of her husband.    Pain Inventory Average Pain 5 Pain Right Now 5 My pain is aching  In the last 24 hours, has pain interfered with the following? General activity 6 Relation with others 0 Enjoyment of life 9 What TIME of day is your pain at its worst? morning, night Sleep (in general) Good  Pain is worse with: bending, inactivity and standing Pain improves with: rest, heat/ice, medication and injections Relief from Meds: 6  Mobility walk without assistance how many minutes can you walk? 5 ability to climb steps?  yes do you drive?  yes transfers alone Do you have any goals in this area?  yes  Function retired I need assistance with the following:  household duties Do you have any goals in this area?  yes  Neuro/Psych numbness dizziness  Prior Studies Any changes since last visit?  no  Physicians involved in your care Any changes since last visit?  no   Family History  Problem Relation Age of Onset  . Arthritis Mother   . Asthma Mother   . Arthritis Father   . Hypertension Father   . Breast cancer Maternal Aunt   . Colon cancer Neg Hx    Social History   Social History  . Marital Status: Married    Spouse Name: N/A  . Number of Children: N/A  . Years of Education: N/A   Social History Main Topics  . Smoking status: Former Research scientist (life sciences)  . Smokeless  tobacco: Never Used  . Alcohol Use: No  . Drug Use: No  . Sexual Activity: Not Asked   Other Topics Concern  . None   Social History Narrative   Past Surgical History  Procedure Laterality Date  . Total abdominal hysterectomy w/ bilateral salpingoophorectomy    . Lumbar disc surgery    . Knee surgery    . Cholecystectomy    . Back surgery    . Neck surgery    . Colonoscopy    . Upper gastrointestinal endoscopy    . Polypectomy     Past Medical History  Diagnosis Date  . Allergy   . Hyperlipidemia   . Hypertension   . Low back pain   . Peripheral vascular disease ([place])   . Muscular degeneration   . Anemia   . Arthritis   . GERD (gastroesophageal reflux disease)   . Adenomatous colon polyp   . Esophageal stricture   . IBS (irritable bowel syndrome)   . Hiatal hernia   . Macular degeneration    BP 97/65 mmHg  Pulse 70  Resp 14  SpO2 97%  Opioid Risk Score:   Fall Risk Score:  `1  Depression screen PHQ 2/9  Depression screen [facility] 2/9 02/24/2015 10/27/2014 10/06/2013  Decreased Interest 2 0 0  Down, Depressed, Hopeless 0 0 -  PHQ - 2 Score 2 0 0  Altered sleeping 1 - -  Tired, decreased energy 2 - -  Change in appetite 0 - -  Feeling bad or failure about yourself  0 - -  Trouble concentrating 0 - -  Moving slowly or fidgety/restless 1 - -  Suicidal thoughts 0 - -  PHQ-9 Score 6 - -  Difficult doing work/chores Not difficult at all - -     Review of Systems  Constitutional: Positive for diaphoresis and unexpected weight change.  Respiratory: Positive for shortness of breath.   Gastrointestinal: Positive for abdominal pain and constipation.  All other systems reviewed and are negative.      Objective:   Physical Exam General: Alert and oriented x 3, No apparent distress  HEENT: Head is normocephalic, atraumatic, PERRLA, EOMI, sclera anicteric, oral mucosa pink and moist, dentition intact, ext ear canals clear,  Neck: Supple without JVD or  lymphadenopathy  Heart: Reg rate and rhythm. No murmurs rubs or gallops  Chest: CTA bilaterally without wheezes, rales, or rhonchi; no distress  Abdomen: Soft, non-tender, non-distended, bowel sounds positive.  Extremities: No clubbing, cyanosis, or edema. Pulses are 2+  Skin: Clean and intact without signs of breakdown  Neuro: Pt is cognitively appropriate with normal insight, memory, and awareness. Cranial nerves 2-12 are intact. Sensory exam is normal. Reflexes are 2+ in all 4's. Fine motor coordination is intact. No tremors. Motor function is grossly 5/5.  Musculoskeletal: Full ROM, No pain with AROM or PROM in the neck, trunk, or extremities. Posture appropriate. She has mild pain with palpation at all of her PIP's and DIP's of both hands--right hand more tender than the left. There is mild sclerosis at the joint but no major joint deformities. She struggle with finger flexion and finger opposition to a slight extent. Wrists were non-tender as were MCP's. Tinel's negative. She has mild medial joint line tenderness along the left knee. Meniscal maneuvers were negative. No knee instability was seen. Mild crepitus was appreciated left knee still. No antalgia with gait. Right shoulder > left shoulder with pain in IR/ER as well as with impingement maneuvers.  Psych: Pt's affect is appropriate. Pt is cooperative    Assessment & Plan:   1. Osteoarthritis of bilateral hands/fingers  2. Osteoarthritis left knee  3. DJD/mild rotator cuff tendonitis right shoulder  4. 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 At this point, her care has been generally appropriate. I would favor staying rather conservative at this point.  2. Voltaren gel and low dose meloxicam for joint pain  3. Encouraged the ongoing use of her joint supplements. Can also use garlic, omega 3 fatty acids.  4. Heat would also be of some use at times for her hands and  shoulders. 5. Consider more interventional treatment of her joint pain as the situation demands. Could consider injections in the shoulders and hands.  I will see her back in about 3 months. 20 minutes of face to face patient care time were spent during this visit. All questions were encouraged and answered.

## 2015-04-29 ENCOUNTER — Ambulatory Visit (INDEPENDENT_AMBULATORY_CARE_PROVIDER_SITE_OTHER): Payer: Medicare Other | Admitting: Podiatry

## 2015-04-29 DIAGNOSIS — B351 Tinea unguium: Secondary | ICD-10-CM

## 2015-04-29 DIAGNOSIS — L600 Ingrowing nail: Secondary | ICD-10-CM

## 2015-04-29 DIAGNOSIS — M79676 Pain in unspecified toe(s): Secondary | ICD-10-CM

## 2015-04-29 DIAGNOSIS — D2372 Other benign neoplasm of skin of left lower limb, including hip: Secondary | ICD-10-CM | POA: Diagnosis not present

## 2015-04-29 NOTE — Progress Notes (Signed)
Subjective:     Patient ID: CHILD BEDWARD, female   DOB: March 29, 1945, 70 y.o.   MRN: [id number]  HPI this patient presents to the office for scheduled surgery for the permanent removal of the toenail on the fourth toenail left foot.  The nail is thick disfigured discolored and causes pain for this patient since she injured her nail previously   Review of Systems     Objective:   Physical Exam.      Objective:   Physical Exam GENERAL APPEARANCE: Alert, conversant. Appropriately groomed. No acute distress.  VASCULAR: Pedal pulses palpable at [facility] and PT bilateral. Capillary refill time is immediate to all digits, Normal temperature gradient. Digital hair growth is present bilateral  NEUROLOGIC: sensation is normal to 5.07 monofilament at 5/5 sites bilateral. Light touch is intact bilateral, Muscle strength normal.  MUSCULOSKELETAL: acceptable muscle strength, tone and stability bilateral. Intrinsic muscluature intact bilateral. Rectus appearance of foot and digits noted bilateral.   DERMATOLOGIC: skin color, texture, and turgor are within normal limits. No preulcerative lesions or ulcers are seen, no interdigital maceration noted. No open lesions present. . No drainage noted.NAILS Thick disfigured discolored nails both feet. Fourth toenail is particularly painful and disfigured.            Assessment:     Onychomycosis Fourth toenail left foot     Plan:    ROV  Nail surgery.Treatment options and alternatives discussed.  Recommended permanent phenol matrixectomy and patient agreed.  Fourth toenail  was prepped with alcohol and a toe block of 3cc of 2% lidocaine plain was administered in a digital toe block. .  The toe was then prepped with betadine solution .  The offending nail border was then excised and matrix tissue exposed.  Phenol was then applied to the matrix tissue followed by an alcohol wash.  Antibiotic ointment and a dry sterile dressing was applied.  The  patient was dispensed instructions for aftercare. During the surgery a small nodular mass was removed from proximal nail fold.  I will send this to [facility].     Gardiner Barefoot DPM

## 2015-05-06 ENCOUNTER — Ambulatory Visit (INDEPENDENT_AMBULATORY_CARE_PROVIDER_SITE_OTHER): Payer: Medicare Other | Admitting: Podiatry

## 2015-05-06 ENCOUNTER — Encounter: Payer: Self-pay | Admitting: Podiatry

## 2015-05-06 VITALS — BP 123/67 | HR 78 | Resp 14

## 2015-05-06 DIAGNOSIS — Z09 Encounter for follow-up examination after completed treatment for conditions other than malignant neoplasm: Secondary | ICD-10-CM

## 2015-05-06 NOTE — Progress Notes (Signed)
Patient ID: Kelsey Sparks, female   DOB: 06-Jul-1945, 70 y.o.   MRN: [id number] This patient returns to the office following nail surgery one week ago.  The patient says toe has been soaked and bandaged as directed.  There has been improvement of the toe since the surgery has been performed. The patient presents for continued evaluation and treatment. She says there is drainage but no pain.  She says she has developed a break in skin fifth toe.  GENERAL APPEARANCE: Alert, conversant. Appropriately groomed. No acute distress.  VASCULAR: Pedal pulses palpable at  [facility] and PT bilateral.  Capillary refill time is immediate to all digits,  Normal temperature gradient.    NEUROLOGIC: sensation is normal to 5.07 monofilament at 5/5 sites bilateral.  Light touch is intact bilateral, Muscle strength normal.  MUSCULOSKELETAL: acceptable muscle strength, tone and stability bilateral.  Intrinsic muscluature intact bilateral.  Rectus appearance of foot and digits noted bilateral.   DERMATOLOGIC: skin color, texture, and turgor are within normal limits.  No preulcerative lesions or ulcers  are seen, no interdigital maceration noted.   NAILS  There is necrotic tissue along the nail bed fourth toe left foot.  In the absence of redness swelling and pain.  DX  S/p nail surgery  ROV  Home instructions were discussed.  Patient to call the office if there are any questions or concerns.   Gardiner Barefoot DPM

## 2015-05-20 ENCOUNTER — Ambulatory Visit (INDEPENDENT_AMBULATORY_CARE_PROVIDER_SITE_OTHER): Payer: Medicare Other | Admitting: Podiatry

## 2015-05-20 ENCOUNTER — Encounter: Payer: Self-pay | Admitting: Podiatry

## 2015-05-20 VITALS — BP 108/60 | HR 69 | Resp 12

## 2015-05-20 DIAGNOSIS — Z09 Encounter for follow-up examination after completed treatment for conditions other than malignant neoplasm: Secondary | ICD-10-CM

## 2015-05-20 NOTE — Progress Notes (Signed)
Subjective:     Patient ID: Kelsey Sparks, female   DOB: 1945-12-04, 70 y.o.   MRN: [id number]  HPI this patient presents to the office following nail surgery for the elimination of the fourth toenail left foot. He states that the toe is crusted over, but no pain or discomfort is noted. She had also stated that her fifth toe on her left foot had become injured and she was told at the last visit to apply medication. The skin has now healed between the fourth and fifth toes and she is doing well   Review of Systems     Objective:   Physical Exam GENERAL APPEARANCE: Alert, conversant. Appropriately groomed. No acute distress.  VASCULAR: Pedal pulses palpable at  [facility] and PT bilateral.  Capillary refill time is immediate to all digits,  Normal temperature gradient.  Digital hair growth is present bilateral  NEUROLOGIC: sensation is normal to 5.07 monofilament at 5/5 sites bilateral.  Light touch is intact bilateral, Muscle strength normal.  MUSCULOSKELETAL: acceptable muscle strength, tone and stability bilateral.  Intrinsic muscluature intact bilateral.  Rectus appearance of foot and digits noted bilateral.   DERMATOLOGIC: skin color, texture, and turgor are within normal limits.  No preulcerative lesions or ulcers  are seen, no interdigital maceration noted.  No open lesions present.  Digital nails are asymptomatic. No drainage noted. NAILS  Healing crust over fourth toe nailbed.  No redness or swelling noted. Skin lesion between fourth and fifth toe left foot has healed.     Assessment:     S/p nail surgery     Plan:     ROV  Surgical site has healed with necrotic crust noted.   Gardiner Barefoot DPM

## 2015-05-27 ENCOUNTER — Encounter: Payer: Self-pay | Admitting: Podiatry

## 2015-06-02 ENCOUNTER — Encounter: Payer: Self-pay | Admitting: Podiatry

## 2015-06-02 ENCOUNTER — Ambulatory Visit (INDEPENDENT_AMBULATORY_CARE_PROVIDER_SITE_OTHER): Payer: Medicare Other | Admitting: Podiatry

## 2015-06-02 DIAGNOSIS — B351 Tinea unguium: Secondary | ICD-10-CM | POA: Diagnosis not present

## 2015-06-02 DIAGNOSIS — M79676 Pain in unspecified toe(s): Secondary | ICD-10-CM | POA: Diagnosis not present

## 2015-06-02 NOTE — Progress Notes (Signed)
Patient ID: Kelsey Sparks, female   DOB: October 26, 1945, 70 y.o.   MRN: [id number] Complaint:  Visit Type: Patient returns to my office for continued preventative foot care services. Complaint: Patient states" my nails have grown long and thick and become painful to walk and wear shoes" . The patient presents for preventative foot care services. No changes to ROS  Podiatric Exam: Vascular: dorsalis pedis and posterior tibial pulses are palpable bilateral. Capillary return is immediate. Temperature gradient is WNL. Skin turgor WNL  Sensorium: Normal Semmes Weinstein monofilament test. Normal tactile sensation bilaterally. Nail Exam: Pt has thick disfigured discolored nails with subungual debris noted bilateral entire nail hallux through fifth toenails Ulcer Exam: There is no evidence of ulcer or pre-ulcerative changes or infection. Orthopedic Exam: Muscle tone and strength are WNL. No limitations in general ROM. No crepitus or effusions noted. Foot type and digits show no abnormalities. Bony prominences are unremarkable. Skin: No Porokeratosis. No infection or ulcers  Diagnosis:  Onychomycosis, , Pain in right toe, pain in left toes  Treatment & Plan Procedures and Treatment: Consent by patient was obtained for treatment procedures. The patient understood the discussion of treatment and procedures well. All questions were answered thoroughly reviewed. Debridement of mycotic and hypertrophic toenails, 1 through 5 bilateral and clearing of subungual debris. No ulceration, no infection noted.  Return Visit-Office Procedure: Patient instructed to return to the office for a follow up visit 3 months for continued evaluation and treatment.    Gardiner Barefoot DPM

## 2015-06-06 ENCOUNTER — Other Ambulatory Visit: Payer: Self-pay | Admitting: Family Medicine

## 2015-06-09 ENCOUNTER — Other Ambulatory Visit: Payer: Self-pay | Admitting: Family Medicine

## 2015-06-25 ENCOUNTER — Encounter: Payer: Self-pay | Admitting: Podiatry

## 2015-07-15 DIAGNOSIS — G43019 Migraine without aura, intractable, without status migrainosus: Secondary | ICD-10-CM | POA: Diagnosis not present

## 2015-07-20 ENCOUNTER — Encounter: Payer: Self-pay | Admitting: Physical Medicine & Rehabilitation

## 2015-07-20 ENCOUNTER — Encounter: Payer: Medicare Other | Attending: Physical Medicine & Rehabilitation | Admitting: Physical Medicine & Rehabilitation

## 2015-07-20 VITALS — BP 115/71 | HR 70 | Resp 14

## 2015-07-20 DIAGNOSIS — M7581 Other shoulder lesions, right shoulder: Secondary | ICD-10-CM | POA: Insufficient documentation

## 2015-07-20 DIAGNOSIS — M4726 Other spondylosis with radiculopathy, lumbar region: Secondary | ICD-10-CM | POA: Insufficient documentation

## 2015-07-20 DIAGNOSIS — G8929 Other chronic pain: Secondary | ICD-10-CM | POA: Diagnosis not present

## 2015-07-20 DIAGNOSIS — M25512 Pain in left shoulder: Secondary | ICD-10-CM | POA: Insufficient documentation

## 2015-07-20 DIAGNOSIS — E785 Hyperlipidemia, unspecified: Secondary | ICD-10-CM | POA: Diagnosis not present

## 2015-07-20 DIAGNOSIS — H3530 Unspecified macular degeneration: Secondary | ICD-10-CM | POA: Diagnosis not present

## 2015-07-20 DIAGNOSIS — M19011 Primary osteoarthritis, right shoulder: Secondary | ICD-10-CM | POA: Diagnosis not present

## 2015-07-20 DIAGNOSIS — M19241 Secondary osteoarthritis, right hand: Secondary | ICD-10-CM | POA: Diagnosis not present

## 2015-07-20 DIAGNOSIS — M1712 Unilateral primary osteoarthritis, left knee: Secondary | ICD-10-CM

## 2015-07-20 DIAGNOSIS — G500 Trigeminal neuralgia: Secondary | ICD-10-CM | POA: Diagnosis not present

## 2015-07-20 DIAGNOSIS — K589 Irritable bowel syndrome without diarrhea: Secondary | ICD-10-CM | POA: Diagnosis not present

## 2015-07-20 DIAGNOSIS — R200 Anesthesia of skin: Secondary | ICD-10-CM | POA: Diagnosis not present

## 2015-07-20 DIAGNOSIS — M19041 Primary osteoarthritis, right hand: Secondary | ICD-10-CM | POA: Insufficient documentation

## 2015-07-20 DIAGNOSIS — I739 Peripheral vascular disease, unspecified: Secondary | ICD-10-CM | POA: Diagnosis not present

## 2015-07-20 DIAGNOSIS — M7582 Other shoulder lesions, left shoulder: Secondary | ICD-10-CM | POA: Insufficient documentation

## 2015-07-20 DIAGNOSIS — M19042 Primary osteoarthritis, left hand: Secondary | ICD-10-CM | POA: Diagnosis not present

## 2015-07-20 DIAGNOSIS — K449 Diaphragmatic hernia without obstruction or gangrene: Secondary | ICD-10-CM | POA: Diagnosis not present

## 2015-07-20 DIAGNOSIS — R202 Paresthesia of skin: Secondary | ICD-10-CM | POA: Insufficient documentation

## 2015-07-20 DIAGNOSIS — Z87891 Personal history of nicotine dependence: Secondary | ICD-10-CM | POA: Diagnosis not present

## 2015-07-20 DIAGNOSIS — R42 Dizziness and giddiness: Secondary | ICD-10-CM | POA: Insufficient documentation

## 2015-07-20 DIAGNOSIS — I10 Essential (primary) hypertension: Secondary | ICD-10-CM | POA: Insufficient documentation

## 2015-07-20 DIAGNOSIS — M961 Postlaminectomy syndrome, not elsewhere classified: Secondary | ICD-10-CM | POA: Diagnosis not present

## 2015-07-20 DIAGNOSIS — M179 Osteoarthritis of knee, unspecified: Secondary | ICD-10-CM

## 2015-07-20 DIAGNOSIS — K219 Gastro-esophageal reflux disease without esophagitis: Secondary | ICD-10-CM | POA: Insufficient documentation

## 2015-07-20 DIAGNOSIS — M19242 Secondary osteoarthritis, left hand: Secondary | ICD-10-CM | POA: Diagnosis not present

## 2015-07-20 MED ORDER — TRAMADOL HCL 50 MG PO TABS: 50.0000 mg | ORAL_TABLET | Freq: Three times a day (TID) | ORAL | Status: DC | PRN

## 2015-07-20 MED ORDER — GABAPENTIN 100 MG PO CAPS: 100.0000 mg | ORAL_CAPSULE | Freq: Two times a day (BID) | ORAL | Status: DC

## 2015-07-20 NOTE — Patient Instructions (Signed)

## 2015-07-20 NOTE — Progress Notes (Signed)
Subjective:    Patient ID: Kelsey Sparks, female    DOB: Jul 20, 1945, 70 y.o.   MRN: [id number]  HPI   Kelsey Sparks is here in follow up of her chronic pain. She has had increased left shoulder pain over the last few months. She has tried to "baby" the arm to an extent but has noticed continually increasing pain. The shoulder bothers her when she uses it overhead or even when she performs personal hygiene.   She is wearing her copper gloves which seem to help with her hand pain. She uses her tramadol fairly spairingly. She contines on 200mg  gabapentin at night.    Pain Inventory Average Pain 6 Pain Right Now 5 My pain is constant and aching  In the last 24 hours, has pain interfered with the following? General activity 7 Relation with others 0 Enjoyment of life 10 What TIME of day is your pain at its worst? morning, daytime, night Sleep (in general) Fair  Pain is worse with: walking, bending, inactivity, standing and some activites Pain improves with: heat/ice, therapy/exercise, pacing activities, medication and injections Relief from Meds: 8  Mobility walk without assistance how many minutes can you walk? 3 ability to climb steps?  yes do you drive?  yes transfers alone Do you have any goals in this area?  yes  Function retired  Neuro/Psych numbness tingling spasms dizziness  Prior Studies Any changes since last visit?  no  Physicians involved in your care Any changes since last visit?  no   Family History  Problem Relation Age of Onset  . Arthritis Mother   . Asthma Mother   . Arthritis Father   . Hypertension Father   . Breast cancer Maternal Aunt   . Colon cancer Neg Hx    Social History   Social History  . Marital Status: Married    Spouse Name: N/A  . Number of Children: N/A  . Years of Education: N/A   Social History Main Topics  . Smoking status: Former Research scientist (life sciences)  . Smokeless tobacco: Never Used  . Alcohol Use: No  . Drug Use: No  .  Sexual Activity: Not Asked   Other Topics Concern  . None   Social History Narrative   Past Surgical History  Procedure Laterality Date  . Total abdominal hysterectomy w/ bilateral salpingoophorectomy    . Lumbar disc surgery    . Knee surgery    . Cholecystectomy    . Back surgery    . Neck surgery    . Colonoscopy    . Upper gastrointestinal endoscopy    . Polypectomy     Past Medical History  Diagnosis Date  . Allergy   . Hyperlipidemia   . Hypertension   . Low back pain   . Peripheral vascular disease ([place])   . Muscular degeneration   . Anemia   . Arthritis   . GERD (gastroesophageal reflux disease)   . Adenomatous colon polyp   . Esophageal stricture   . IBS (irritable bowel syndrome)   . Hiatal hernia   . Macular degeneration    BP 115/71 mmHg  Pulse 70  Resp 14  SpO2 97%  Opioid Risk Score:   Fall Risk Score:  `1  Depression screen PHQ 2/9  Depression screen [facility] 2/9 02/24/2015 10/27/2014 10/06/2013  Decreased Interest 2 0 0  Down, Depressed, Hopeless 0 0 -  PHQ - 2 Score 2 0 0  Altered sleeping 1 - -  Tired, decreased energy  2 - -  Change in appetite 0 - -  Feeling bad or failure about yourself  0 - -  Trouble concentrating 0 - -  Moving slowly or fidgety/restless 1 - -  Suicidal thoughts 0 - -  PHQ-9 Score 6 - -  Difficult doing work/chores Not difficult at all - -     Review of Systems  Constitutional: Positive for diaphoresis and unexpected weight change.  Respiratory: Positive for shortness of breath.   Gastrointestinal: Positive for abdominal pain and constipation.  All other systems reviewed and are negative.      Objective:   Physical Exam  General: Alert and oriented x 3, No apparent distress  HEENT: Head is normocephalic, atraumatic, PERRLA, EOMI, sclera anicteric, oral mucosa pink and moist, dentition intact, ext ear canals clear,  Neck: Supple without JVD or lymphadenopathy  Heart: Reg rate and rhythm. No murmurs rubs or  gallops  Chest: CTA bilaterally without wheezes, rales, or rhonchi; no distress  Abdomen: Soft, non-tender, non-distended, bowel sounds positive.  Extremities: No clubbing, cyanosis, or edema. Pulses are 2+  Skin: Clean and intact without signs of breakdown  Neuro: Pt is cognitively appropriate with normal insight, memory, and awareness. Cranial nerves 2-12 are intact. Sensory exam is normal. Reflexes are 2+ in all 4's. Fine motor coordination is intact. No tremors. Motor function is grossly 5/5.  Musculoskeletal: Full ROM, No pain with AROM or PROM in the neck, trunk, or extremities. Posture appropriate. She has mild pain with palpation at all of her PIP's and DIP's of both hands--right hand more tender than the left. Continued mild sclerosis at the joint but no major joint deformities. She struggle with finger flexion and finger opposition to a slight extent. Wrists were non-tender as were MCP's. Tinel's negative.  mild medial joint line tenderness along the left knee. Meniscal maneuvers were negative. No knee instability was seen. Mild crepitus was appreciated left knee still. No antalgia with gait. Left shoulder pain with IR/ER as well as with impingement maneuvers.  Psych: Pt's affect is appropriate. Pt is cooperative   Assessment & Plan:   1. Osteoarthritis of bilateral hands/fingers  2. Osteoarthritis left knee  3. DJD/mild rotator cuff tendonitis right shoulder--improved. Now with LUE symptoms predominantly .   4. 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 After informed consent and preparation of the skin with betadine and isopropyl alcohol, I injected 6mg  (1cc) of celestone and 4cc of 1% lidocaine into  the left subacromial space via lateral approach. Additionally, aspiration was performed prior to injection. The patient tolerated well, and no complications were encountered. Afterward the area was cleaned and dressed. Post-  injection instructions were provided.  .  2. Voltaren gel and low dose meloxicam for joint pain  3. Continue supplements for joint pain including garlic, omega 3 fatty acids.  4. I refilled her tramadol 50mg  #30 today as well as gabapentin 100mg  #60  5. Extensive rotator cuff exercises were provided today.     I will see her back in about 3 months. 20 minutes of face to face patient care time were spent during this visit. All questions were encouraged and answered.

## 2015-07-27 DIAGNOSIS — H2512 Age-related nuclear cataract, left eye: Secondary | ICD-10-CM | POA: Diagnosis not present

## 2015-07-27 DIAGNOSIS — H2511 Age-related nuclear cataract, right eye: Secondary | ICD-10-CM | POA: Diagnosis not present

## 2015-07-27 DIAGNOSIS — H04121 Dry eye syndrome of right lacrimal gland: Secondary | ICD-10-CM | POA: Diagnosis not present

## 2015-07-27 DIAGNOSIS — H353130 Nonexudative age-related macular degeneration, bilateral, stage unspecified: Secondary | ICD-10-CM | POA: Diagnosis not present

## 2015-07-27 DIAGNOSIS — H02839 Dermatochalasis of unspecified eye, unspecified eyelid: Secondary | ICD-10-CM | POA: Diagnosis not present

## 2015-08-02 DIAGNOSIS — Z1231 Encounter for screening mammogram for malignant neoplasm of breast: Secondary | ICD-10-CM | POA: Diagnosis not present

## 2015-08-05 DIAGNOSIS — R922 Inconclusive mammogram: Secondary | ICD-10-CM | POA: Diagnosis not present

## 2015-08-05 DIAGNOSIS — R928 Other abnormal and inconclusive findings on diagnostic imaging of breast: Secondary | ICD-10-CM | POA: Diagnosis not present

## 2015-08-05 LAB — HM MAMMOGRAPHY

## 2015-09-01 ENCOUNTER — Ambulatory Visit (INDEPENDENT_AMBULATORY_CARE_PROVIDER_SITE_OTHER): Payer: Medicare Other | Admitting: Podiatry

## 2015-09-01 ENCOUNTER — Encounter: Payer: Self-pay | Admitting: Podiatry

## 2015-09-01 DIAGNOSIS — B351 Tinea unguium: Secondary | ICD-10-CM

## 2015-09-01 DIAGNOSIS — M79676 Pain in unspecified toe(s): Secondary | ICD-10-CM

## 2015-09-01 NOTE — Progress Notes (Signed)
Patient ID: Kelsey Sparks, female   DOB: October 26, 1945, 70 y.o.   MRN: [id number] Complaint:  Visit Type: Patient returns to my office for continued preventative foot care services. Complaint: Patient states" my nails have grown long and thick and become painful to walk and wear shoes" . The patient presents for preventative foot care services. No changes to ROS  Podiatric Exam: Vascular: dorsalis pedis and posterior tibial pulses are palpable bilateral. Capillary return is immediate. Temperature gradient is WNL. Skin turgor WNL  Sensorium: Normal Semmes Weinstein monofilament test. Normal tactile sensation bilaterally. Nail Exam: Pt has thick disfigured discolored nails with subungual debris noted bilateral entire nail hallux through fifth toenails Ulcer Exam: There is no evidence of ulcer or pre-ulcerative changes or infection. Orthopedic Exam: Muscle tone and strength are WNL. No limitations in general ROM. No crepitus or effusions noted. Foot type and digits show no abnormalities. Bony prominences are unremarkable. Skin: No Porokeratosis. No infection or ulcers  Diagnosis:  Onychomycosis, , Pain in right toe, pain in left toes  Treatment & Plan Procedures and Treatment: Consent by patient was obtained for treatment procedures. The patient understood the discussion of treatment and procedures well. All questions were answered thoroughly reviewed. Debridement of mycotic and hypertrophic toenails, 1 through 5 bilateral and clearing of subungual debris. No ulceration, no infection noted.  Return Visit-Office Procedure: Patient instructed to return to the office for a follow up visit 3 months for continued evaluation and treatment.    Gardiner Barefoot DPM

## 2015-10-02 ENCOUNTER — Other Ambulatory Visit: Payer: Self-pay | Admitting: Family Medicine

## 2015-10-04 NOTE — Telephone Encounter (Signed)
Rx refill sent to pharmacy. 

## 2015-10-06 ENCOUNTER — Encounter: Payer: Medicare Other | Attending: Physical Medicine & Rehabilitation | Admitting: Registered Nurse

## 2015-10-06 ENCOUNTER — Encounter: Payer: Self-pay | Admitting: Registered Nurse

## 2015-10-06 VITALS — BP 112/70 | HR 80

## 2015-10-06 DIAGNOSIS — M961 Postlaminectomy syndrome, not elsewhere classified: Secondary | ICD-10-CM | POA: Insufficient documentation

## 2015-10-06 DIAGNOSIS — H3530 Unspecified macular degeneration: Secondary | ICD-10-CM | POA: Diagnosis not present

## 2015-10-06 DIAGNOSIS — M25512 Pain in left shoulder: Secondary | ICD-10-CM | POA: Insufficient documentation

## 2015-10-06 DIAGNOSIS — K589 Irritable bowel syndrome without diarrhea: Secondary | ICD-10-CM | POA: Insufficient documentation

## 2015-10-06 DIAGNOSIS — M1712 Unilateral primary osteoarthritis, left knee: Secondary | ICD-10-CM | POA: Diagnosis not present

## 2015-10-06 DIAGNOSIS — I739 Peripheral vascular disease, unspecified: Secondary | ICD-10-CM | POA: Insufficient documentation

## 2015-10-06 DIAGNOSIS — R202 Paresthesia of skin: Secondary | ICD-10-CM | POA: Diagnosis not present

## 2015-10-06 DIAGNOSIS — G8929 Other chronic pain: Secondary | ICD-10-CM | POA: Diagnosis not present

## 2015-10-06 DIAGNOSIS — Z87891 Personal history of nicotine dependence: Secondary | ICD-10-CM | POA: Diagnosis not present

## 2015-10-06 DIAGNOSIS — R200 Anesthesia of skin: Secondary | ICD-10-CM | POA: Diagnosis not present

## 2015-10-06 DIAGNOSIS — K219 Gastro-esophageal reflux disease without esophagitis: Secondary | ICD-10-CM | POA: Diagnosis not present

## 2015-10-06 DIAGNOSIS — M19011 Primary osteoarthritis, right shoulder: Secondary | ICD-10-CM | POA: Diagnosis not present

## 2015-10-06 DIAGNOSIS — M7581 Other shoulder lesions, right shoulder: Secondary | ICD-10-CM | POA: Insufficient documentation

## 2015-10-06 DIAGNOSIS — K449 Diaphragmatic hernia without obstruction or gangrene: Secondary | ICD-10-CM | POA: Insufficient documentation

## 2015-10-06 DIAGNOSIS — M19042 Primary osteoarthritis, left hand: Secondary | ICD-10-CM | POA: Insufficient documentation

## 2015-10-06 DIAGNOSIS — I10 Essential (primary) hypertension: Secondary | ICD-10-CM | POA: Insufficient documentation

## 2015-10-06 DIAGNOSIS — M19041 Primary osteoarthritis, right hand: Secondary | ICD-10-CM | POA: Insufficient documentation

## 2015-10-06 DIAGNOSIS — M4726 Other spondylosis with radiculopathy, lumbar region: Secondary | ICD-10-CM | POA: Diagnosis not present

## 2015-10-06 DIAGNOSIS — R42 Dizziness and giddiness: Secondary | ICD-10-CM | POA: Insufficient documentation

## 2015-10-06 DIAGNOSIS — M5416 Radiculopathy, lumbar region: Secondary | ICD-10-CM

## 2015-10-06 DIAGNOSIS — E785 Hyperlipidemia, unspecified: Secondary | ICD-10-CM | POA: Insufficient documentation

## 2015-10-06 DIAGNOSIS — M5417 Radiculopathy, lumbosacral region: Secondary | ICD-10-CM | POA: Diagnosis not present

## 2015-10-06 DIAGNOSIS — Z5181 Encounter for therapeutic drug level monitoring: Secondary | ICD-10-CM | POA: Diagnosis not present

## 2015-10-06 DIAGNOSIS — Z79899 Other long term (current) drug therapy: Secondary | ICD-10-CM

## 2015-10-06 DIAGNOSIS — G894 Chronic pain syndrome: Secondary | ICD-10-CM | POA: Diagnosis not present

## 2015-10-06 DIAGNOSIS — M179 Osteoarthritis of knee, unspecified: Secondary | ICD-10-CM

## 2015-10-06 MED ORDER — GABAPENTIN 100 MG PO CAPS: 100.0000 mg | ORAL_CAPSULE | Freq: Three times a day (TID) | ORAL | Status: DC

## 2015-10-06 NOTE — Progress Notes (Signed)
Subjective:    Patient ID: Kelsey Sparks, female    DOB: 05-19-1945, 70 y.o.   MRN: [id number]  HPI: [name] is a 70 year old female who returns for evaluation of lower extremities with edema. She states her pain is located in her lower back mainly left side radiating into her left lower extremity. Also states the nerve pain in her left lower extremity has increased in intensity will increase her gabapentin she verbalizes understanding. She rates her pain 8. Her current exercise regime is walking.   Pain Inventory Average Pain 8 Pain Right Now 8 My pain is burning and stabbing  In the last 24 hours, has pain interfered with the following? General activity 6 Relation with others 0 Enjoyment of life 5 What TIME of day is your pain at its worst? evening and night Sleep (in general) Fair  Pain is worse with: walking and standing Pain improves with: rest, medication and injections Relief from Meds: 4  Mobility ability to climb steps?  yes do you drive?  yes transfers alone Do you have any goals in this area?  yes  Function retired Do you have any goals in this area?  yes  Neuro/Psych numbness tingling spasms  Prior Studies Any changes since last visit?  yes  Physicians involved in your care Any changes since last visit?  no   Family History  Problem Relation Age of Onset  . Arthritis Mother   . Asthma Mother   . Arthritis Father   . Hypertension Father   . Breast cancer Maternal Aunt   . Colon cancer Neg Hx    Social History   Social History  . Marital Status: Married    Spouse Name: N/A  . Number of Children: N/A  . Years of Education: N/A   Social History Main Topics  . Smoking status: Former Research scientist (life sciences)  . Smokeless tobacco: Never Used  . Alcohol Use: No  . Drug Use: No  . Sexual Activity: Not Asked   Other Topics Concern  . None   Social History Narrative   Past Surgical History  Procedure Laterality Date  . Total abdominal  hysterectomy w/ bilateral salpingoophorectomy    . Lumbar disc surgery    . Knee surgery    . Cholecystectomy    . Back surgery    . Neck surgery    . Colonoscopy    . Upper gastrointestinal endoscopy    . Polypectomy     Past Medical History  Diagnosis Date  . Allergy   . Hyperlipidemia   . Hypertension   . Low back pain   . Peripheral vascular disease ([place])   . Muscular degeneration   . Anemia   . Arthritis   . GERD (gastroesophageal reflux disease)   . Adenomatous colon polyp   . Esophageal stricture   . IBS (irritable bowel syndrome)   . Hiatal hernia   . Macular degeneration    BP 112/70 mmHg  Pulse 80  SpO2 95%  Opioid Risk Score:   Fall Risk Score:  `1  Depression screen PHQ 2/9  Depression screen [facility] 2/9 02/24/2015 10/27/2014 10/06/2013  Decreased Interest 2 0 0  Down, Depressed, Hopeless 0 0 -  PHQ - 2 Score 2 0 0  Altered sleeping 1 - -  Tired, decreased energy 2 - -  Change in appetite 0 - -  Feeling bad or failure about yourself  0 - -  Trouble concentrating 0 - -  Moving slowly or fidgety/restless 1 - -  Suicidal thoughts 0 - -  PHQ-9 Score 6 - -  Difficult doing work/chores Not difficult at all - -     Review of Systems  Constitutional: Negative.   HENT: Negative.   Eyes: Negative.   Respiratory: Negative.   Cardiovascular: Negative.   Endocrine: Negative.   Genitourinary: Negative.   Musculoskeletal: Positive for back pain.  Allergic/Immunologic: Negative.   Neurological: Positive for numbness.  Hematological: Negative.   Psychiatric/Behavioral: Negative.        Objective:   Physical Exam  Constitutional: She is oriented to person, place, and time. She appears well-developed and well-nourished.  HENT:  Head: Normocephalic and atraumatic.  Neck: Normal range of motion. Neck supple.  Cardiovascular: Normal rate and regular rhythm.   Pulmonary/Chest: Effort normal and breath sounds normal.  Musculoskeletal:  Normal Muscle Bulk and  Muscle Testing Reveals: Upper Extremities: Full ROM and Muscle Strength 5/5 Lumbar Paraspinal Tenderness: L-3- L-5 Mainly Left side Lower Extremities: Full ROM and Muscle Strength 5/5 Trace Edema Noted Arises from Table with ease Narrow Based gait   Neurological: She is alert and oriented to person, place, and time.  Skin: Skin is warm and dry.  Psychiatric: She has a normal mood and affect.  Nursing note and vitals reviewed.         Assessment & Plan:  1. Osteoarthritis left knee: Continue Mobic and Voltaren Gel 2. Right Lumbar Radiculitis: Gabapentin Increased  3. Pain Management: Continue Tramadol and HEP    20 minutes of face to face patient care time was spent during this visit. All questions were encouraged and answered.

## 2015-10-06 NOTE — Patient Instructions (Signed)
Increase Gabapentin to Two Capsules in the Morning and Afternoon and Bedtime.  When you see Dr. Naaman Plummer on 10/19/15  let him know if this has helped your nerve pain .

## 2015-10-08 ENCOUNTER — Other Ambulatory Visit: Payer: Self-pay | Admitting: Adult Health

## 2015-10-15 ENCOUNTER — Encounter: Payer: Self-pay | Admitting: Adult Health

## 2015-10-15 ENCOUNTER — Ambulatory Visit (INDEPENDENT_AMBULATORY_CARE_PROVIDER_SITE_OTHER): Payer: Medicare Other | Admitting: Adult Health

## 2015-10-15 VITALS — BP 118/68 | HR 88 | Temp 98.4°F | Ht 64.0 in | Wt 164.0 lb

## 2015-10-15 DIAGNOSIS — R600 Localized edema: Secondary | ICD-10-CM | POA: Diagnosis not present

## 2015-10-15 NOTE — Progress Notes (Signed)
Subjective:    Patient ID: Kelsey Sparks, female    DOB: [birth year], 70 y.o.   MRN: [id number]  HPI   70 year old female who presents to the office today for lower extremity swelling. She reports that she first noticed a few weeks ago. Her husband was placed in an assisted living facility a few months ago and she reports that she has been " snacking a lot". She has put on about 10 pounds since he was placed. She is snacking on foods such as salted nuts and popcorns.   She denies any SOB, CP, or pain in her legs.   Wt Readings from Last 3 Encounters:  10/15/15 164 lb (74.39 kg)  12/04/14 144 lb 14.4 oz (65.726 kg)  10/27/14 148 lb (67.132 kg)     Review of Systems  Constitutional: Positive for appetite change.  Respiratory: Negative.   Cardiovascular: Positive for leg swelling.  Musculoskeletal: Positive for arthralgias.  All other systems reviewed and are negative.  Past Medical History  Diagnosis Date  . Allergy   . Hyperlipidemia   . Hypertension   . Low back pain   . Peripheral vascular disease ([place])   . Muscular degeneration   . Anemia   . Arthritis   . GERD (gastroesophageal reflux disease)   . Adenomatous colon polyp   . Esophageal stricture   . IBS (irritable bowel syndrome)   . Hiatal hernia   . Macular degeneration     Social History   Social History  . Marital Status: Married    Spouse Name: N/A  . Number of Children: N/A  . Years of Education: N/A   Occupational History  . Not on file.   Social History Main Topics  . Smoking status: Former Research scientist (life sciences)  . Smokeless tobacco: Never Used  . Alcohol Use: No  . Drug Use: No  . Sexual Activity: Not on file   Other Topics Concern  . Not on file   Social History Narrative    Past Surgical History  Procedure Laterality Date  . Total abdominal hysterectomy w/ bilateral salpingoophorectomy    . Lumbar disc surgery    . Knee surgery    . Cholecystectomy    . Back surgery    . Neck surgery    .  Colonoscopy    . Upper gastrointestinal endoscopy    . Polypectomy      Family History  Problem Relation Age of Onset  . Arthritis Mother   . Asthma Mother   . Arthritis Father   . Hypertension Father   . Breast cancer Maternal Aunt   . Colon cancer Neg Hx     No Known Allergies  Current Outpatient Prescriptions on File Prior to Visit  Medication Sig Dispense Refill  . aspirin 81 MG tablet Take 81 mg by mouth daily.    Marland Kitchen atenolol (TENORMIN) 50 MG tablet take 1 tablet by mouth every morning 100 tablet 3  . baclofen (LIORESAL) 10 MG tablet Take 10 mg by mouth 2 (two) times daily.    . cetirizine (ZYRTEC) 10 MG tablet Take 10 mg by mouth daily.    . cyclobenzaprine (FLEXERIL) 10 MG tablet take 1 tablet by mouth at bedtime if needed 90 tablet 1  . diclofenac sodium (VOLTAREN) 1 % GEL Apply 2 g topically 3 (three) times daily. Three month supply 9 Tube 0  . fluticasone (FLONASE) 50 MCG/ACT nasal spray instill 2 sprays into each nostril once daily 48 g 3  .  gabapentin (NEURONTIN) 100 MG capsule Take 1-2 capsules (100-200 mg total) by mouth 3 (three) times daily. 180 capsule 3  . hyoscyamine (LEVSIN, ANASPAZ) 0.125 MG tablet place 1 tablet under the tongue every 6 hours if needed for cramping 360 tablet 3  . meclizine (ANTIVERT) 25 MG tablet take 1/2-1 tablet by mouth every 6 hours if needed 40 tablet 0  . meloxicam (MOBIC) 7.5 MG tablet take 1 tablet by mouth once daily 30 tablet 5  . Polyethyl Glycol-Propyl Glycol (SYSTANE) 0.4-0.3 % SOLN Apply to eye.    . RABEprazole (ACIPHEX) 20 MG tablet take 1 tablet by mouth once daily 90 tablet 1  . simvastatin (ZOCOR) 40 MG tablet Take 1 tablet (40 mg total) by mouth daily. 90 tablet 3  . traMADol (ULTRAM) 50 MG tablet Take 1-2 tablets (50-100 mg total) by mouth every 8 (eight) hours as needed. 30 tablet 2  . triamterene-hydrochlorothiazide (MAXZIDE-25) 37.5-25 MG tablet take 1 tablet by mouth every morning 90 tablet 0  . zonisamide (ZONEGRAN)  25 MG capsule Take 50 mg by mouth daily. Increase as directed  0  . [DISCONTINUED] ferrous sulfate 325 (65 FE) MG tablet Take 325 mg by mouth daily with breakfast.       No current facility-administered medications on file prior to visit.    BP 118/68 mmHg  Pulse 88  Temp(Src) 98.4 F (36.9 C) (Oral)  Ht 5\' 4"  (1.626 m)  Wt 164 lb (74.39 kg)  BMI 28.14 kg/m2  SpO2 97%        Objective:   Physical Exam  Constitutional: She is oriented to person, place, and time. She appears well-developed and well-nourished. No distress.  Cardiovascular: Normal rate, regular rhythm, normal heart sounds and intact distal pulses.  Exam reveals no gallop and no friction rub.   No murmur heard. Pulmonary/Chest: Effort normal and breath sounds normal. No respiratory distress. She has no wheezes. She has no rales. She exhibits no tenderness.  Musculoskeletal: Normal range of motion. She exhibits edema. She exhibits no tenderness.  Non pitting edema noted in bilateral legs.   Neurological: She is alert and oriented to person, place, and time.  Skin: Skin is warm and dry. No rash noted. She is not diaphoretic. No erythema. No pallor.  Psychiatric: She has a normal mood and affect. Her behavior is normal. Judgment and thought content normal.  Nursing note and vitals reviewed.     Assessment & Plan:  1. Bilateral edema of lower extremity - likely due to diet. Advised to cut back on sodium, increase water, start exercising and elevate legs at night - Follow up if no improvement  Dorothyann Peng, NP

## 2015-10-15 NOTE — Progress Notes (Signed)
Pre visit review using our clinic review tool, if applicable. No additional management support is needed unless otherwise documented below in the visit note. 

## 2015-10-19 ENCOUNTER — Encounter: Payer: Self-pay | Admitting: Physical Medicine & Rehabilitation

## 2015-10-19 ENCOUNTER — Encounter (HOSPITAL_BASED_OUTPATIENT_CLINIC_OR_DEPARTMENT_OTHER): Payer: Medicare Other | Admitting: Physical Medicine & Rehabilitation

## 2015-10-19 VITALS — BP 104/69 | HR 76

## 2015-10-19 DIAGNOSIS — M179 Osteoarthritis of knee, unspecified: Secondary | ICD-10-CM

## 2015-10-19 DIAGNOSIS — M5416 Radiculopathy, lumbar region: Secondary | ICD-10-CM | POA: Insufficient documentation

## 2015-10-19 DIAGNOSIS — M19011 Primary osteoarthritis, right shoulder: Secondary | ICD-10-CM | POA: Diagnosis not present

## 2015-10-19 DIAGNOSIS — M19242 Secondary osteoarthritis, left hand: Secondary | ICD-10-CM

## 2015-10-19 DIAGNOSIS — M7581 Other shoulder lesions, right shoulder: Secondary | ICD-10-CM | POA: Diagnosis not present

## 2015-10-19 DIAGNOSIS — M19042 Primary osteoarthritis, left hand: Secondary | ICD-10-CM | POA: Diagnosis not present

## 2015-10-19 DIAGNOSIS — M961 Postlaminectomy syndrome, not elsewhere classified: Secondary | ICD-10-CM | POA: Diagnosis not present

## 2015-10-19 DIAGNOSIS — M19041 Primary osteoarthritis, right hand: Secondary | ICD-10-CM

## 2015-10-19 DIAGNOSIS — M19241 Secondary osteoarthritis, right hand: Secondary | ICD-10-CM

## 2015-10-19 DIAGNOSIS — M7582 Other shoulder lesions, left shoulder: Secondary | ICD-10-CM

## 2015-10-19 DIAGNOSIS — M1712 Unilateral primary osteoarthritis, left knee: Secondary | ICD-10-CM

## 2015-10-19 DIAGNOSIS — M4716 Other spondylosis with myelopathy, lumbar region: Secondary | ICD-10-CM | POA: Diagnosis not present

## 2015-10-19 MED ORDER — GABAPENTIN 100 MG PO CAPS: 100.0000 mg | ORAL_CAPSULE | Freq: Three times a day (TID) | ORAL | 3 refills | Status: DC

## 2015-10-19 MED ORDER — TRAMADOL HCL 50 MG PO TABS: 50.0000 mg | ORAL_TABLET | Freq: Three times a day (TID) | ORAL | 2 refills | Status: DC | PRN

## 2015-10-19 NOTE — Progress Notes (Signed)
Subjective:    Patient ID: Kelsey Sparks, female    DOB: Dec 17, 1945, 70 y.o.   MRN: [id number]  HPI   Kelsey Sparks is here in follow up of her chronic pain. She has had ongoing pain in her hands, left knee and low back/leg. She has experienced increased swelling for about 2 weeks. She saw her family MD who advised decreased sodium intake. My NP also increased her gabapentin to 200mg  TID about two weeks for increasing left lower extremity radicular pain.   I reviewed her lumbar MRI from 2012 which revealed: L3-L4:  Grade 1 anterolisthesis.  Broad-based disc/pseudo disc protrusion.  Severe facet degeneration which has progressed since 2005.  Moderate ligament flavum hypertrophy.  Lateral compression on the thecal sac with overall mild spinal stenosis.  Mild bilateral lateral recess stenosis.  Mild left greater than right L3 foraminal stenosis.  L4-L5:  Chronic mild circumferential disc bulges stable.  Tiny chronic right lateral recess partial discectomy defect.  Partial laminectomy changes again noted on the right.  Moderate facet and left ligament flavum hypertrophy has mildly increased.  No spinal stenosis.  No significant lateral recess stenosis.  Mild L4 foraminal stenosis is not significantly changed.  L5-S1:  Stable anterior eccentric disc osteophyte complex.  Stable mild facet hypertrophy.  Capacious thecal sac at this level.  No spinal or lateral recess stenosis.  Mild left greater than right L5 foraminal stenosis is stable and mostly related to endplate Spurring.  She has had ESI's in the past which have been helpful for her low back and radicular sx  Pain Inventory Average Pain 8 Pain Right Now 6 My pain is sharp and aching  In the last 24 hours, has pain interfered with the following? General activity 7 Relation with others 0 Enjoyment of life 8 What TIME of day is your pain at its worst? all times Sleep (in general) Good  Pain is worse with: walking,  bending and some activites Pain improves with: rest, heat/ice, therapy/exercise, medication and injections Relief from Meds: 9  Mobility how many minutes can you walk? 5 ability to climb steps?  yes do you drive?  yes transfers alone Do you have any goals in this area?  yes  Function not employed: date last employed . Do you have any goals in this area?  yes  Neuro/Psych No problems in this area  Prior Studies Any changes since last visit?  no  Physicians involved in your care Any changes since last visit?  no   Family History  Problem Relation Age of Onset  . Arthritis Father   . Hypertension Father   . Arthritis Mother   . Asthma Mother   . Breast cancer Maternal Aunt   . Colon cancer Neg Hx    Social History   Social History  . Marital status: Married    Spouse name: N/A  . Number of children: N/A  . Years of education: N/A   Social History Main Topics  . Smoking status: Former Research scientist (life sciences)  . Smokeless tobacco: Never Used  . Alcohol use No  . Drug use: No  . Sexual activity: Not Asked   Other Topics Concern  . None   Social History Narrative  . None   Past Surgical History:  Procedure Laterality Date  . BACK SURGERY    . CHOLECYSTECTOMY    . COLONOSCOPY    . KNEE SURGERY    . LUMBAR DISC SURGERY    . NECK SURGERY    .  POLYPECTOMY    . TOTAL ABDOMINAL HYSTERECTOMY W/ BILATERAL SALPINGOOPHORECTOMY    . UPPER GASTROINTESTINAL ENDOSCOPY     Past Medical History:  Diagnosis Date  . Adenomatous colon polyp   . Allergy   . Anemia   . Arthritis   . Esophageal stricture   . GERD (gastroesophageal reflux disease)   . Hiatal hernia   . Hyperlipidemia   . Hypertension   . IBS (irritable bowel syndrome)   . Low back pain   . Macular degeneration   . Muscular degeneration   . Peripheral vascular disease ([place])    There were no vitals taken for this visit.  Opioid Risk Score:   Fall Risk Score:  `1  Depression screen PHQ 2/9  Depression screen  [facility] 2/9 02/24/2015 10/27/2014 10/06/2013  Decreased Interest 2 0 0  Down, Depressed, Hopeless 0 0 -  PHQ - 2 Score 2 0 0  Altered sleeping 1 - -  Tired, decreased energy 2 - -  Change in appetite 0 - -  Feeling bad or failure about yourself  0 - -  Trouble concentrating 0 - -  Moving slowly or fidgety/restless 1 - -  Suicidal thoughts 0 - -  PHQ-9 Score 6 - -  Difficult doing work/chores Not difficult at all - -    Review of Systems  Constitutional: Negative.   HENT: Negative.   Eyes: Negative.   Respiratory: Negative.   Cardiovascular: Negative.   Gastrointestinal: Negative.   Endocrine: Negative.   Genitourinary: Negative.   Musculoskeletal: Positive for back pain.  Skin: Negative.   Allergic/Immunologic: Negative.   Neurological: Negative.   Hematological: Negative.   Psychiatric/Behavioral: Negative.        Objective:   Physical Exam  General: Alert and oriented x 3, No apparent distress  HEENT: Head is normocephalic, atraumatic, PERRLA, EOMI, sclera anicteric, oral mucosa pink and moist, dentition intact, ext ear canals clear,  Neck: Supple without JVD or lymphadenopathy  Heart: Reg rate and rhythm. No murmurs rubs or gallops  Chest: CTA bilaterally without wheezes, rales, or rhonchi; no distress  Abdomen: Soft, non-tender, non-distended, bowel sounds positive.  Extremities: No clubbing, cyanosis, or edema. Pulses are 2+  Skin: Clean and intact without signs of breakdown  Neuro: Pt is cognitively appropriate with normal insight, memory, and awareness. Cranial nerves 2-12 are intact. Sensory exam is normal. Reflexes are 2+ in all 4's. Fine motor coordination is intact. No tremors. Motor function is grossly 5/5 in all 4's.  Musculoskeletal: Full ROM, No pain with AROM or PROM in the neck, trunk, or extremities. Posture appropriate. She has mild pain with palpation at all of her PIP's and DIP's of both hands--right hand more tender than the left. Continued mild sclerosis  at the joint but no major joint deformities. She struggle with finger flexion and finger opposition to a slight extent. Wrists were non-tender as were MCP's. Tinel's negative.  mild medial joint line tenderness along the left knee. Meniscal maneuvers were negative. No knee instability was seen. Mild crepitus was appreciated left knee still. No antalgia with gait. Left shoulder pain with IR/ER only mild.+SLR test LLE. Low back tender along L4-S1 levels, left greater than right---associated muscle spasm Psych: Pt's affect is appropriate. Pt is cooperative   Assessment & Plan:   1. Osteoarthritis of bilateral hands/fingers  2. Osteoarthritis left knee  3. DJD/mild rotator cuff tendonitis right shoulder--improved. Now with LUE symptoms predominantly .   4. 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 Will arrange a left L5-S1 transforaminal ESI per Dr. Letta Pate.  .  2. Voltaren gel and low dose meloxicam for joint pain  3. Continue supplements for joint pain including garlic, omega 3 fatty acids.  4. I refilled her tramadol 50mg  #30 today as well as gabapentin 100mg  #60  5. Reduce gabapentin back to 100mg  TID due to LE edema 6. Continue HEP for left shoulder.       I will see her back  after injections. 20 minutes of face to face patient care time were spent during this visit. All questions were encouraged and answered.

## 2015-10-19 NOTE — Patient Instructions (Signed)
PLEASE CALL ME WITH ANY PROBLEMS OR QUESTIONS ([phone])  

## 2015-10-20 ENCOUNTER — Encounter: Payer: Self-pay | Admitting: Adult Health

## 2015-10-20 ENCOUNTER — Telehealth: Payer: Self-pay | Admitting: Adult Health

## 2015-10-20 ENCOUNTER — Ambulatory Visit (INDEPENDENT_AMBULATORY_CARE_PROVIDER_SITE_OTHER): Payer: Medicare Other | Admitting: Adult Health

## 2015-10-20 VITALS — BP 118/64 | Temp 98.0°F | Wt 163.0 lb

## 2015-10-20 DIAGNOSIS — K644 Residual hemorrhoidal skin tags: Secondary | ICD-10-CM

## 2015-10-20 DIAGNOSIS — K648 Other hemorrhoids: Secondary | ICD-10-CM | POA: Diagnosis not present

## 2015-10-20 MED ORDER — HYDROCORTISONE ACETATE 25 MG RE SUPP: 25.0000 mg | Freq: Two times a day (BID) | RECTAL | 0 refills | Status: DC

## 2015-10-20 NOTE — Telephone Encounter (Signed)
[facility] this morning.

## 2015-10-20 NOTE — Progress Notes (Signed)
Subjective:    Patient ID: Kelsey Sparks, female    DOB: 1946/02/19, 70 y.o.   MRN: [id number]  HPI  70 year old female who presents to the office today for an acute issue. She believes that she has a rectal abscess. She reports that when she had a BM this morning she had" blood and pus" on the toilet paper. She has also had pain with wiping. This has been present for 24 hours.   Review of Systems  Constitutional: Negative.   Respiratory: Negative.   Cardiovascular: Negative.   Skin: Positive for wound.  All other systems reviewed and are negative.  Past Medical History:  Diagnosis Date  . Adenomatous colon polyp   . Allergy   . Anemia   . Arthritis   . Esophageal stricture   . GERD (gastroesophageal reflux disease)   . Hiatal hernia   . Hyperlipidemia   . Hypertension   . IBS (irritable bowel syndrome)   . Low back pain   . Macular degeneration   . Muscular degeneration   . Peripheral vascular disease [facility])     Social History   Social History  . Marital status: Married    Spouse name: N/A  . Number of children: N/A  . Years of education: N/A   Occupational History  . Not on file.   Social History Main Topics  . Smoking status: Former Research scientist (life sciences)  . Smokeless tobacco: Never Used  . Alcohol use No  . Drug use: No  . Sexual activity: Not on file   Other Topics Concern  . Not on file   Social History Narrative  . No narrative on file    Past Surgical History:  Procedure Laterality Date  . BACK SURGERY    . CHOLECYSTECTOMY    . COLONOSCOPY    . KNEE SURGERY    . LUMBAR DISC SURGERY    . NECK SURGERY    . POLYPECTOMY    . TOTAL ABDOMINAL HYSTERECTOMY W/ BILATERAL SALPINGOOPHORECTOMY    . UPPER GASTROINTESTINAL ENDOSCOPY      Family History  Problem Relation Age of Onset  . Arthritis Father   . Hypertension Father   . Arthritis Mother   . Asthma Mother   . Breast cancer Maternal Aunt   . Colon cancer Neg Hx     No Known Allergies  Current  Outpatient Prescriptions on File Prior to Visit  Medication Sig Dispense Refill  . aspirin 81 MG tablet Take 81 mg by mouth daily.    Marland Kitchen atenolol (TENORMIN) 50 MG tablet take 1 tablet by mouth every morning 100 tablet 3  . baclofen (LIORESAL) 10 MG tablet Take 10 mg by mouth 2 (two) times daily.    . cetirizine (ZYRTEC) 10 MG tablet Take 10 mg by mouth daily.    . cyclobenzaprine (FLEXERIL) 10 MG tablet take 1 tablet by mouth at bedtime if needed 90 tablet 1  . diclofenac sodium (VOLTAREN) 1 % GEL Apply 2 g topically 3 (three) times daily. Three month supply 9 Tube 0  . fluticasone (FLONASE) 50 MCG/ACT nasal spray instill 2 sprays into each nostril once daily 48 g 3  . gabapentin (NEURONTIN) 100 MG capsule Take 1 capsule (100 mg total) by mouth 3 (three) times daily. 90 capsule 3  . hyoscyamine (LEVSIN, ANASPAZ) 0.125 MG tablet place 1 tablet under the tongue every 6 hours if needed for cramping 360 tablet 3  . meclizine (ANTIVERT) 25 MG tablet take 1/2-1 tablet  by mouth every 6 hours if needed 40 tablet 0  . meloxicam (MOBIC) 7.5 MG tablet take 1 tablet by mouth once daily 30 tablet 5  . Polyethyl Glycol-Propyl Glycol (SYSTANE) 0.4-0.3 % SOLN Apply to eye.    . RABEprazole (ACIPHEX) 20 MG tablet take 1 tablet by mouth once daily 90 tablet 1  . simvastatin (ZOCOR) 40 MG tablet Take 1 tablet (40 mg total) by mouth daily. 90 tablet 3  . traMADol (ULTRAM) 50 MG tablet Take 1-2 tablets (50-100 mg total) by mouth every 8 (eight) hours as needed. 45 tablet 2  . triamterene-hydrochlorothiazide (MAXZIDE-25) 37.5-25 MG tablet take 1 tablet by mouth every morning 90 tablet 0  . zonisamide (ZONEGRAN) 25 MG capsule Take 50 mg by mouth daily. Increase as directed  0  . [DISCONTINUED] ferrous sulfate 325 (65 FE) MG tablet Take 325 mg by mouth daily with breakfast.       No current facility-administered medications on file prior to visit.     BP 118/64 (BP Location: Left Arm, Patient Position: Sitting)    Temp 98 F (36.7 C) (Oral)   Wt 163 lb (73.9 kg)   BMI 27.98 kg/m       Objective:   Physical Exam  Constitutional: She is oriented to person, place, and time. She appears well-developed and well-nourished. No distress.  Cardiovascular: Normal rate, regular rhythm, normal heart sounds and intact distal pulses.  Exam reveals no gallop and no friction rub.   No murmur heard. Pulmonary/Chest: Effort normal and breath sounds normal. No respiratory distress. She has no wheezes. She has no rales. She exhibits no tenderness.  Abdominal: Soft. Bowel sounds are normal. She exhibits no distension and no mass. There is no tenderness. There is no rebound and no guarding.  Genitourinary: Rectal exam shows external hemorrhoid and tenderness. Rectal exam shows no internal hemorrhoid and no mass.  Neurological: She is alert and oriented to person, place, and time.  Skin: Skin is warm and dry. No rash noted. She is not diaphoretic. No erythema. No pallor.  Psychiatric: She has a normal mood and affect. Her behavior is normal. Judgment and thought content normal.  Nursing note and vitals reviewed.      Assessment & Plan:  1. External hemorrhoid - No engorgement  - Advised Tucks pads.  - hydrocortisone (ANUSOL-HC) 25 MG suppository; Place 1 suppository (25 mg total) rectally 2 (two) times daily.  Dispense: 12 suppository; Refill: 0 - Follow up if no improvement - Consider referral to GI   Dorothyann Peng, NP

## 2015-10-20 NOTE — Telephone Encounter (Signed)
Patient Name: Kelsey Sparks DOB: Nov 09, 1945 Initial Comment Caller states she had a BM this morning. She thinks she may have a sore. Painful. Sore is bleeding on her bottom. Nurse Assessment Nurse: Ronnald Ramp, RN, Miranda Date/Time (Eastern Time): 10/20/2015 8:40:13 AM Confirm and document reason for call. If symptomatic, describe symptoms. You must click the next button to save text entered. ---Caller states she had a BM this morning and afterward it was painful around her rectum when she wiped. She looked with a mirror and can see a lump that is bleeding and drainage yellow fluids. Has the patient traveled out of the country within the last 30 days? ---Not Applicable Does the patient have any new or worsening symptoms? ---Yes Will a triage be completed? ---Yes Related visit to physician within the last 2 weeks? ---No Does the PT have any chronic conditions? (i.e. diabetes, asthma, etc.) ---Yes List chronic conditions. ---HTN, High Cholesterol, lower back pain Is this a behavioral health or substance abuse call? ---No Guidelines Guideline Title Affirmed Question Affirmed Notes Rectal Symptoms MODERATE-SEVERE rectal pain (i.e., interferes with school, work, or sleep) Final Disposition User See Physician within 24 Hours Jones, Therapist, sports, Miranda Comments Appt scheduled with Sallee Provencal, NP at 9:45am today Referrals REFERRED TO PCP OFFICE Disagree/Comply: Comply

## 2015-10-20 NOTE — Patient Instructions (Addendum)
It was great seeing you again.   You have an hemorrhoid that is causing your discomfort. You can use Tucks or any other hemorrhoid pads. I have also sent in a prescription for a hydrocortisone suppository.   Try not to strain or have hard bowel movement. Follow up if no improvement.     Hemorrhoids Hemorrhoids are swollen veins around the rectum or anus. There are two types of hemorrhoids:   Internal hemorrhoids. These occur in the veins just inside the rectum. They may poke through to the outside and become irritated and painful.  External hemorrhoids. These occur in the veins outside the anus and can be felt as a painful swelling or hard lump near the anus. CAUSES  Pregnancy.   Obesity.   Constipation or diarrhea.   Straining to have a bowel movement.   Sitting for long periods on the toilet.  Heavy lifting or other activity that caused you to strain.  Anal intercourse. SYMPTOMS   Pain.   Anal itching or irritation.   Rectal bleeding.   Fecal leakage.   Anal swelling.   One or more lumps around the anus.  DIAGNOSIS  Your caregiver may be able to diagnose hemorrhoids by visual examination. Other examinations or tests that may be performed include:   Examination of the rectal area with a gloved hand (digital rectal exam).   Examination of anal canal using a small tube (scope).   A blood test if you have lost a significant amount of blood.  A test to look inside the colon (sigmoidoscopy or colonoscopy). TREATMENT Most hemorrhoids can be treated at home. However, if symptoms do not seem to be getting better or if you have a lot of rectal bleeding, your caregiver may perform a procedure to help make the hemorrhoids get smaller or remove them completely. Possible treatments include:   Placing a rubber band at the base of the hemorrhoid to cut off the circulation (rubber band ligation).   Injecting a chemical to shrink the hemorrhoid (sclerotherapy).    Using a tool to burn the hemorrhoid (infrared light therapy).   Surgically removing the hemorrhoid (hemorrhoidectomy).   Stapling the hemorrhoid to block blood flow to the tissue (hemorrhoid stapling).  HOME CARE INSTRUCTIONS   Eat foods with fiber, such as whole grains, beans, nuts, fruits, and vegetables. Ask your doctor about taking products with added fiber in them (fibersupplements).  Increase fluid intake. Drink enough water and fluids to keep your urine clear or pale yellow.   Exercise regularly.   Go to the bathroom when you have the urge to have a bowel movement. Do not wait.   Avoid straining to have bowel movements.   Keep the anal area dry and clean. Use wet toilet paper or moist towelettes after a bowel movement.   Medicated creams and suppositories may be used or applied as directed.   Only take over-the-counter or prescription medicines as directed by your caregiver.   Take warm sitz baths for 15-20 minutes, 3-4 times a day to ease pain and discomfort.   Place ice packs on the hemorrhoids if they are tender and swollen. Using ice packs between sitz baths may be helpful.   Put ice in a plastic bag.   Place a towel between your skin and the bag.   Leave the ice on for 15-20 minutes, 3-4 times a day.   Do not use a donut-shaped pillow or sit on the toilet for long periods. This increases blood pooling and pain.  SEEK MEDICAL CARE IF:  You have increasing pain and swelling that is not controlled by treatment or medicine.  You have uncontrolled bleeding.  You have difficulty or you are unable to have a bowel movement.  You have pain or inflammation outside the area of the hemorrhoids. MAKE SURE YOU:  Understand these instructions.  Will watch your condition.  Will get help right away if you are not doing well or get worse.   This information is not intended to replace advice given to you by your health care provider. Make sure you  discuss any questions you have with your health care provider.   Document Released: 03/10/2000 Document Revised: 02/28/2012 Document Reviewed: 01/16/2012 Elsevier Interactive Patient Education [employer].

## 2015-10-25 ENCOUNTER — Other Ambulatory Visit: Payer: Self-pay | Admitting: Adult Health

## 2015-10-25 DIAGNOSIS — K644 Residual hemorrhoidal skin tags: Secondary | ICD-10-CM

## 2015-10-28 ENCOUNTER — Ambulatory Visit (INDEPENDENT_AMBULATORY_CARE_PROVIDER_SITE_OTHER): Payer: Medicare Other | Admitting: Adult Health

## 2015-10-28 ENCOUNTER — Encounter: Payer: Self-pay | Admitting: Adult Health

## 2015-10-28 VITALS — BP 130/72 | Temp 98.2°F | Ht 64.0 in | Wt 163.8 lb

## 2015-10-28 DIAGNOSIS — K649 Unspecified hemorrhoids: Secondary | ICD-10-CM | POA: Diagnosis not present

## 2015-10-28 DIAGNOSIS — I10 Essential (primary) hypertension: Secondary | ICD-10-CM | POA: Diagnosis not present

## 2015-10-28 MED ORDER — ATENOLOL 50 MG PO TABS: 50.0000 mg | ORAL_TABLET | Freq: Every morning | ORAL | 3 refills | Status: DC

## 2015-10-28 NOTE — Progress Notes (Signed)
Subjective:    Patient ID: Kelsey Sparks, female    DOB: 1945-05-24, 70 y.o.   MRN: [id number]  HPI  70 year old female who presents to the office today for follow up regarding hemorrhoids. She reports that since she has started using the suppositories and wipes, she is no longer having pain or itching and she feels as though the hemorrhoids have shrunk.   She denies any rectal bleeding or constipation   She also needs a refill of atenolol   She has no acute complaints.   Review of Systems  Gastrointestinal: Negative for anal bleeding, blood in stool, constipation, diarrhea, nausea, rectal pain and vomiting.  All other systems reviewed and are negative.  Past Medical History:  Diagnosis Date  . Adenomatous colon polyp   . Allergy   . Anemia   . Arthritis   . Esophageal stricture   . GERD (gastroesophageal reflux disease)   . Hiatal hernia   . Hyperlipidemia   . Hypertension   . IBS (irritable bowel syndrome)   . Low back pain   . Macular degeneration   . Muscular degeneration   . Peripheral vascular disease [facility])     Social History   Social History  . Marital status: Married    Spouse name: N/A  . Number of children: N/A  . Years of education: N/A   Occupational History  . Not on file.   Social History Main Topics  . Smoking status: Former Research scientist (life sciences)  . Smokeless tobacco: Never Used  . Alcohol use No  . Drug use: No  . Sexual activity: Not on file   Other Topics Concern  . Not on file   Social History Narrative  . No narrative on file    Past Surgical History:  Procedure Laterality Date  . BACK SURGERY    . CHOLECYSTECTOMY    . COLONOSCOPY    . KNEE SURGERY    . LUMBAR DISC SURGERY    . NECK SURGERY    . POLYPECTOMY    . TOTAL ABDOMINAL HYSTERECTOMY W/ BILATERAL SALPINGOOPHORECTOMY    . UPPER GASTROINTESTINAL ENDOSCOPY      Family History  Problem Relation Age of Onset  . Arthritis Father   . Hypertension Father   . Arthritis Mother   .  Asthma Mother   . Breast cancer Maternal Aunt   . Colon cancer Neg Hx     No Known Allergies  Current Outpatient Prescriptions on File Prior to Visit  Medication Sig Dispense Refill  . aspirin 81 MG tablet Take 81 mg by mouth daily.    . baclofen (LIORESAL) 10 MG tablet Take 10 mg by mouth 2 (two) times daily.    . cetirizine (ZYRTEC) 10 MG tablet Take 10 mg by mouth daily.    . cyclobenzaprine (FLEXERIL) 10 MG tablet take 1 tablet by mouth at bedtime if needed 90 tablet 1  . diclofenac sodium (VOLTAREN) 1 % GEL Apply 2 g topically 3 (three) times daily. Three month supply 9 Tube 0  . fluticasone (FLONASE) 50 MCG/ACT nasal spray instill 2 sprays into each nostril once daily 48 g 3  . gabapentin (NEURONTIN) 100 MG capsule Take 1 capsule (100 mg total) by mouth 3 (three) times daily. 90 capsule 3  . hydrocortisone (ANUSOL-HC) 25 MG suppository Place 1 suppository (25 mg total) rectally 2 (two) times daily. 12 suppository 0  . hyoscyamine (LEVSIN, ANASPAZ) 0.125 MG tablet place 1 tablet under the tongue every 6 hours  if needed for cramping 360 tablet 3  . meclizine (ANTIVERT) 25 MG tablet take 1/2-1 tablet by mouth every 6 hours if needed 40 tablet 0  . meloxicam (MOBIC) 7.5 MG tablet take 1 tablet by mouth once daily 30 tablet 5  . Polyethyl Glycol-Propyl Glycol (SYSTANE) 0.4-0.3 % SOLN Apply to eye.    . RABEprazole (ACIPHEX) 20 MG tablet take 1 tablet by mouth once daily 90 tablet 1  . simvastatin (ZOCOR) 40 MG tablet Take 1 tablet (40 mg total) by mouth daily. 90 tablet 3  . traMADol (ULTRAM) 50 MG tablet Take 1-2 tablets (50-100 mg total) by mouth every 8 (eight) hours as needed. 45 tablet 2  . triamterene-hydrochlorothiazide (MAXZIDE-25) 37.5-25 MG tablet take 1 tablet by mouth every morning 90 tablet 0  . zonisamide (ZONEGRAN) 25 MG capsule Take 50 mg by mouth daily. Increase as directed  0  . [DISCONTINUED] ferrous sulfate 325 (65 FE) MG tablet Take 325 mg by mouth daily with  breakfast.       No current facility-administered medications on file prior to visit.     BP 130/72   Temp 98.2 F (36.8 C) (Oral)   Ht 5\' 4"  (1.626 m)   Wt 163 lb 12.8 oz (74.3 kg)   BMI 28.12 kg/m       Objective:   Physical Exam  Constitutional: She appears well-developed and well-nourished. No distress.  Cardiovascular: Normal rate, regular rhythm, normal heart sounds and intact distal pulses.  Exam reveals no gallop.   No murmur heard. Pulmonary/Chest: Effort normal and breath sounds normal. No respiratory distress. She has no wheezes. She has no rales. She exhibits no tenderness.  Neurological: She is alert.  Skin: Skin is warm and dry. No rash noted. She is not diaphoretic. No erythema. No pallor.  Psychiatric: She has a normal mood and affect. Her behavior is normal. Judgment and thought content normal.  Nursing note and vitals reviewed.     Assessment & Plan:  1. Hemorrhoids, unspecified hemorrhoid type - Appears to have resolved.  - She can continue to use wipes and suppositories as needed - Follow up with any discomfort.   2. Essential hypertension - atenolol (TENORMIN) 50 MG tablet; Take 1 tablet (50 mg total) by mouth every morning.  Dispense: 100 tablet; Refill: 3   Dorothyann Peng, NP

## 2015-11-01 DIAGNOSIS — G43019 Migraine without aura, intractable, without status migrainosus: Secondary | ICD-10-CM | POA: Diagnosis not present

## 2015-11-02 DIAGNOSIS — Z23 Encounter for immunization: Secondary | ICD-10-CM | POA: Diagnosis not present

## 2015-11-19 ENCOUNTER — Encounter: Payer: Self-pay | Admitting: Physical Medicine & Rehabilitation

## 2015-11-19 ENCOUNTER — Ambulatory Visit (HOSPITAL_BASED_OUTPATIENT_CLINIC_OR_DEPARTMENT_OTHER): Payer: Medicare Other | Admitting: Physical Medicine & Rehabilitation

## 2015-11-19 ENCOUNTER — Telehealth: Payer: Self-pay | Admitting: Adult Health

## 2015-11-19 VITALS — BP 130/85 | HR 74

## 2015-11-19 DIAGNOSIS — M7581 Other shoulder lesions, right shoulder: Secondary | ICD-10-CM | POA: Insufficient documentation

## 2015-11-19 DIAGNOSIS — K219 Gastro-esophageal reflux disease without esophagitis: Secondary | ICD-10-CM | POA: Insufficient documentation

## 2015-11-19 DIAGNOSIS — H3530 Unspecified macular degeneration: Secondary | ICD-10-CM | POA: Insufficient documentation

## 2015-11-19 DIAGNOSIS — K449 Diaphragmatic hernia without obstruction or gangrene: Secondary | ICD-10-CM | POA: Diagnosis not present

## 2015-11-19 DIAGNOSIS — M4726 Other spondylosis with radiculopathy, lumbar region: Secondary | ICD-10-CM | POA: Insufficient documentation

## 2015-11-19 DIAGNOSIS — E785 Hyperlipidemia, unspecified: Secondary | ICD-10-CM | POA: Diagnosis not present

## 2015-11-19 DIAGNOSIS — G8929 Other chronic pain: Secondary | ICD-10-CM | POA: Diagnosis not present

## 2015-11-19 DIAGNOSIS — M5416 Radiculopathy, lumbar region: Secondary | ICD-10-CM | POA: Diagnosis not present

## 2015-11-19 DIAGNOSIS — K589 Irritable bowel syndrome without diarrhea: Secondary | ICD-10-CM | POA: Diagnosis not present

## 2015-11-19 DIAGNOSIS — Z87891 Personal history of nicotine dependence: Secondary | ICD-10-CM | POA: Insufficient documentation

## 2015-11-19 DIAGNOSIS — M25512 Pain in left shoulder: Secondary | ICD-10-CM | POA: Diagnosis not present

## 2015-11-19 DIAGNOSIS — M961 Postlaminectomy syndrome, not elsewhere classified: Secondary | ICD-10-CM | POA: Diagnosis not present

## 2015-11-19 DIAGNOSIS — I739 Peripheral vascular disease, unspecified: Secondary | ICD-10-CM | POA: Insufficient documentation

## 2015-11-19 DIAGNOSIS — M1712 Unilateral primary osteoarthritis, left knee: Secondary | ICD-10-CM | POA: Diagnosis not present

## 2015-11-19 DIAGNOSIS — I10 Essential (primary) hypertension: Secondary | ICD-10-CM | POA: Insufficient documentation

## 2015-11-19 DIAGNOSIS — R202 Paresthesia of skin: Secondary | ICD-10-CM | POA: Insufficient documentation

## 2015-11-19 DIAGNOSIS — R42 Dizziness and giddiness: Secondary | ICD-10-CM | POA: Diagnosis not present

## 2015-11-19 DIAGNOSIS — M19011 Primary osteoarthritis, right shoulder: Secondary | ICD-10-CM | POA: Diagnosis not present

## 2015-11-19 DIAGNOSIS — M19041 Primary osteoarthritis, right hand: Secondary | ICD-10-CM | POA: Diagnosis not present

## 2015-11-19 DIAGNOSIS — R200 Anesthesia of skin: Secondary | ICD-10-CM | POA: Insufficient documentation

## 2015-11-19 DIAGNOSIS — M19042 Primary osteoarthritis, left hand: Secondary | ICD-10-CM | POA: Diagnosis not present

## 2015-11-19 NOTE — Patient Instructions (Signed)

## 2015-11-19 NOTE — Progress Notes (Signed)
Left L5-S1 Lumbar transforaminal epidural steroid injection under fluoroscopic guidance  Indication: Lumbosacral radiculitis is not relieved by medication management or other conservative care and interfering with self-care and mobility.   Informed consent was obtained after describing risk and benefits of the procedure with the patient, this includes bleeding, bruising, infection, paralysis and medication side effects.  The patient wishes to proceed and has given written consent.  Patient was placed in prone position.  The lumbar area was marked and prepped with Betadine.  It was entered with a 25-gauge 1-1/2 inch needle and one mL of 1% lidocaine was injected into the skin and subcutaneous tissue.  Then a 22-gauge 3.5 in spinal needle was inserted into the Left L5-S1 intervertebral foramen under AP, lateral, and oblique view.  Then a solution containing one mL of 10 mg per mL dexamethasone and 2 mL of 1% lidocaine was injected.  The patient tolerated procedure well.  Post procedure instructions were given.  Please see post procedure form.

## 2015-11-19 NOTE — Telephone Encounter (Signed)
Pt is having feet swelling unable to come in today and would like to know if she can increase her maxide to see if that will help with the swelling.please advise

## 2015-11-19 NOTE — Progress Notes (Signed)
  [facility]   Name: Kelsey Sparks DOB:1945-10-24 MRN: [id number]  Date:11/19/2015  Physician: Alysia Penna, MD    Nurse/CMA: Wessling, CMA  Allergies: No Known Allergies  Consent Signed: Yes.    Is patient diabetic? No.  CBG today?   Pregnant: No. LMP: No LMP recorded. Patient has had a hysterectomy. (age 70-55)  Anticoagulants: no Anti-inflammatory: yes (81mg  aspirin) Antibiotics: no  Procedure: transforaminal epidural steroid injection  Position: Prone Start Time: 11:23am  End Time: 11:28am  Fluoro Time: 21  RN/CMA Kelsey Staebell, LPN Wessling, CMA    Time 10:45AM 11:30am    BP 130/85 131/80    Pulse 74 73    Respirations 12 12    O2 Sat 95 96    S/S 6 6    Pain Level 6/10 5/10     D/C home with friend, patient A & O X 3, D/C instructions reviewed, and sits independently.

## 2015-11-19 NOTE — Telephone Encounter (Signed)
Do not take two of the Maxide pills. Try elevating legs above heart first and cut back on sodium. I am afraid that if you take two of those pills that it will drop your blood pressure to low

## 2015-11-19 NOTE — Telephone Encounter (Signed)
Please advise 

## 2015-11-22 ENCOUNTER — Other Ambulatory Visit: Payer: Self-pay | Admitting: Adult Health

## 2015-11-22 DIAGNOSIS — E785 Hyperlipidemia, unspecified: Secondary | ICD-10-CM

## 2015-11-22 NOTE — Telephone Encounter (Signed)
Rx refill sent to pharmacy. 

## 2015-11-22 NOTE — Telephone Encounter (Signed)
I left a message for the pt to return my call. 

## 2015-11-24 ENCOUNTER — Ambulatory Visit
Admission: RE | Admit: 2015-11-24 | Discharge: 2015-11-24 | Disposition: A | Discharge status: Home / Self Care | Payer: Medicare Other | Source: Ambulatory Visit | Attending: Physical Medicine & Rehabilitation | Admitting: Physical Medicine & Rehabilitation

## 2015-11-24 ENCOUNTER — Encounter: Payer: Medicare Other | Attending: Physical Medicine & Rehabilitation | Admitting: Physical Medicine & Rehabilitation

## 2015-11-24 ENCOUNTER — Ambulatory Visit: Payer: Medicare Other | Admitting: Podiatry

## 2015-11-24 ENCOUNTER — Encounter: Payer: Self-pay | Admitting: Physical Medicine & Rehabilitation

## 2015-11-24 VITALS — BP 128/82 | HR 70 | Resp 14

## 2015-11-24 DIAGNOSIS — M19041 Primary osteoarthritis, right hand: Secondary | ICD-10-CM

## 2015-11-24 DIAGNOSIS — M1732 Unilateral post-traumatic osteoarthritis, left knee: Secondary | ICD-10-CM | POA: Diagnosis not present

## 2015-11-24 DIAGNOSIS — M25461 Effusion, right knee: Secondary | ICD-10-CM | POA: Diagnosis not present

## 2015-11-24 DIAGNOSIS — M1711 Unilateral primary osteoarthritis, right knee: Secondary | ICD-10-CM | POA: Insufficient documentation

## 2015-11-24 DIAGNOSIS — M19042 Primary osteoarthritis, left hand: Secondary | ICD-10-CM | POA: Diagnosis not present

## 2015-11-24 DIAGNOSIS — M961 Postlaminectomy syndrome, not elsewhere classified: Secondary | ICD-10-CM | POA: Diagnosis not present

## 2015-11-24 DIAGNOSIS — M5416 Radiculopathy, lumbar region: Secondary | ICD-10-CM

## 2015-11-24 DIAGNOSIS — M7581 Other shoulder lesions, right shoulder: Secondary | ICD-10-CM | POA: Diagnosis not present

## 2015-11-24 DIAGNOSIS — M1712 Unilateral primary osteoarthritis, left knee: Secondary | ICD-10-CM | POA: Diagnosis not present

## 2015-11-24 DIAGNOSIS — M19011 Primary osteoarthritis, right shoulder: Secondary | ICD-10-CM | POA: Diagnosis not present

## 2015-11-24 MED ORDER — MELOXICAM 15 MG PO TABS: 15.0000 mg | ORAL_TABLET | Freq: Every day | ORAL | 3 refills | Status: DC

## 2015-11-24 NOTE — Patient Instructions (Signed)
RIGHT KNEE--  ELEVATE, COMPRESS, APPLY ICE (30MINUTES).

## 2015-11-24 NOTE — Progress Notes (Signed)
Subjective:    Patient ID: Kelsey Sparks, female    DOB: [birth year], 70 y.o.   MRN: [id number]  HPI   Kelsey Sparks is here in follow up of her chronic pain. Last week she had a left L5-S1 transforaminal ESI by Dr. Letta Pate and had 80% relief of her low back pian. However, since Friday she developed pain in her RIGHT knee when she got up out of bed. It worsened with walking and has noticed some swelling in the right knee too. She has used voltaren gel and biofreez which help a little. She has also used heat but no ice. She had arthroscopic surgery on the right knee back in 1996, perhaps by Dr. Vanita Panda? She hasn't had any work up or assessment since that period.  Kelsey Sparks remains on mobic 7.5mg  daily and uses tramadol for more severe pain.      Pain Inventory Average Pain 9 Pain Right Now 9 My pain is sharp and aching  In the last 24 hours, has pain interfered with the following? General activity 7 Relation with others 5 Enjoyment of life 10 What TIME of day is your pain at its worst? night Sleep (in general) Fair  Pain is worse with: walking, bending, inactivity, standing and some activites Pain improves with: injections Relief from Meds: 4  Mobility do you drive?  yes  Function not employed: date last employed 4/13  Neuro/Psych spasms dizziness  Prior Studies Any changes since last visit?  no  Physicians involved in your care Any changes since last visit?  no   Family History  Problem Relation Age of Onset  . Arthritis Father   . Hypertension Father   . Arthritis Mother   . Asthma Mother   . Breast cancer Maternal Aunt   . Colon cancer Neg Hx    Social History   Social History  . Marital status: Married    Spouse name: N/A  . Number of children: N/A  . Years of education: N/A   Social History Main Topics  . Smoking status: Former Research scientist (life sciences)  . Smokeless tobacco: Never Used  . Alcohol use No  . Drug use: No  . Sexual activity: Not Asked   Other  Topics Concern  . None   Social History Narrative  . None   Past Surgical History:  Procedure Laterality Date  . BACK SURGERY    . CHOLECYSTECTOMY    . COLONOSCOPY    . KNEE SURGERY    . LUMBAR DISC SURGERY    . NECK SURGERY    . POLYPECTOMY    . TOTAL ABDOMINAL HYSTERECTOMY W/ BILATERAL SALPINGOOPHORECTOMY    . UPPER GASTROINTESTINAL ENDOSCOPY     Past Medical History:  Diagnosis Date  . Adenomatous colon polyp   . Allergy   . Anemia   . Arthritis   . Esophageal stricture   . GERD (gastroesophageal reflux disease)   . Hiatal hernia   . Hyperlipidemia   . Hypertension   . IBS (irritable bowel syndrome)   . Low back pain   . Macular degeneration   . Muscular degeneration   . Peripheral vascular disease (HCC)    BP 128/82 (BP Location: Left Arm, Patient Position: Sitting, Cuff Size: Normal)   Pulse 70   Resp 14   SpO2 97%   Opioid Risk Score:   Fall Risk Score:  `1  Depression screen PHQ 2/9  Depression screen [facility] 2/9 11/24/2015 02/24/2015 10/27/2014 10/06/2013  Decreased Interest 0 2 0 0  Down, Depressed, Hopeless 0 0 0 -  PHQ - 2 Score 0 2 0 0  Altered sleeping - 1 - -  Tired, decreased energy - 2 - -  Change in appetite - 0 - -  Feeling bad or failure about yourself  - 0 - -  Trouble concentrating - 0 - -  Moving slowly or fidgety/restless - 1 - -  Suicidal thoughts - 0 - -  PHQ-9 Score - 6 - -  Difficult doing work/chores - Not difficult at all - -    Review of Systems  Constitutional: Positive for diaphoresis and unexpected weight change.  Cardiovascular: Positive for leg swelling.  Gastrointestinal: Positive for abdominal pain, constipation and nausea.  All other systems reviewed and are negative.      Objective:   Physical Exam   General: Alert and oriented x 3, No apparent distress  HEENT: Head is normocephalic, atraumatic, PERRLA, EOMI, sclera anicteric, oral mucosa pink and moist, dentition intact, ext ear canals clear,  Neck: Supple  without JVD or lymphadenopathy  Heart: Reg rate and rhythm. No murmurs rubs or gallops  Chest: CTA bilaterally without wheezes, rales, or rhonchi; no distress  Abdomen: Soft, non-tender, non-distended, bowel sounds positive.  Extremities: No clubbing, cyanosis, or edema. Pulses are 2+  Skin: Clean and intact without signs of breakdown  Neuro: Pt is cognitively appropriate with normal insight, memory, and awareness. Cranial nerves 2-12 are intact. Sensory exam is normal. Reflexes are 2+ in all 4's. Fine motor coordination is intact. No tremors. Motor function is grossly 5/5 in all 4's.  Musculoskeletal: Full ROM, No pain with AROM or PROM in the neck, trunk, or extremities. Posture appropriate. She has mild pain with palpation at all of her PIP's and DIP's of both hands--right hand more tender than the left. Continued mild sclerosis at the joint but no major joint deformities. She struggle with finger flexion and finger opposition to a slight extent. Wrists were non-tender as were MCP's. Tinel's negative.  mild medial joint line tenderness along the left knee. Meniscal maneuvers were negative. No knee instability was seen. Mild crepitus was appreciated left knee still. right knee with peripatellar effusion, pain with flex/ext. Minimal crepitus. No instabilty. Antalgia with WB on right. Low back less tender.Psych: Pt's affect is appropriate. Pt is cooperative   Assessment & Plan:   1. Osteoarthritis of bilateral hands/fingers  2. Osteoarthritis left knee  3. DJD/mild rotator cuff tendonitis right shoulder--improved. Now with LUE symptoms predominantly .   4. 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 Reviewed maintenance for back. Needs to be sensible about activities. .  .  2. Voltaren gel and low dose meloxicam for joint pain  3. Continue supplements for joint pain including garlic, omega 3 fatty acids.  4. I refilled her tramadol  50mg  #30 today as well as gabapentin 100mg  #60  5. Reduce gabapentin back to 100mg  TID due to LE edema 6. Will increase mobic to 15mg  daily temporarily. Sent for xrays right knee.  -use ice/compression/elevation also.       I will see her back in about a month, sooner if needed for ?injeciton. 20 minutes of face to face patient care time were spent during this visit. All questions were encouraged and answered.

## 2015-11-24 NOTE — Telephone Encounter (Signed)
Third attempt to contact patient, unable to reach. Left message for patient to return phone call.

## 2015-11-26 ENCOUNTER — Telehealth: Payer: Self-pay | Admitting: *Deleted

## 2015-11-26 NOTE — Telephone Encounter (Signed)
Yes, just please let me know the morning of, so I can get there in time. thx

## 2015-11-26 NOTE — Telephone Encounter (Signed)
Patient was contacted and she will be here at 8:45 Tuesday morning  ((Sept 5th))

## 2015-11-26 NOTE — Telephone Encounter (Signed)
Kelsey Sparks has called complaining of her knee pain (she was just seen 11/24/15) and she says you told her she could call back and be worked in for a cortisone shot.  Her appt is 12/21/15 and only other availability is 9/18.  Can a injection be worked in for a 9:00 appt with you?

## 2015-11-26 NOTE — Telephone Encounter (Signed)
Dr Naaman Plummer notified of this appt by text.

## 2015-11-27 ENCOUNTER — Other Ambulatory Visit: Payer: Self-pay | Admitting: Adult Health

## 2015-11-27 MED ORDER — RABEPRAZOLE SODIUM 20 MG PO TBEC: 20.0000 mg | DELAYED_RELEASE_TABLET | Freq: Every day | ORAL | 3 refills | Status: DC

## 2015-11-30 ENCOUNTER — Encounter: Payer: Self-pay | Admitting: Physical Medicine & Rehabilitation

## 2015-11-30 ENCOUNTER — Encounter
Payer: Commercial Managed Care - HMO | Attending: Physical Medicine & Rehabilitation | Admitting: Physical Medicine & Rehabilitation

## 2015-11-30 VITALS — BP 121/76 | HR 68 | Resp 17

## 2015-11-30 DIAGNOSIS — M7581 Other shoulder lesions, right shoulder: Secondary | ICD-10-CM | POA: Diagnosis not present

## 2015-11-30 DIAGNOSIS — R200 Anesthesia of skin: Secondary | ICD-10-CM | POA: Insufficient documentation

## 2015-11-30 DIAGNOSIS — R42 Dizziness and giddiness: Secondary | ICD-10-CM | POA: Diagnosis not present

## 2015-11-30 DIAGNOSIS — R202 Paresthesia of skin: Secondary | ICD-10-CM | POA: Insufficient documentation

## 2015-11-30 DIAGNOSIS — K219 Gastro-esophageal reflux disease without esophagitis: Secondary | ICD-10-CM | POA: Diagnosis not present

## 2015-11-30 DIAGNOSIS — M19041 Primary osteoarthritis, right hand: Secondary | ICD-10-CM | POA: Insufficient documentation

## 2015-11-30 DIAGNOSIS — E785 Hyperlipidemia, unspecified: Secondary | ICD-10-CM | POA: Insufficient documentation

## 2015-11-30 DIAGNOSIS — K589 Irritable bowel syndrome without diarrhea: Secondary | ICD-10-CM | POA: Diagnosis not present

## 2015-11-30 DIAGNOSIS — M1712 Unilateral primary osteoarthritis, left knee: Secondary | ICD-10-CM | POA: Insufficient documentation

## 2015-11-30 DIAGNOSIS — M25512 Pain in left shoulder: Secondary | ICD-10-CM | POA: Diagnosis present

## 2015-11-30 DIAGNOSIS — M19011 Primary osteoarthritis, right shoulder: Secondary | ICD-10-CM | POA: Diagnosis not present

## 2015-11-30 DIAGNOSIS — Z87891 Personal history of nicotine dependence: Secondary | ICD-10-CM | POA: Insufficient documentation

## 2015-11-30 DIAGNOSIS — M4726 Other spondylosis with radiculopathy, lumbar region: Secondary | ICD-10-CM | POA: Insufficient documentation

## 2015-11-30 DIAGNOSIS — I739 Peripheral vascular disease, unspecified: Secondary | ICD-10-CM | POA: Diagnosis not present

## 2015-11-30 DIAGNOSIS — I10 Essential (primary) hypertension: Secondary | ICD-10-CM | POA: Insufficient documentation

## 2015-11-30 DIAGNOSIS — M1711 Unilateral primary osteoarthritis, right knee: Secondary | ICD-10-CM

## 2015-11-30 DIAGNOSIS — G8929 Other chronic pain: Secondary | ICD-10-CM | POA: Insufficient documentation

## 2015-11-30 DIAGNOSIS — K449 Diaphragmatic hernia without obstruction or gangrene: Secondary | ICD-10-CM | POA: Insufficient documentation

## 2015-11-30 DIAGNOSIS — M19042 Primary osteoarthritis, left hand: Secondary | ICD-10-CM | POA: Diagnosis not present

## 2015-11-30 DIAGNOSIS — M961 Postlaminectomy syndrome, not elsewhere classified: Secondary | ICD-10-CM | POA: Insufficient documentation

## 2015-11-30 DIAGNOSIS — H3530 Unspecified macular degeneration: Secondary | ICD-10-CM | POA: Insufficient documentation

## 2015-11-30 NOTE — Patient Instructions (Signed)
PLEASE CALL ME WITH ANY PROBLEMS OR QUESTIONS ([phone])  

## 2015-11-30 NOTE — Progress Notes (Signed)
Knee Injection OA right knee   Recent knee xray: There is a joint effusion. No fracture or dislocation. No appreciable joint space narrowing or erosion.  After informed consent and preparation of the skin with betadine and isopropyl alcohol, I injected 6mg  (1cc) of celestone and 4cc of 1% lidocaine into the right knee via antero-lateral approach. Additionally, aspiration was performed prior to injection. The patient tolerated well, and no complications were encountered. Afterward the area was cleaned and dressed. Post- injection instructions were provided.

## 2015-12-01 ENCOUNTER — Ambulatory Visit (INDEPENDENT_AMBULATORY_CARE_PROVIDER_SITE_OTHER): Payer: Medicare Other | Admitting: Podiatry

## 2015-12-01 ENCOUNTER — Encounter: Payer: Self-pay | Admitting: Podiatry

## 2015-12-01 DIAGNOSIS — M79676 Pain in unspecified toe(s): Secondary | ICD-10-CM | POA: Diagnosis not present

## 2015-12-01 DIAGNOSIS — B351 Tinea unguium: Secondary | ICD-10-CM

## 2015-12-01 NOTE — Progress Notes (Signed)
Patient ID: Kelsey Sparks, female   DOB: 02/21/1946, 70 y.o.   MRN: [id number] Complaint:  Visit Type: Patient returns to my office for continued preventative foot care services. Complaint: Patient states" my nails have grown long and thick and become painful to walk and wear shoes" . The patient presents for preventative foot care services. No changes to ROS  Podiatric Exam: Vascular: dorsalis pedis and posterior tibial pulses are palpable bilateral. Capillary return is immediate. Temperature gradient is WNL. Skin turgor WNL  Sensorium: Normal Semmes Weinstein monofilament test. Normal tactile sensation bilaterally. Nail Exam: Pt has thick disfigured discolored nails with subungual debris noted bilateral entire nail hallux through fifth toenails Ulcer Exam: There is no evidence of ulcer or pre-ulcerative changes or infection. Orthopedic Exam: Muscle tone and strength are WNL. No limitations in general ROM. No crepitus or effusions noted. Foot type and digits show no abnormalities. HAV  B/L. Skin: No Porokeratosis. No infection or ulcers  Diagnosis:  Onychomycosis, , Pain in right toe, pain in left toes  Treatment & Plan Procedures and Treatment: Consent by patient was obtained for treatment procedures. The patient understood the discussion of treatment and procedures well. All questions were answered thoroughly reviewed. Debridement of mycotic and hypertrophic toenails, 1 through 5 bilateral and clearing of subungual debris. No ulceration, no infection noted.  Return Visit-Office Procedure: Patient instructed to return to the office for a follow up visit 3 months for continued evaluation and treatment.    Sue Fernicola DPM 

## 2015-12-03 ENCOUNTER — Other Ambulatory Visit: Payer: Self-pay | Admitting: Family Medicine

## 2015-12-03 NOTE — Telephone Encounter (Signed)
Ok to refill 

## 2015-12-03 NOTE — Telephone Encounter (Signed)
Last filled 08/11/12  #40 Refills 0   Okay to refill?

## 2015-12-07 ENCOUNTER — Encounter: Payer: Self-pay | Admitting: Adult Health

## 2015-12-07 ENCOUNTER — Ambulatory Visit (INDEPENDENT_AMBULATORY_CARE_PROVIDER_SITE_OTHER): Payer: Medicare Other | Admitting: Adult Health

## 2015-12-07 VITALS — BP 136/80 | HR 66 | Temp 98.4°F | Resp 18 | Ht 64.0 in | Wt 164.8 lb

## 2015-12-07 DIAGNOSIS — I10 Essential (primary) hypertension: Secondary | ICD-10-CM

## 2015-12-07 DIAGNOSIS — E785 Hyperlipidemia, unspecified: Secondary | ICD-10-CM | POA: Diagnosis not present

## 2015-12-07 DIAGNOSIS — M1732 Unilateral post-traumatic osteoarthritis, left knee: Secondary | ICD-10-CM

## 2015-12-07 LAB — TSH: TSH: 0.67 u[IU]/mL (ref 0.35–4.50)

## 2015-12-07 LAB — HEPATIC FUNCTION PANEL
ALT: 11 U/L (ref 0–35)
AST: 17 U/L (ref 0–37)
Albumin: 4 g/dL (ref 3.5–5.2)
Alkaline Phosphatase: 75 U/L (ref 39–117)
BILIRUBIN DIRECT: 0.1 mg/dL (ref 0.0–0.3)
BILIRUBIN TOTAL: 0.6 mg/dL (ref 0.2–1.2)
TOTAL PROTEIN: 6.9 g/dL (ref 6.0–8.3)

## 2015-12-07 LAB — CBC WITH DIFFERENTIAL/PLATELET
BASOS PCT: 0.8 % (ref 0.0–3.0)
Basophils Absolute: 0 10*3/uL (ref 0.0–0.1)
EOS ABS: 0.4 10*3/uL (ref 0.0–0.7)
Eosinophils Relative: 5.9 % — ABNORMAL HIGH (ref 0.0–5.0)
HEMATOCRIT: 33.5 % — AB (ref 36.0–46.0)
Hemoglobin: 11.4 g/dL — ABNORMAL LOW (ref 12.0–15.0)
LYMPHS ABS: 1.3 10*3/uL (ref 0.7–4.0)
Lymphocytes Relative: 22.2 % (ref 12.0–46.0)
MCHC: 34.2 g/dL (ref 30.0–36.0)
MCV: 91.9 fl (ref 78.0–100.0)
MONO ABS: 0.5 10*3/uL (ref 0.1–1.0)
Monocytes Relative: 7.7 % (ref 3.0–12.0)
NEUTROS ABS: 3.8 10*3/uL (ref 1.4–7.7)
NEUTROS PCT: 63.4 % (ref 43.0–77.0)
PLATELETS: 173 10*3/uL (ref 150.0–400.0)
RBC: 3.64 Mil/uL — ABNORMAL LOW (ref 3.87–5.11)
RDW: 14.4 % (ref 11.5–15.5)
WBC: 6 10*3/uL (ref 4.0–10.5)

## 2015-12-07 LAB — POC URINALSYSI DIPSTICK (AUTOMATED)
BILIRUBIN UA: NEGATIVE
Blood, UA: NEGATIVE
Glucose, UA: NEGATIVE
KETONES UA: NEGATIVE
LEUKOCYTES UA: NEGATIVE
Nitrite, UA: NEGATIVE
Protein, UA: NEGATIVE
SPEC GRAV UA: 1.015
Urobilinogen, UA: 1
pH, UA: 7

## 2015-12-07 LAB — LDL CHOLESTEROL, DIRECT: Direct LDL: 109 mg/dL

## 2015-12-07 LAB — BASIC METABOLIC PANEL
BUN: 17 mg/dL (ref 6–23)
CHLORIDE: 107 meq/L (ref 96–112)
CO2: 28 meq/L (ref 19–32)
CREATININE: 1.36 mg/dL — AB (ref 0.40–1.20)
Calcium: 9 mg/dL (ref 8.4–10.5)
GFR: 49.7 mL/min — ABNORMAL LOW (ref >60.00)
Glucose, Bld: 99 mg/dL (ref 70–99)
Potassium: 3.9 mEq/L (ref 3.5–5.1)
Sodium: 139 mEq/L (ref 135–145)

## 2015-12-07 LAB — LIPID PANEL
Cholesterol: 194 mg/dL (ref 0–200)
HDL: 42 mg/dL (ref >39.00)
NONHDL: 152.13
TRIGLYCERIDES: 211 mg/dL — AB (ref 0.0–149.0)
Total CHOL/HDL Ratio: 5
VLDL: 42.2 mg/dL — ABNORMAL HIGH (ref 0.0–40.0)

## 2015-12-07 NOTE — Progress Notes (Signed)
Pre visit review using our clinic review tool, if applicable. No additional management support is needed unless otherwise documented below in the visit note. 

## 2015-12-07 NOTE — Progress Notes (Signed)
Subjective:    Patient ID: Kelsey Sparks, female    DOB: Oct 10, 1945, 70 y.o.   MRN: [id number]  HPI  Patient presents for yearly preventative exam due to history of hypertension, hyperlipidemia, PVD, and arthritis.   All immunizations and health maintenance protocols were reviewed with the patient and needed orders were placed.  Medication reconciliation,  past medical history, social history, problem list and allergies were reviewed in detail with the patient  Goals were established with regard to weight loss, exercise, and  diet in compliance with medications  End of life planning was discussed.  Her medications reviewed in detail there've been no changes. Some of her medications now are being written by Dr. Naaman Plummer at the pain clinic.   She takes Tenormin and Maxide daily for hypertension BP 110/70  She takes Zocor 40 mg daily for hyperlipidemia  She takes 20 mg of AcipHex daily for reflux  She takes Levsin when necessary for reflux and IBS   She was the Premarin vaginal cream once weekly for postmenopausal vaginal dryness  She gets routine eye care, dental care, BSE monthly, annual mammogram and colonoscopy every 10 years  Vaccinations up-to-date   Review of Systems  Constitutional: Negative.   HENT: Negative.   Eyes: Negative.   Respiratory: Negative.   Cardiovascular: Negative.   Endocrine: Negative.   Genitourinary: Negative.   Musculoskeletal: Positive for arthralgias and back pain. Negative for gait problem, joint swelling and myalgias.  Allergic/Immunologic: Negative.   Neurological: Negative.   Hematological: Negative.   Psychiatric/Behavioral: Negative.   All other systems reviewed and are negative.  Past Medical History:  Diagnosis Date  . Adenomatous colon polyp   . Allergy   . Anemia   . Arthritis   . Esophageal stricture   . GERD (gastroesophageal reflux disease)   . Hiatal hernia   . Hyperlipidemia   . Hypertension   . IBS  (irritable bowel syndrome)   . Low back pain   . Macular degeneration   . Muscular degeneration   . Peripheral vascular disease [facility]     Social History   Social History  . Marital status: Married    Spouse name: N/A  . Number of children: N/A  . Years of education: N/A   Occupational History  . Not on file.   Social History Main Topics  . Smoking status: Former Research scientist (life sciences)  . Smokeless tobacco: Never Used  . Alcohol use No  . Drug use: No  . Sexual activity: Not on file   Other Topics Concern  . Not on file   Social History Narrative  . No narrative on file    Past Surgical History:  Procedure Laterality Date  . BACK SURGERY    . CHOLECYSTECTOMY    . COLONOSCOPY    . KNEE SURGERY    . LUMBAR DISC SURGERY    . NECK SURGERY    . POLYPECTOMY    . TOTAL ABDOMINAL HYSTERECTOMY W/ BILATERAL SALPINGOOPHORECTOMY    . UPPER GASTROINTESTINAL ENDOSCOPY      Family History  Problem Relation Age of Onset  . Arthritis Father   . Hypertension Father   . Arthritis Mother   . Asthma Mother   . Breast cancer Maternal Aunt   . Colon cancer Neg Hx     No Known Allergies  Current Outpatient Prescriptions on File Prior to Visit  Medication Sig Dispense Refill  . aspirin 81 MG tablet Take 81 mg by mouth daily.    Marland Kitchen  atenolol (TENORMIN) 50 MG tablet Take 1 tablet (50 mg total) by mouth every morning. 100 tablet 3  . baclofen (LIORESAL) 10 MG tablet Take 10 mg by mouth 2 (two) times daily.    . cetirizine (ZYRTEC) 10 MG tablet Take 10 mg by mouth daily.    . cyclobenzaprine (FLEXERIL) 10 MG tablet take 1 tablet by mouth at bedtime if needed 90 tablet 1  . diclofenac sodium (VOLTAREN) 1 % GEL Apply 2 g topically 3 (three) times daily. Three month supply 9 Tube 0  . fluticasone (FLONASE) 50 MCG/ACT nasal spray instill 2 sprays into each nostril once daily 48 g 3  . gabapentin (NEURONTIN) 100 MG capsule Take 1 capsule (100 mg total) by mouth 3 (three) times daily. 90 capsule 3  .  hydrocortisone (ANUSOL-HC) 25 MG suppository Place 1 suppository (25 mg total) rectally 2 (two) times daily. 12 suppository 0  . hyoscyamine (LEVSIN, ANASPAZ) 0.125 MG tablet place 1 tablet under the tongue every 6 hours if needed for cramping 360 tablet 3  . meclizine (ANTIVERT) 25 MG tablet take 1/2-1 tablet by mouth every 6 hours if needed 40 tablet 0  . meloxicam (MOBIC) 15 MG tablet Take 1 tablet (15 mg total) by mouth daily. 30 tablet 3  . Polyethyl Glycol-Propyl Glycol (SYSTANE) 0.4-0.3 % SOLN Apply to eye.    . RABEprazole (ACIPHEX) 20 MG tablet Take 1 tablet (20 mg total) by mouth daily. 90 tablet 3  . simvastatin (ZOCOR) 40 MG tablet take 1 tablet by mouth once daily 90 tablet 0  . traMADol (ULTRAM) 50 MG tablet Take 1-2 tablets (50-100 mg total) by mouth every 8 (eight) hours as needed. 45 tablet 2  . triamterene-hydrochlorothiazide (MAXZIDE-25) 37.5-25 MG tablet take 1 tablet by mouth every morning 90 tablet 0  . zonisamide (ZONEGRAN) 25 MG capsule Take 50 mg by mouth daily. Increase as directed  0  . [DISCONTINUED] ferrous sulfate 325 (65 FE) MG tablet Take 325 mg by mouth daily with breakfast.       No current facility-administered medications on file prior to visit.     BP 136/80 (BP Location: Right Arm, Patient Position: Sitting, Cuff Size: Small)   Pulse 66   Temp 98.4 F (36.9 C) (Oral)   Resp 18   Ht 5\' 4"  (1.626 m)   Wt 164 lb 12.8 oz (74.8 kg)   SpO2 96%   BMI 28.29 kg/m       Objective:   Physical Exam  Constitutional: She is oriented to person, place, and time. She appears well-developed and well-nourished. No distress.  HENT:  Head: Normocephalic and atraumatic.  Right Ear: External ear normal.  Left Ear: External ear normal.  Nose: Nose normal.  Mouth/Throat: Oropharynx is clear and moist. No oropharyngeal exudate.  Eyes: Conjunctivae and EOM are normal. Pupils are equal, round, and reactive to light. Right eye exhibits no discharge. Left eye exhibits no  discharge. No scleral icterus.  Neck: Normal range of motion. Neck supple. No JVD present. No tracheal deviation present. No thyromegaly present.  Cardiovascular: Normal rate, regular rhythm, normal heart sounds and intact distal pulses.  Exam reveals no gallop and no friction rub.   No murmur heard. Pulmonary/Chest: Effort normal and breath sounds normal. No stridor. No respiratory distress. She has no wheezes. She has no rales. She exhibits no tenderness.  Abdominal: Soft. Bowel sounds are normal. She exhibits no distension and no mass. There is no tenderness. There is no rebound and no  guarding.  Genitourinary:  Genitourinary Comments: No masses, lumps, or dimpling discharge noted. Fibrous breasts  Musculoskeletal: Normal range of motion. She exhibits tenderness (left knee). She exhibits no edema or deformity.  Lymphadenopathy:    She has no cervical adenopathy.  Neurological: She is alert and oriented to person, place, and time. She has normal reflexes. She displays normal reflexes. No cranial nerve deficit. She exhibits normal muscle tone. Coordination normal.  Skin: Skin is warm and dry. No rash noted. She is not diaphoretic. No erythema. No pallor.  Psychiatric: She has a normal mood and affect. Her behavior is normal. Judgment and thought content normal.  Nursing note and vitals reviewed.     Assessment & Plan:  1. Essential hypertension - Near goal today  - No change in medication - Will keep monitoring  - POCT Urinalysis Dipstick (Automated) - Basic metabolic panel - CBC with Differential/Platelet - Hepatic function panel - Lipid panel - TSH - EKG 12-Lead - Sinus  Rhythm  -  Negative precordial T-waves., Rate 68  - Follow up in one year for next annual exam - Follow up sooner if needed  2. Hyperlipidemia - POCT Urinalysis Dipstick (Automated) - Basic metabolic panel - CBC with Differential/Platelet - Hepatic function panel - Lipid panel - TSH - EKG 12-Lead -  Consider increase of Zocor  3. Post-traumatic osteoarthritis of left knee - Continue with Dr. Wynona Dove, NP

## 2015-12-07 NOTE — Patient Instructions (Addendum)
It was so nice seeing you today.   I will follow up with you regarding your blood work  I would also like for you to sign up for an annual wellness visit on a Friday with our nurse Manuela Schwartz. This is a free benefit under medicare that may help [place] find additional ways to help you.   Please let me know if you need anything   Health Maintenance, Female Adopting a healthy lifestyle and getting preventive care can go a long way to promote health and wellness. Talk with your health care provider about what schedule of regular examinations is right for you. This is a good chance for you to check in with your provider about disease prevention and staying healthy. In between checkups, there are plenty of things you can do on your own. Experts have done a lot of research about which lifestyle changes and preventive measures are most likely to keep you healthy. Ask your health care provider for more information. WEIGHT AND DIET  Eat a healthy diet  Be sure to include plenty of vegetables, fruits, low-fat dairy products, and lean protein.  Do not eat a lot of foods high in solid fats, added sugars, or salt.  Get regular exercise. This is one of the most important things you can do for your health.  Most adults should exercise for at least 150 minutes each week. The exercise should increase your heart rate and make you sweat (moderate-intensity exercise).  Most adults should also do strengthening exercises at least twice a week. This is in addition to the moderate-intensity exercise.  Maintain a healthy weight  Body mass index (BMI) is a measurement that can be used to identify possible weight problems. It estimates body fat based on height and weight. Your health care provider can help determine your BMI and help you achieve or maintain a healthy weight.  For females 53 years of age and older:   A BMI below 18.5 is considered underweight.  A BMI of 18.5 to 24.9 is normal.  A BMI of 25 to 29.9 is  considered overweight.  A BMI of 30 and above is considered obese.  Watch levels of cholesterol and blood lipids  You should start having your blood tested for lipids and cholesterol at 68 years of age, then have this test every 5 years.  You may need to have your cholesterol levels checked more often if:  Your lipid or cholesterol levels are high.  You are older than 68 years of age.  You are at high risk for heart disease.  CANCER SCREENING   Lung Cancer  Lung cancer screening is recommended for adults 3-43 years old who are at high risk for lung cancer because of a history of smoking.  A yearly low-dose CT scan of the lungs is recommended for people who:  Currently smoke.  Have quit within the past 15 years.  Have at least a 30-pack-year history of smoking. A pack year is smoking an average of one pack of cigarettes a day for 1 year.  Yearly screening should continue until it has been 15 years since you quit.  Yearly screening should stop if you develop a health problem that would prevent you from having lung cancer treatment.  Breast Cancer  Practice breast self-awareness. This means understanding how your breasts normally appear and feel.  It also means doing regular breast self-exams. Let your health care provider know about any changes, no matter how small.  If you are in  your 20s or 30s, you should have a clinical breast exam (CBE) by a health care provider every 1-3 years as part of a regular health exam.  If you are 19 or older, have a CBE every year. Also consider having a breast X-ray (mammogram) every year.  If you have a family history of breast cancer, talk to your health care provider about genetic screening.  If you are at high risk for breast cancer, talk to your health care provider about having an MRI and a mammogram every year.  Breast cancer gene (BRCA) assessment is recommended for women who have family members with BRCA-related cancers.  BRCA-related cancers include:  Breast.  Ovarian.  Tubal.  Peritoneal cancers.  Results of the assessment will determine the need for genetic counseling and BRCA1 and BRCA2 testing. Cervical Cancer Your health care provider may recommend that you be screened regularly for cancer of the pelvic organs (ovaries, uterus, and vagina). This screening involves a pelvic examination, including checking for microscopic changes to the surface of your cervix (Pap test). You may be encouraged to have this screening done every 3 years, beginning at age 65.  For women ages 23-65, health care providers may recommend pelvic exams and Pap testing every 3 years, or they may recommend the Pap and pelvic exam, combined with testing for human papilloma virus (HPV), every 5 years. Some types of HPV increase your risk of cervical cancer. Testing for HPV may also be done on women of any age with unclear Pap test results.  Other health care providers may not recommend any screening for nonpregnant women who are considered low risk for pelvic cancer and who do not have symptoms. Ask your health care provider if a screening pelvic exam is right for you.  If you have had past treatment for cervical cancer or a condition that could lead to cancer, you need Pap tests and screening for cancer for at least 20 years after your treatment. If Pap tests have been discontinued, your risk factors (such as having a new sexual partner) need to be reassessed to determine if screening should resume. Some women have medical problems that increase the chance of getting cervical cancer. In these cases, your health care provider may recommend more frequent screening and Pap tests. Colorectal Cancer  This type of cancer can be detected and often prevented.  Routine colorectal cancer screening usually begins at 68 years of age and continues through 68 years of age.  Your health care provider may recommend screening at an earlier age if you  have risk factors for colon cancer.  Your health care provider may also recommend using home test kits to check for hidden blood in the stool.  A small camera at the end of a tube can be used to examine your colon directly (sigmoidoscopy or colonoscopy). This is done to check for the earliest forms of colorectal cancer.  Routine screening usually begins at age 36.  Direct examination of the colon should be repeated every 5-10 years through 68 years of age. However, you may need to be screened more often if early forms of precancerous polyps or small growths are found. Skin Cancer  Check your skin from head to toe regularly.  Tell your health care provider about any new moles or changes in moles, especially if there is a change in a mole's shape or color.  Also tell your health care provider if you have a mole that is larger than the size of a pencil  eraser.  Always use sunscreen. Apply sunscreen liberally and repeatedly throughout the day.  Protect yourself by wearing long sleeves, pants, a wide-brimmed hat, and sunglasses whenever you are outside. HEART DISEASE, DIABETES, AND HIGH BLOOD PRESSURE   High blood pressure causes heart disease and increases the risk of stroke. High blood pressure is more likely to develop in:  People who have blood pressure in the high end of the normal range (130-139/85-89 mm Hg).  People who are overweight or obese.  People who are African American.  If you are 27-29 years of age, have your blood pressure checked every 3-5 years. If you are 41 years of age or older, have your blood pressure checked every year. You should have your blood pressure measured twice--once when you are at a hospital or clinic, and once when you are not at a hospital or clinic. Record the average of the two measurements. To check your blood pressure when you are not at a hospital or clinic, you can use:  An automated blood pressure machine at a pharmacy.  A home blood pressure  monitor.  If you are between 79 years and 9 years old, ask your health care provider if you should take aspirin to prevent strokes.  Have regular diabetes screenings. This involves taking a blood sample to check your fasting blood sugar level.  If you are at a normal weight and have a low risk for diabetes, have this test once every three years after 68 years of age.  If you are overweight and have a high risk for diabetes, consider being tested at a younger age or more often. PREVENTING INFECTION  Hepatitis B  If you have a higher risk for hepatitis B, you should be screened for this virus. You are considered at high risk for hepatitis B if:  You were born in a country where hepatitis B is common. Ask your health care provider which countries are considered high risk.  Your parents were born in a high-risk country, and you have not been immunized against hepatitis B (hepatitis B vaccine).  You have HIV or AIDS.  You use needles to inject street drugs.  You live with someone who has hepatitis B.  You have had sex with someone who has hepatitis B.  You get hemodialysis treatment.  You take certain medicines for conditions, including cancer, organ transplantation, and autoimmune conditions. Hepatitis C  Blood testing is recommended for:  Everyone born from [birth year] through 1963.  Anyone with known risk factors for hepatitis C. Sexually transmitted infections (STIs)  You should be screened for sexually transmitted infections (STIs) including gonorrhea and chlamydia if:  You are sexually active and are younger than 68 years of age.  You are older than 68 years of age and your health care provider tells you that you are at risk for this type of infection.  Your sexual activity has changed since you were last screened and you are at an increased risk for chlamydia or gonorrhea. Ask your health care provider if you are at risk.  If you do not have HIV, but are at risk, it may be  recommended that you take a prescription medicine daily to prevent HIV infection. This is called pre-exposure prophylaxis (PrEP). You are considered at risk if:  You are sexually active and do not regularly use condoms or know the HIV status of your partner(s).  You take drugs by injection.  You are sexually active with a partner who has HIV. Talk with  your health care provider about whether you are at high risk of being infected with HIV. If you choose to begin PrEP, you should first be tested for HIV. You should then be tested every 3 months for as long as you are taking PrEP.  PREGNANCY   If you are premenopausal and you may become pregnant, ask your health care provider about preconception counseling.  If you may become pregnant, take 400 to 800 micrograms (mcg) of folic acid every day.  If you want to prevent pregnancy, talk to your health care provider about birth control (contraception). OSTEOPOROSIS AND MENOPAUSE   Osteoporosis is a disease in which the bones lose minerals and strength with aging. This can result in serious bone fractures. Your risk for osteoporosis can be identified using a bone density scan.  If you are 3 years of age or older, or if you are at risk for osteoporosis and fractures, ask your health care provider if you should be screened.  Ask your health care provider whether you should take a calcium or vitamin D supplement to lower your risk for osteoporosis.  Menopause may have certain physical symptoms and risks.  Hormone replacement therapy may reduce some of these symptoms and risks. Talk to your health care provider about whether hormone replacement therapy is right for you.  HOME CARE INSTRUCTIONS   Schedule regular health, dental, and eye exams.  Stay current with your immunizations.   Do not use any tobacco products including cigarettes, chewing tobacco, or electronic cigarettes.  If you are pregnant, do not drink alcohol.  If you are  breastfeeding, limit how much and how often you drink alcohol.  Limit alcohol intake to no more than 1 drink per day for nonpregnant women. One drink equals 12 ounces of beer, 5 ounces of wine, or 1 ounces of hard liquor.  Do not use street drugs.  Do not share needles.  Ask your health care provider for help if you need support or information about quitting drugs.  Tell your health care provider if you often feel depressed.  Tell your health care provider if you have ever been abused or do not feel safe at home.   This information is not intended to replace advice given to you by your health care provider. Make sure you discuss any questions you have with your health care provider.   Document Released: 09/26/2010 Document Revised: 04/03/2014 Document Reviewed: 02/12/2013 Elsevier Interactive Patient Education [employer].

## 2015-12-12 ENCOUNTER — Other Ambulatory Visit: Payer: Self-pay | Admitting: Adult Health

## 2015-12-14 NOTE — Telephone Encounter (Signed)
Ok to refill 

## 2015-12-15 ENCOUNTER — Telehealth: Payer: Self-pay | Admitting: Adult Health

## 2015-12-15 ENCOUNTER — Encounter: Payer: Self-pay | Admitting: Adult Health

## 2015-12-15 ENCOUNTER — Telehealth: Payer: Self-pay

## 2015-12-15 NOTE — Telephone Encounter (Signed)
Received PA request for Hyoscyamine 0.125mg  ODT from [facility]. Called and spoke with [place] [employer], the quantity of the medication needed to be changed to 120 tabs, and the day supply to 30. Pharmacy aware.

## 2015-12-15 NOTE — Telephone Encounter (Signed)
° °  Pt said she now uses [facility] order  For her prescriptions

## 2015-12-21 ENCOUNTER — Ambulatory Visit: Payer: Medicare Other | Admitting: Physical Medicine & Rehabilitation

## 2015-12-28 ENCOUNTER — Other Ambulatory Visit: Payer: Self-pay | Admitting: Adult Health

## 2015-12-28 NOTE — Telephone Encounter (Signed)
Ok to refill for one year  

## 2015-12-29 ENCOUNTER — Ambulatory Visit: Payer: Medicare Other | Admitting: Physical Medicine & Rehabilitation

## 2016-01-04 ENCOUNTER — Ambulatory Visit: Payer: Medicare Other | Admitting: Physical Medicine & Rehabilitation

## 2016-01-09 ENCOUNTER — Other Ambulatory Visit: Payer: Self-pay | Admitting: Physical Medicine & Rehabilitation

## 2016-01-09 DIAGNOSIS — M19242 Secondary osteoarthritis, left hand: Principal | ICD-10-CM

## 2016-01-09 DIAGNOSIS — M4716 Other spondylosis with myelopathy, lumbar region: Secondary | ICD-10-CM

## 2016-01-09 DIAGNOSIS — M7582 Other shoulder lesions, left shoulder: Secondary | ICD-10-CM

## 2016-01-09 DIAGNOSIS — M5416 Radiculopathy, lumbar region: Secondary | ICD-10-CM

## 2016-01-09 DIAGNOSIS — M19241 Secondary osteoarthritis, right hand: Secondary | ICD-10-CM

## 2016-01-09 DIAGNOSIS — M1712 Unilateral primary osteoarthritis, left knee: Secondary | ICD-10-CM

## 2016-02-01 ENCOUNTER — Encounter: Payer: Commercial Managed Care - HMO | Attending: Physical Medicine & Rehabilitation | Admitting: Registered Nurse

## 2016-02-01 ENCOUNTER — Encounter: Payer: Self-pay | Admitting: Registered Nurse

## 2016-02-01 ENCOUNTER — Encounter (INDEPENDENT_AMBULATORY_CARE_PROVIDER_SITE_OTHER): Payer: Self-pay

## 2016-02-01 VITALS — BP 105/68 | HR 72 | Resp 14

## 2016-02-01 DIAGNOSIS — M1711 Unilateral primary osteoarthritis, right knee: Secondary | ICD-10-CM | POA: Diagnosis not present

## 2016-02-01 DIAGNOSIS — M25512 Pain in left shoulder: Secondary | ICD-10-CM | POA: Insufficient documentation

## 2016-02-01 DIAGNOSIS — Z87891 Personal history of nicotine dependence: Secondary | ICD-10-CM | POA: Diagnosis not present

## 2016-02-01 DIAGNOSIS — R42 Dizziness and giddiness: Secondary | ICD-10-CM | POA: Diagnosis not present

## 2016-02-01 DIAGNOSIS — R200 Anesthesia of skin: Secondary | ICD-10-CM | POA: Diagnosis not present

## 2016-02-01 DIAGNOSIS — M7581 Other shoulder lesions, right shoulder: Secondary | ICD-10-CM | POA: Insufficient documentation

## 2016-02-01 DIAGNOSIS — M19011 Primary osteoarthritis, right shoulder: Secondary | ICD-10-CM | POA: Insufficient documentation

## 2016-02-01 DIAGNOSIS — K589 Irritable bowel syndrome without diarrhea: Secondary | ICD-10-CM | POA: Diagnosis not present

## 2016-02-01 DIAGNOSIS — M4716 Other spondylosis with myelopathy, lumbar region: Secondary | ICD-10-CM | POA: Diagnosis not present

## 2016-02-01 DIAGNOSIS — K219 Gastro-esophageal reflux disease without esophagitis: Secondary | ICD-10-CM | POA: Insufficient documentation

## 2016-02-01 DIAGNOSIS — M19042 Primary osteoarthritis, left hand: Secondary | ICD-10-CM | POA: Insufficient documentation

## 2016-02-01 DIAGNOSIS — G894 Chronic pain syndrome: Secondary | ICD-10-CM | POA: Diagnosis not present

## 2016-02-01 DIAGNOSIS — E785 Hyperlipidemia, unspecified: Secondary | ICD-10-CM | POA: Insufficient documentation

## 2016-02-01 DIAGNOSIS — G8929 Other chronic pain: Secondary | ICD-10-CM | POA: Diagnosis present

## 2016-02-01 DIAGNOSIS — R202 Paresthesia of skin: Secondary | ICD-10-CM | POA: Insufficient documentation

## 2016-02-01 DIAGNOSIS — M961 Postlaminectomy syndrome, not elsewhere classified: Secondary | ICD-10-CM | POA: Insufficient documentation

## 2016-02-01 DIAGNOSIS — M4726 Other spondylosis with radiculopathy, lumbar region: Secondary | ICD-10-CM | POA: Insufficient documentation

## 2016-02-01 DIAGNOSIS — M1712 Unilateral primary osteoarthritis, left knee: Secondary | ICD-10-CM | POA: Insufficient documentation

## 2016-02-01 DIAGNOSIS — K449 Diaphragmatic hernia without obstruction or gangrene: Secondary | ICD-10-CM | POA: Insufficient documentation

## 2016-02-01 DIAGNOSIS — I10 Essential (primary) hypertension: Secondary | ICD-10-CM | POA: Diagnosis not present

## 2016-02-01 DIAGNOSIS — I739 Peripheral vascular disease, unspecified: Secondary | ICD-10-CM | POA: Diagnosis not present

## 2016-02-01 DIAGNOSIS — M1732 Unilateral post-traumatic osteoarthritis, left knee: Secondary | ICD-10-CM

## 2016-02-01 DIAGNOSIS — H3530 Unspecified macular degeneration: Secondary | ICD-10-CM | POA: Diagnosis not present

## 2016-02-01 DIAGNOSIS — M19041 Primary osteoarthritis, right hand: Secondary | ICD-10-CM | POA: Diagnosis not present

## 2016-02-01 NOTE — Progress Notes (Signed)
Subjective:    Patient ID: Kelsey Sparks, female    DOB: [birth year], 70 y.o.   MRN: [id number]  HPI: [name] is a 70 year old female who returns for chronic pain and medication refill. She states her pain is located in her bilateral knees right greater than left.  She rates her pain 5. Her current exercise regime is walking, she will be starting water aerobic at [employer] and [employer].   Pain Inventory Average Pain 7 Pain Right Now 5 My pain is sharp, stabbing and aching  In the last 24 hours, has pain interfered with the following? General activity 10 Relation with others 0 Enjoyment of life 10 What TIME of day is your pain at its worst? morning, evening, night Sleep (in general) Fair  Pain is worse with: walking, bending, inactivity, standing and some activites Pain improves with: heat/ice, therapy/exercise, medication and injections Relief from Meds: 8  Mobility how many minutes can you walk? 5 ability to climb steps?  yes do you drive?  yes transfers alone Do you have any goals in this area?  yes  Function not employed: date last employed 07/22/2011 retired Do you have any goals in this area?  yes  Neuro/Psych numbness spasms dizziness  Prior Studies Any changes since last visit?  no  Physicians involved in your care Any changes since last visit?  no   Family History  Problem Relation Age of Onset  . Arthritis Father   . Hypertension Father   . Arthritis Mother   . Asthma Mother   . Breast cancer Maternal Aunt   . Colon cancer Neg Hx    Social History   Social History  . Marital status: Married    Spouse name: N/A  . Number of children: N/A  . Years of education: N/A   Social History Main Topics  . Smoking status: Former Research scientist (life sciences)  . Smokeless tobacco: Never Used  . Alcohol use No  . Drug use: No  . Sexual activity: Not Asked   Other Topics Concern  . None   Social History Narrative  . None   Past Surgical History:    Procedure Laterality Date  . BACK SURGERY    . CHOLECYSTECTOMY    . COLONOSCOPY    . KNEE SURGERY    . LUMBAR DISC SURGERY    . NECK SURGERY    . POLYPECTOMY    . TOTAL ABDOMINAL HYSTERECTOMY W/ BILATERAL SALPINGOOPHORECTOMY    . UPPER GASTROINTESTINAL ENDOSCOPY     Past Medical History:  Diagnosis Date  . Adenomatous colon polyp   . Allergy   . Anemia   . Arthritis   . Esophageal stricture   . GERD (gastroesophageal reflux disease)   . Hiatal hernia   . Hyperlipidemia   . Hypertension   . IBS (irritable bowel syndrome)   . Low back pain   . Macular degeneration   . Muscular degeneration   . Peripheral vascular disease (HCC)    BP 105/68   Pulse 72   Resp 14   SpO2 98%   Opioid Risk Score:   Fall Risk Score:  `1  Depression screen PHQ 2/9  Depression screen [facility] 2/9 11/24/2015 02/24/2015 10/27/2014 10/06/2013  Decreased Interest 0 2 0 0  Down, Depressed, Hopeless 0 0 0 -  PHQ - 2 Score 0 2 0 0  Altered sleeping - 1 - -  Tired, decreased energy - 2 - -  Change in appetite -  0 - -  Feeling bad or failure about yourself  - 0 - -  Trouble concentrating - 0 - -  Moving slowly or fidgety/restless - 1 - -  Suicidal thoughts - 0 - -  PHQ-9 Score - 6 - -  Difficult doing work/chores - Not difficult at all - -    Review of Systems  Constitutional: Positive for unexpected weight change.       Night sweats  Respiratory: Positive for shortness of breath.   Gastrointestinal: Positive for abdominal pain, constipation and nausea.  Endocrine:       High blood sugar  Musculoskeletal:       Limb swelling  All other systems reviewed and are negative.      Objective:   Physical Exam  Constitutional: She is oriented to person, place, and time. She appears well-developed and well-nourished.  HENT:  Head: Normocephalic and atraumatic.  Neck: Normal range of motion. Neck supple.  Cardiovascular: Normal rate and regular rhythm.   Pulmonary/Chest: Effort normal and breath  sounds normal.  Musculoskeletal:  Normal Muscle Bulk and Muscle Testing Reveals: Upper Extremities: Full ROM and Muscle Strength 5/5 Left AC Joint Tenderness Lower Extremities: Full ROM and Muscle Strength 5/5 Bilateral Lower Extremities Flexion Produces Pain into  Bilateral Patella's Arises from Table with ease Narrow Based Gait   Neurological: She is alert and oriented to person, place, and time.  Skin: Skin is warm and dry.  Psychiatric: She has a normal mood and affect.  Nursing note and vitals reviewed.         Assessment & Plan:  1. 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 Schedule for Right Knee Cortisone Injection with Dr. Naaman Plummer : Continue Mobic and Voltaren Gel 2. Right Lumbar Radiculitis: Continue Gabapentin  3. Pain Management: Continue Tramadol and HEP    20 minutes of face to face patient care time was spent during this visit. All questions were encouraged and answered.   F/U in 1 month

## 2016-02-02 ENCOUNTER — Ambulatory Visit: Payer: Medicare Other | Admitting: Physical Medicine & Rehabilitation

## 2016-02-17 ENCOUNTER — Other Ambulatory Visit: Payer: Self-pay | Admitting: Family Medicine

## 2016-02-17 DIAGNOSIS — E785 Hyperlipidemia, unspecified: Secondary | ICD-10-CM

## 2016-02-23 ENCOUNTER — Ambulatory Visit (INDEPENDENT_AMBULATORY_CARE_PROVIDER_SITE_OTHER): Payer: Medicare HMO | Admitting: Podiatry

## 2016-02-23 ENCOUNTER — Encounter: Payer: Self-pay | Admitting: Podiatry

## 2016-02-23 VITALS — Ht 64.0 in | Wt 164.0 lb

## 2016-02-23 DIAGNOSIS — M79676 Pain in unspecified toe(s): Secondary | ICD-10-CM | POA: Diagnosis not present

## 2016-02-23 DIAGNOSIS — B351 Tinea unguium: Secondary | ICD-10-CM | POA: Diagnosis not present

## 2016-02-23 NOTE — Progress Notes (Signed)
Patient ID: Kelsey Sparks, female   DOB: 01/10/1946, 70 y.o.   MRN: [id number] Complaint:  Visit Type: Patient returns to my office for continued preventative foot care services. Complaint: Patient states" my nails have grown long and thick and become painful to walk and wear shoes" . The patient presents for preventative foot care services. No changes to ROS  Podiatric Exam: Vascular: dorsalis pedis and posterior tibial pulses are palpable bilateral. Capillary return is immediate. Temperature gradient is WNL. Skin turgor WNL  Sensorium: Normal Semmes Weinstein monofilament test. Normal tactile sensation bilaterally. Nail Exam: Pt has thick disfigured discolored nails with subungual debris noted bilateral entire nail hallux through fifth toenails Ulcer Exam: There is no evidence of ulcer or pre-ulcerative changes or infection. Orthopedic Exam: Muscle tone and strength are WNL. No limitations in general ROM. No crepitus or effusions noted. Foot type and digits show no abnormalities. HAV  B/L. Skin: No Porokeratosis. No infection or ulcers  Diagnosis:  Onychomycosis, , Pain in right toe, pain in left toes  Treatment & Plan Procedures and Treatment: Consent by patient was obtained for treatment procedures. The patient understood the discussion of treatment and procedures well. All questions were answered thoroughly reviewed. Debridement of mycotic and hypertrophic toenails, 1 through 5 bilateral and clearing of subungual debris. No ulceration, no infection noted.  Return Visit-Office Procedure: Patient instructed to return to the office for a follow up visit 3 months for continued evaluation and treatment.    Venessa Wickham DPM 

## 2016-02-28 ENCOUNTER — Other Ambulatory Visit: Payer: Self-pay | Admitting: *Deleted

## 2016-02-28 DIAGNOSIS — M1711 Unilateral primary osteoarthritis, right knee: Secondary | ICD-10-CM

## 2016-02-28 DIAGNOSIS — M5416 Radiculopathy, lumbar region: Secondary | ICD-10-CM

## 2016-02-28 DIAGNOSIS — M1732 Unilateral post-traumatic osteoarthritis, left knee: Secondary | ICD-10-CM

## 2016-02-28 DIAGNOSIS — M19041 Primary osteoarthritis, right hand: Secondary | ICD-10-CM

## 2016-02-28 DIAGNOSIS — M19042 Primary osteoarthritis, left hand: Secondary | ICD-10-CM

## 2016-02-28 MED ORDER — MELOXICAM 15 MG PO TABS: 15.0000 mg | ORAL_TABLET | Freq: Every day | ORAL | 3 refills | Status: DC

## 2016-03-01 ENCOUNTER — Encounter
Payer: Commercial Managed Care - HMO | Attending: Physical Medicine & Rehabilitation | Admitting: Physical Medicine & Rehabilitation

## 2016-03-01 ENCOUNTER — Encounter: Payer: Self-pay | Admitting: Physical Medicine & Rehabilitation

## 2016-03-01 VITALS — BP 108/71 | HR 79 | Resp 14

## 2016-03-01 DIAGNOSIS — M1712 Unilateral primary osteoarthritis, left knee: Secondary | ICD-10-CM | POA: Diagnosis not present

## 2016-03-01 DIAGNOSIS — K449 Diaphragmatic hernia without obstruction or gangrene: Secondary | ICD-10-CM | POA: Insufficient documentation

## 2016-03-01 DIAGNOSIS — M4716 Other spondylosis with myelopathy, lumbar region: Secondary | ICD-10-CM

## 2016-03-01 DIAGNOSIS — R42 Dizziness and giddiness: Secondary | ICD-10-CM | POA: Diagnosis not present

## 2016-03-01 DIAGNOSIS — M19042 Primary osteoarthritis, left hand: Secondary | ICD-10-CM | POA: Diagnosis not present

## 2016-03-01 DIAGNOSIS — R202 Paresthesia of skin: Secondary | ICD-10-CM | POA: Diagnosis not present

## 2016-03-01 DIAGNOSIS — I739 Peripheral vascular disease, unspecified: Secondary | ICD-10-CM | POA: Diagnosis not present

## 2016-03-01 DIAGNOSIS — M961 Postlaminectomy syndrome, not elsewhere classified: Secondary | ICD-10-CM | POA: Diagnosis not present

## 2016-03-01 DIAGNOSIS — M19241 Secondary osteoarthritis, right hand: Secondary | ICD-10-CM

## 2016-03-01 DIAGNOSIS — M4726 Other spondylosis with radiculopathy, lumbar region: Secondary | ICD-10-CM | POA: Diagnosis not present

## 2016-03-01 DIAGNOSIS — M19041 Primary osteoarthritis, right hand: Secondary | ICD-10-CM | POA: Insufficient documentation

## 2016-03-01 DIAGNOSIS — E785 Hyperlipidemia, unspecified: Secondary | ICD-10-CM | POA: Insufficient documentation

## 2016-03-01 DIAGNOSIS — Z87891 Personal history of nicotine dependence: Secondary | ICD-10-CM | POA: Insufficient documentation

## 2016-03-01 DIAGNOSIS — M1711 Unilateral primary osteoarthritis, right knee: Secondary | ICD-10-CM | POA: Diagnosis not present

## 2016-03-01 DIAGNOSIS — M7581 Other shoulder lesions, right shoulder: Secondary | ICD-10-CM | POA: Insufficient documentation

## 2016-03-01 DIAGNOSIS — K219 Gastro-esophageal reflux disease without esophagitis: Secondary | ICD-10-CM | POA: Diagnosis not present

## 2016-03-01 DIAGNOSIS — K589 Irritable bowel syndrome without diarrhea: Secondary | ICD-10-CM | POA: Insufficient documentation

## 2016-03-01 DIAGNOSIS — G8929 Other chronic pain: Secondary | ICD-10-CM | POA: Insufficient documentation

## 2016-03-01 DIAGNOSIS — M5416 Radiculopathy, lumbar region: Secondary | ICD-10-CM

## 2016-03-01 DIAGNOSIS — H3530 Unspecified macular degeneration: Secondary | ICD-10-CM | POA: Insufficient documentation

## 2016-03-01 DIAGNOSIS — R200 Anesthesia of skin: Secondary | ICD-10-CM | POA: Diagnosis not present

## 2016-03-01 DIAGNOSIS — M19011 Primary osteoarthritis, right shoulder: Secondary | ICD-10-CM | POA: Diagnosis not present

## 2016-03-01 DIAGNOSIS — M7582 Other shoulder lesions, left shoulder: Secondary | ICD-10-CM

## 2016-03-01 DIAGNOSIS — I10 Essential (primary) hypertension: Secondary | ICD-10-CM | POA: Insufficient documentation

## 2016-03-01 DIAGNOSIS — M25512 Pain in left shoulder: Secondary | ICD-10-CM | POA: Insufficient documentation

## 2016-03-01 DIAGNOSIS — M19242 Secondary osteoarthritis, left hand: Secondary | ICD-10-CM

## 2016-03-01 MED ORDER — TRAMADOL HCL 50 MG PO TABS: ORAL_TABLET | ORAL | 1 refills | Status: DC

## 2016-03-01 NOTE — Progress Notes (Signed)
PROCEDURE NOTE DX: OA OF RIGHT KNEE MAJOR JOINT INJECTION    After informed consent and preparation of the skin with betadine and isopropyl alcohol, I injected 6mg  (1cc) of celestone and 4cc of 1% lidocaine into  the right kee via antero-lateral approach. Additionally, aspiration was performed prior to injection. The patient tolerated well, and no complications were encountered. Afterward the area was cleaned and dressed. Post- injection instructions were provided.   I also refilled ultram for a 3 month rx  Follow up in 3 months.

## 2016-03-01 NOTE — Patient Instructions (Signed)
PLEASE CALL ME WITH ANY PROBLEMS OR QUESTIONS ([phone])   HAPPY HOLIDAYS!!!!                    *                * *             *   *   *         *  *   *  *  *     *  *  *  *  *  *  * *  *  *  *  *  *  *  *  *  * *               *  *               *  *               *  *  

## 2016-03-02 ENCOUNTER — Telehealth: Payer: Self-pay | Admitting: Adult Health

## 2016-03-02 ENCOUNTER — Other Ambulatory Visit: Payer: Self-pay

## 2016-03-02 DIAGNOSIS — M1711 Unilateral primary osteoarthritis, right knee: Secondary | ICD-10-CM

## 2016-03-02 DIAGNOSIS — M19041 Primary osteoarthritis, right hand: Secondary | ICD-10-CM

## 2016-03-02 DIAGNOSIS — E785 Hyperlipidemia, unspecified: Secondary | ICD-10-CM

## 2016-03-02 DIAGNOSIS — M1732 Unilateral post-traumatic osteoarthritis, left knee: Secondary | ICD-10-CM

## 2016-03-02 DIAGNOSIS — M5416 Radiculopathy, lumbar region: Secondary | ICD-10-CM

## 2016-03-02 DIAGNOSIS — M19042 Primary osteoarthritis, left hand: Secondary | ICD-10-CM

## 2016-03-02 MED ORDER — RABEPRAZOLE SODIUM 20 MG PO TBEC: 20.0000 mg | DELAYED_RELEASE_TABLET | Freq: Every day | ORAL | 1 refills | Status: DC

## 2016-03-02 MED ORDER — SIMVASTATIN 40 MG PO TABS: 40.0000 mg | ORAL_TABLET | Freq: Every day | ORAL | 1 refills | Status: DC

## 2016-03-02 MED ORDER — HYOSCYAMINE SULFATE 0.125 MG PO TABS: ORAL_TABLET | ORAL | 3 refills | Status: DC

## 2016-03-02 MED ORDER — MECLIZINE HCL 25 MG PO TABS: ORAL_TABLET | ORAL | 0 refills | Status: DC

## 2016-03-02 NOTE — Telephone Encounter (Signed)
Ok to refill 

## 2016-03-02 NOTE — Telephone Encounter (Signed)
Rx has been refilled.  

## 2016-03-02 NOTE — Telephone Encounter (Signed)
Patient called and left no voicemail as to what she needed -just phone and name dob

## 2016-03-02 NOTE — Telephone Encounter (Signed)
Pt has a new insurance and a new pharmacy for her maintenance meds. Pt request refill  hyoscyamine (LEVSIN, ANASPAZ) 0.125 MG tablet  [facility]

## 2016-03-03 MED ORDER — MELOXICAM 15 MG PO TABS: 15.0000 mg | ORAL_TABLET | Freq: Every day | ORAL | 3 refills | Status: DC

## 2016-03-03 NOTE — Telephone Encounter (Signed)
I apologize for this phone thread was hijacked by a fellow co-worker, however, the patient was asking [facility] to send her tramadol and meloxicam prescription to [facility] mail order.  This task was completed, meloxicam was sent electronically and tramadol was phoned in

## 2016-03-10 ENCOUNTER — Other Ambulatory Visit: Payer: Self-pay | Admitting: Family Medicine

## 2016-03-15 ENCOUNTER — Ambulatory Visit (INDEPENDENT_AMBULATORY_CARE_PROVIDER_SITE_OTHER): Payer: Commercial Managed Care - HMO

## 2016-03-15 ENCOUNTER — Telehealth: Payer: Self-pay

## 2016-03-15 VITALS — BP 130/80 | HR 82 | Ht 63.0 in | Wt 163.1 lb

## 2016-03-15 DIAGNOSIS — Z23 Encounter for immunization: Secondary | ICD-10-CM | POA: Diagnosis not present

## 2016-03-15 DIAGNOSIS — Z0000 Encounter for general adult medical examination without abnormal findings: Secondary | ICD-10-CM

## 2016-03-15 DIAGNOSIS — E2839 Other primary ovarian failure: Secondary | ICD-10-CM | POA: Diagnosis not present

## 2016-03-15 NOTE — Patient Instructions (Addendum)
Ms. Pain , Thank you for taking time to come for your Medicare Wellness Visit. I appreciate your ongoing commitment to your health goals. Please review the following plan we discussed and let me know if I can assist you in the future.   Will have hep c drawn at next blood draw  Took the last pneumonia vaccine today  Will order dexa scan   These are the goals we discussed: Goals    . Increase physical activity    . Reduce sodium intake    . Weight (lb) < 135 lb (61.2 kg)          Going to [employer]         This is a list of the screening recommended for you and due dates:  Health Maintenance  Topic Date Due  .  Hepatitis C: One time screening is recommended by Center for Disease Control  (CDC) for  adults born from [birth year] through 1963.   07/16/1945  . DEXA scan (bone density measurement)  10/07/2012  . Pneumonia vaccines (2 of 2 - PPSV23) 10/07/2014  . Colon Cancer Screening  02/26/2017  . Mammogram  08/04/2017  . Tetanus Vaccine  10/29/2024  . Flu Shot  Completed  . Shingles Vaccine  Completed     Bone Densitometry Introduction Bone densitometry is an imaging test that uses a special X-ray to measure the amount of calcium and other minerals in your bones (bone density). This test is also known as a bone mineral density test or dual-energy X-ray absorptiometry (DXA). The test can measure bone density at your hip and your spine. It is similar to having a regular X-ray. You may have this test to:  Diagnose a condition that causes weak or thin bones (osteoporosis).  Predict your risk of a broken bone (fracture).  Determine how well osteoporosis treatment is working. Tell a health care provider about:  Any allergies you have.  All medicines you are taking, including vitamins, herbs, eye drops, creams, and over-the-counter medicines.  Any problems you or family members have had with anesthetic medicines.  Any blood disorders you have.  Any surgeries you have  had.  Any medical conditions you have.  Possibility of pregnancy.  Any other medical test you had within the previous 14 days that used contrast material. What are the risks? Generally, this is a safe procedure. However, problems can occur and may include the following:  This test exposes you to a very small amount of radiation.  The risks of radiation exposure may be greater to unborn children. What happens before the procedure?  Do not take any calcium supplements for 24 hours before having the test. You can otherwise eat and drink what you usually do.  Take off all metal jewelry, eyeglasses, dental appliances, and any other metal objects. What happens during the procedure?  You may lie on an exam table. There will be an X-ray generator below you and an imaging device above you.  Other devices, such as boxes or braces, may be used to position your body properly for the scan.  You will need to lie still while the machine slowly scans your body.  The images will show up on a computer monitor. What happens after the procedure? You may need more testing at a later time. This information is not intended to replace advice given to you by your health care provider. Make sure you discuss any questions you have with your health care provider. Document Released: 04/04/2004 Document  Revised: 08/19/2015 Document Reviewed: 08/21/2013  2017 [employer]   Fall Prevention in the Home Introduction Falls can cause injuries. They can happen to people of all ages. There are many things you can do to make your home safe and to help prevent falls. What can I do on the outside of my home?  Regularly fix the edges of walkways and driveways and fix any cracks.  Remove anything that might make you trip as you walk through a door, such as a raised step or threshold.  Trim any bushes or trees on the path to your home.  Use bright outdoor lighting.  Clear any walking paths of anything that might  make someone trip, such as rocks or tools.  Regularly check to see if handrails are loose or broken. Make sure that both sides of any steps have handrails.  Any raised decks and porches should have guardrails on the edges.  Have any leaves, snow, or ice cleared regularly.  Use sand or salt on walking paths during winter.  Clean up any spills in your garage right away. This includes oil or grease spills. What can I do in the bathroom?  Use night lights.  Install grab bars by the toilet and in the tub and shower. Do not use towel bars as grab bars.  Use non-skid mats or decals in the tub or shower.  If you need to sit down in the shower, use a plastic, non-slip stool.  Keep the floor dry. Clean up any water that spills on the floor as soon as it happens.  Remove soap buildup in the tub or shower regularly.  Attach bath mats securely with double-sided non-slip rug tape.  Do not have throw rugs and other things on the floor that can make you trip. What can I do in the bedroom?  Use night lights.  Make sure that you have a light by your bed that is easy to reach.  Do not use any sheets or blankets that are too big for your bed. They should not hang down onto the floor.  Have a firm chair that has side arms. You can use this for support while you get dressed.  Do not have throw rugs and other things on the floor that can make you trip. What can I do in the kitchen?  Clean up any spills right away.  Avoid walking on wet floors.  Keep items that you use a lot in easy-to-reach places.  If you need to reach something above you, use a strong step stool that has a grab bar.  Keep electrical cords out of the way.  Do not use floor polish or wax that makes floors slippery. If you must use wax, use non-skid floor wax.  Do not have throw rugs and other things on the floor that can make you trip. What can I do with my stairs?  Do not leave any items on the stairs.  Make sure  that there are handrails on both sides of the stairs and use them. Fix handrails that are broken or loose. Make sure that handrails are as long as the stairways.  Check any carpeting to make sure that it is firmly attached to the stairs. Fix any carpet that is loose or worn.  Avoid having throw rugs at the top or bottom of the stairs. If you do have throw rugs, attach them to the floor with carpet tape.  Make sure that you have a light switch at the top  of the stairs and the bottom of the stairs. If you do not have them, ask someone to add them for you. What else can I do to help prevent falls?  Wear shoes that:  Do not have high heels.  Have rubber bottoms.  Are comfortable and fit you well.  Are closed at the toe. Do not wear sandals.  If you use a stepladder:  Make sure that it is fully opened. Do not climb a closed stepladder.  Make sure that both sides of the stepladder are locked into place.  Ask someone to hold it for you, if possible.  Clearly mark and make sure that you can see:  Any grab bars or handrails.  First and last steps.  Where the edge of each step is.  Use tools that help you move around (mobility aids) if they are needed. These include:  Canes.  Walkers.  Scooters.  Crutches.  Turn on the lights when you go into a dark area. Replace any light bulbs as soon as they burn out.  Set up your furniture so you have a clear path. Avoid moving your furniture around.  If any of your floors are uneven, fix them.  If there are any pets around you, be aware of where they are.  Review your medicines with your doctor. Some medicines can make you feel dizzy. This can increase your chance of falling. Ask your doctor what other things that you can do to help prevent falls. This information is not intended to replace advice given to you by your health care provider. Make sure you discuss any questions you have with your health care provider. Document  Released: 01/07/2009 Document Revised: 08/19/2015 Document Reviewed: 04/17/2014  2017 [employer]  Health Maintenance, Female Introduction Adopting a healthy lifestyle and getting preventive care can go a long way to promote health and wellness. Talk with your health care provider about what schedule of regular examinations is right for you. This is a good chance for you to check in with your provider about disease prevention and staying healthy. In between checkups, there are plenty of things you can do on your own. Experts have done a lot of research about which lifestyle changes and preventive measures are most likely to keep you healthy. Ask your health care provider for more information. Weight and diet Eat a healthy diet  Be sure to include plenty of vegetables, fruits, low-fat dairy products, and lean protein.  Do not eat a lot of foods high in solid fats, added sugars, or salt.  Get regular exercise. This is one of the most important things you can do for your health.  Most adults should exercise for at least 150 minutes each week. The exercise should increase your heart rate and make you sweat (moderate-intensity exercise).  Most adults should also do strengthening exercises at least twice a week. This is in addition to the moderate-intensity exercise. Maintain a healthy weight  Body mass index (BMI) is a measurement that can be used to identify possible weight problems. It estimates body fat based on height and weight. Your health care provider can help determine your BMI and help you achieve or maintain a healthy weight.  For females 73 years of age and older:  A BMI below 18.5 is considered underweight.  A BMI of 18.5 to 24.9 is normal.  A BMI of 25 to 29.9 is considered overweight.  A BMI of 30 and above is considered obese. Watch levels of cholesterol and blood  lipids  You should start having your blood tested for lipids and cholesterol at 68 years of age, then have this  test every 5 years.  You may need to have your cholesterol levels checked more often if:  Your lipid or cholesterol levels are high.  You are older than 68 years of age.  You are at high risk for heart disease. Cancer screening Lung Cancer  Lung cancer screening is recommended for adults 83-5 years old who are at high risk for lung cancer because of a history of smoking.  A yearly low-dose CT scan of the lungs is recommended for people who:  Currently smoke.  Have quit within the past 15 years.  Have at least a 30-pack-year history of smoking. A pack year is smoking an average of one pack of cigarettes a day for 1 year.  Yearly screening should continue until it has been 15 years since you quit.  Yearly screening should stop if you develop a health problem that would prevent you from having lung cancer treatment. Breast Cancer  Practice breast self-awareness. This means understanding how your breasts normally appear and feel.  It also means doing regular breast self-exams. Let your health care provider know about any changes, no matter how small.  If you are in your 20s or 30s, you should have a clinical breast exam (CBE) by a health care provider every 1-3 years as part of a regular health exam.  If you are 22 or older, have a CBE every year. Also consider having a breast X-ray (mammogram) every year.  If you have a family history of breast cancer, talk to your health care provider about genetic screening.  If you are at high risk for breast cancer, talk to your health care provider about having an MRI and a mammogram every year.  Breast cancer gene (BRCA) assessment is recommended for women who have family members with BRCA-related cancers. BRCA-related cancers include:  Breast.  Ovarian.  Tubal.  Peritoneal cancers.  Results of the assessment will determine the need for genetic counseling and BRCA1 and BRCA2 testing. Cervical Cancer  Your health care provider  may recommend that you be screened regularly for cancer of the pelvic organs (ovaries, uterus, and vagina). This screening involves a pelvic examination, including checking for microscopic changes to the surface of your cervix (Pap test). You may be encouraged to have this screening done every 3 years, beginning at age 106.  For women ages 27-65, health care providers may recommend pelvic exams and Pap testing every 3 years, or they may recommend the Pap and pelvic exam, combined with testing for human papilloma virus (HPV), every 5 years. Some types of HPV increase your risk of cervical cancer. Testing for HPV may also be done on women of any age with unclear Pap test results.  Other health care providers may not recommend any screening for nonpregnant women who are considered low risk for pelvic cancer and who do not have symptoms. Ask your health care provider if a screening pelvic exam is right for you.  If you have had past treatment for cervical cancer or a condition that could lead to cancer, you need Pap tests and screening for cancer for at least 20 years after your treatment. If Pap tests have been discontinued, your risk factors (such as having a new sexual partner) need to be reassessed to determine if screening should resume. Some women have medical problems that increase the chance of getting cervical cancer. In these  cases, your health care provider may recommend more frequent screening and Pap tests. Colorectal Cancer  This type of cancer can be detected and often prevented.  Routine colorectal cancer screening usually begins at 68 years of age and continues through 68 years of age.  Your health care provider may recommend screening at an earlier age if you have risk factors for colon cancer.  Your health care provider may also recommend using home test kits to check for hidden blood in the stool.  A small camera at the end of a tube can be used to examine your colon directly  (sigmoidoscopy or colonoscopy). This is done to check for the earliest forms of colorectal cancer.  Routine screening usually begins at age 14.  Direct examination of the colon should be repeated every 5-10 years through 68 years of age. However, you may need to be screened more often if early forms of precancerous polyps or small growths are found. Skin Cancer  Check your skin from head to toe regularly.  Tell your health care provider about any new moles or changes in moles, especially if there is a change in a mole's shape or color.  Also tell your health care provider if you have a mole that is larger than the size of a pencil eraser.  Always use sunscreen. Apply sunscreen liberally and repeatedly throughout the day.  Protect yourself by wearing long sleeves, pants, a wide-brimmed hat, and sunglasses whenever you are outside. Heart disease, diabetes, and high blood pressure  High blood pressure causes heart disease and increases the risk of stroke. High blood pressure is more likely to develop in:  People who have blood pressure in the high end of the normal range (130-139/85-89 mm Hg).  People who are overweight or obese.  People who are African American.  If you are 37-59 years of age, have your blood pressure checked every 3-5 years. If you are 60 years of age or older, have your blood pressure checked every year. You should have your blood pressure measured twice-once when you are at a hospital or clinic, and once when you are not at a hospital or clinic. Record the average of the two measurements. To check your blood pressure when you are not at a hospital or clinic, you can use:  An automated blood pressure machine at a pharmacy.  A home blood pressure monitor.  If you are between 63 years and 38 years old, ask your health care provider if you should take aspirin to prevent strokes.  Have regular diabetes screenings. This involves taking a blood sample to check your  fasting blood sugar level.  If you are at a normal weight and have a low risk for diabetes, have this test once every three years after 68 years of age.  If you are overweight and have a high risk for diabetes, consider being tested at a younger age or more often. Preventing infection Hepatitis B  If you have a higher risk for hepatitis B, you should be screened for this virus. You are considered at high risk for hepatitis B if:  You were born in a country where hepatitis B is common. Ask your health care provider which countries are considered high risk.  Your parents were born in a high-risk country, and you have not been immunized against hepatitis B (hepatitis B vaccine).  You have HIV or AIDS.  You use needles to inject street drugs.  You live with someone who has hepatitis B.  You have had sex with someone who has hepatitis B.  You get hemodialysis treatment.  You take certain medicines for conditions, including cancer, organ transplantation, and autoimmune conditions. Hepatitis C  Blood testing is recommended for:  Everyone born from [birth year] through 1963.  Anyone with known risk factors for hepatitis C. Sexually transmitted infections (STIs)  You should be screened for sexually transmitted infections (STIs) including gonorrhea and chlamydia if:  You are sexually active and are younger than 68 years of age.  You are older than 68 years of age and your health care provider tells you that you are at risk for this type of infection.  Your sexual activity has changed since you were last screened and you are at an increased risk for chlamydia or gonorrhea. Ask your health care provider if you are at risk.  If you do not have HIV, but are at risk, it may be recommended that you take a prescription medicine daily to prevent HIV infection. This is called pre-exposure prophylaxis (PrEP). You are considered at risk if:  You are sexually active and do not regularly use condoms or  know the HIV status of your partner(s).  You take drugs by injection.  You are sexually active with a partner who has HIV. Talk with your health care provider about whether you are at high risk of being infected with HIV. If you choose to begin PrEP, you should first be tested for HIV. You should then be tested every 3 months for as long as you are taking PrEP. Pregnancy  If you are premenopausal and you may become pregnant, ask your health care provider about preconception counseling.  If you may become pregnant, take 400 to 800 micrograms (mcg) of folic acid every day.  If you want to prevent pregnancy, talk to your health care provider about birth control (contraception). Osteoporosis and menopause  Osteoporosis is a disease in which the bones lose minerals and strength with aging. This can result in serious bone fractures. Your risk for osteoporosis can be identified using a bone density scan.  If you are 53 years of age or older, or if you are at risk for osteoporosis and fractures, ask your health care provider if you should be screened.  Ask your health care provider whether you should take a calcium or vitamin D supplement to lower your risk for osteoporosis.  Menopause may have certain physical symptoms and risks.  Hormone replacement therapy may reduce some of these symptoms and risks. Talk to your health care provider about whether hormone replacement therapy is right for you. Follow these instructions at home:  Schedule regular health, dental, and eye exams.  Stay current with your immunizations.  Do not use any tobacco products including cigarettes, chewing tobacco, or electronic cigarettes.  If you are pregnant, do not drink alcohol.  If you are breastfeeding, limit how much and how often you drink alcohol.  Limit alcohol intake to no more than 1 drink per day for nonpregnant women. One drink equals 12 ounces of beer, 5 ounces of wine, or 1 ounces of hard  liquor.  Do not use street drugs.  Do not share needles.  Ask your health care provider for help if you need support or information about quitting drugs.  Tell your health care provider if you often feel depressed.  Tell your health care provider if you have ever been abused or do not feel safe at home. This information is not intended to replace advice given to  you by your health care provider. Make sure you discuss any questions you have with your health care provider. Document Released: 09/26/2010 Document Revised: 08/19/2015 Document Reviewed: 12/15/2014  2017 [employer]

## 2016-03-15 NOTE — Progress Notes (Signed)
Subjective:   Kelsey Sparks is a 70 y.o. female who presents for Medicare Annual (Subsequent) preventive examination.  The Patient was informed that the wellness visit is to identify future health risk and educate and initiate measures that can reduce risk for increased disease through the lifespan.    NO ROS; Medicare Wellness Visit  Psychosocial 2 dtr live in [place] Has step son; lives in [place] here when her spouse went to a and t   Describes health as good, fair or great? Good Just OA that bothers her; fingers and right knee   Preventive Screening -Counseling & Management  Colonoscopy 02/2014/ due 02/2017 Mammogram 07/2015  DEXA; no report; ordered but not followed up   Smoking history/ former smoker; stopped smoking when she had surgery in 2010  Smokeless tobacco no Second Hand Smoke status; No Smokers in the home no Spouse did smoke but had dementia - he is in a facility since April  ETOH - no   RISK FACTORS Regular exercise  Going to join silver sneaker program in [place] alone; watches TV  Socially; Sunday she goes to church Started bible study class   Diet Diet; bowl of grits; sausage or toast Lunch; collard greens and white potatoes Small piece fatback  Supper; gets a happy meal; burgers, small fry and apples  Yogurt Small cups of ice cream; popcorn cheese flavored   Fall risk; no  Mobility of Functional changes this year? No issues with ADL;s No issues w grocery shopping  Still drives;  Safety lives across from pastor; wears sunscreen does wear sunscreen  safe place for firearms;  Motor vehicle accidents; NO Will age in place there    Cardiac Risk Factors:  Advanced aged > 37 in men; >65 in women Hyperlipidemia - chol 194; trig 211; hdl 84 and LDL 109 Diabetes/ 16  Family History (mother has OA; asthma; father has OA; HTN Obesity  Eye exam; macular degeneration  Eyes checked; new glasses; Dr. Tommy Rainwater  Does not have loss of  vision w MD  Depression Screen PhQ 2: negative  Activities of Daily Living - See functional screen   Cognitive testing; Ad8 score; 0 or less than 2  MMSE deferred or completed if AD8 + 2 issues  Advanced Directives yes and there is a copy on the chart   Patient Care Team: Dorothyann Peng, NP as PCP - General (Family Medicine)   Immunization History  Administered Date(s) Administered  . Influenza Split 12/25/2011, 11/13/2012, 11/24/2013  . Influenza Whole 01/04/2007, 01/09/2008, 02/03/2009, 12/28/2010  . Influenza, High Dose Seasonal PF 11/20/2014  . Influenza-Unspecified 11/26/2015  . Pneumococcal Conjugate-13 10/06/2013  . Pneumococcal Polysaccharide-23 07/13/2003, 08/18/2008  . Td 05/22/2005  . Tdap 10/30/2014  . Zoster 01/26/2011   Required Immunizations needed today  Screening test up to date or reviewed for plan of completion Health Maintenance Due  Topic Date Due  . Hepatitis C Screening  May 11, 1945  . DEXA SCAN  10/07/2012  . PNA vac Low Risk Adult (2 of 2 - PPSV23) 10/07/2014    Medicare now request all "baby boomers" test for possible exposure to Hepatitis C. Many may have been exposed due to dental work, tatoo's, vaccinations when young. The Hepatitis C virus is dormant for many years and then sometimes will cause liver cancer. If you gave blood in the past 15 years, you were most likely checked for Hep C. If you rec'd blood; you may want to consider testing or if you  are high risk for any other reason.    Dexa; educated; will have at the [facility]   [facility] 23 educated - will take today    Cardiac Risk Factors include: dyslipidemia;hypertension;family history of premature cardiovascular disease     Objective:     Vitals: BP 130/80   Pulse 82   Ht 5\' 3"  (1.6 m)   Wt 163 lb 2 oz (74 kg)   SpO2 98%   BMI 28.90 kg/m   Body mass index is 28.9 kg/m.   Tobacco History  Smoking Status  . Former Smoker  . Packs/day: 0.50  . Years:  25.00  . Quit date: 03/27/2008  Smokeless Tobacco  . Never Used     Counseling given: Yes   Past Medical History:  Diagnosis Date  . Adenomatous colon polyp   . Allergy   . Anemia   . Arthritis   . Esophageal stricture   . GERD (gastroesophageal reflux disease)   . Hiatal hernia   . Hyperlipidemia   . Hypertension   . IBS (irritable bowel syndrome)   . Low back pain   . Macular degeneration   . Muscular degeneration   . Peripheral vascular disease [facility])    Past Surgical History:  Procedure Laterality Date  . BACK SURGERY    . CHOLECYSTECTOMY    . COLONOSCOPY    . KNEE SURGERY    . LUMBAR DISC SURGERY    . NECK SURGERY    . POLYPECTOMY    . TOTAL ABDOMINAL HYSTERECTOMY W/ BILATERAL SALPINGOOPHORECTOMY    . UPPER GASTROINTESTINAL ENDOSCOPY     Family History  Problem Relation Age of Onset  . Arthritis Father   . Hypertension Father   . Arthritis Mother   . Asthma Mother   . Breast cancer Maternal Aunt   . Colon cancer Neg Hx    History  Sexual Activity  . Sexual activity: Not on file    Outpatient Encounter Prescriptions as of 03/15/2016  Medication Sig  . aspirin 81 MG tablet Take 81 mg by mouth daily.  Marland Kitchen atenolol (TENORMIN) 50 MG tablet Take 1 tablet (50 mg total) by mouth every morning.  . baclofen (LIORESAL) 10 MG tablet Take 10 mg by mouth 2 (two) times daily.  . cetirizine (ZYRTEC) 10 MG tablet Take 10 mg by mouth daily.  . cyclobenzaprine (FLEXERIL) 10 MG tablet take 1 tablet by mouth at bedtime if needed  . diclofenac sodium (VOLTAREN) 1 % GEL Apply 2 g topically 3 (three) times daily. Three month supply  . fluticasone (FLONASE) 50 MCG/ACT nasal spray instill 2 sprays into each nostril once daily  . gabapentin (NEURONTIN) 100 MG capsule Take 1 capsule (100 mg total) by mouth 3 (three) times daily.  . hydrocortisone (ANUSOL-HC) 25 MG suppository Place 1 suppository (25 mg total) rectally 2 (two) times daily.  . hyoscyamine (ANASPAZ) 0.125 MG TBDP  disintergrating tablet take 1 tablet under the tongue every 6 hours if needed for cramping  . hyoscyamine (LEVSIN, ANASPAZ) 0.125 MG tablet Place 1 tablet under the tongue every 6 hours as needed for cramping  . meclizine (ANTIVERT) 25 MG tablet TAKE 1/2 TO 1 TABLET EVERY 6 HOURS IF NEEDED  . meloxicam (MOBIC) 15 MG tablet Take 1 tablet (15 mg total) by mouth daily.  Vladimir Faster Glycol-Propyl Glycol (SYSTANE) 0.4-0.3 % SOLN Apply to eye.  . RABEprazole (ACIPHEX) 20 MG tablet Take 1 tablet (20 mg total) by mouth daily.  . simvastatin (  ZOCOR) 40 MG tablet Take 1 tablet (40 mg total) by mouth daily.  . traMADol (ULTRAM) 50 MG tablet take 1-2 tablets by mouth every 8 hours if needed  . triamterene-hydrochlorothiazide (MAXZIDE-25) 37.5-25 MG tablet take 1 tablet by mouth every morning  . zonisamide (ZONEGRAN) 25 MG capsule Take 50 mg by mouth daily. Increase as directed   No facility-administered encounter medications on file as of 03/15/2016.     Activities of Daily Living In your present state of health, do you have any difficulty performing the following activities: 03/15/2016  Hearing? N  Vision? N  Difficulty concentrating or making decisions? N  Walking or climbing stairs? N  Dressing or bathing? N  Doing errands, shopping? N  Preparing Food and eating ? N  Using the Toilet? N  In the past six months, have you accidently leaked urine? N  Do you have problems with loss of bowel control? N  Managing your Medications? N  Managing your Finances? N  Housekeeping or managing your Housekeeping? N  Some recent data might be hidden    Patient Care Team: Dorothyann Peng, NP as PCP - General (Family Medicine)    Assessment:     Recommendations for Dexa Scan reviewed due to no fup on order   Female over the age of 87 Man age 75 or older If you broke a bone past the age of 81 Women menopausal age with risk factors (thin frame; smoker; hx of fx ) Post menopausal women under the age of 93  with risk factors A man age 74 to 58 with risk factors Other: Spine xray that is showing break of bone loss Back pain with possible break Height loss of 1/2 inch or more within one year Total loss in height of 1.5 inches from your original height  Calcium 1200mg  with Vit D 800u per day; more as directed by physician Strength building exercises discussed; can include walking; housework; small weights or stretch bands; silver sneakers if access to the Y   Exercise Activities and Dietary recommendations Current Exercise Habits: Structured exercise class, Type of exercise: strength training/weights;walking, Time (Minutes): 45, Frequency (Times/Week): 3, Weekly Exercise (Minutes/Week): 135, Intensity: Moderate (once she starts exercise; )  Goals    . Increase physical activity    . Reduce sodium intake    . Weight (lb) < 135 lb (61.2 kg)          Going to silver sneaker        Fall Risk Fall Risk  03/15/2016 03/01/2016 02/01/2016 11/30/2015 11/24/2015  Falls in the past year? No No No No No  Risk for fall due to : - - - - Medication side effect   Depression Screen PHQ 2/9 Scores 03/15/2016 11/24/2015 02/24/2015 10/27/2014  PHQ - 2 Score 0 0 2 0  PHQ- 9 Score - - 6 -     Cognitive Function MMSE - Mini Mental State Exam 03/15/2016  Not completed: (No Data)    Ad8 score 0     Immunization History  Administered Date(s) Administered  . Influenza Split 12/25/2011, 11/13/2012, 11/24/2013  . Influenza Whole 01/04/2007, 01/09/2008, 02/03/2009, 12/28/2010  . Influenza, High Dose Seasonal PF 11/20/2014  . Influenza-Unspecified 11/26/2015  . Pneumococcal Conjugate-13 10/06/2013  . Pneumococcal Polysaccharide-23 07/13/2003, 08/18/2008  . Td 05/22/2005  . Tdap 10/30/2014  . Zoster 01/26/2011   Screening Tests Health Maintenance  Topic Date Due  . Hepatitis C Screening  09/22/1945  . DEXA SCAN  10/07/2012  . PNA vac  Low Risk Adult (2 of 2 - PPSV23) 10/07/2014  . COLONOSCOPY   02/26/2017  . MAMMOGRAM  08/04/2017  . TETANUS/TDAP  10/29/2024  . INFLUENZA VACCINE  Completed  . ZOSTAVAX  Completed      Plan:    Will have hep c drawn at next blood draw  Took the last pneumonia vaccine today  Will order dexa scan   during the course of the visit the patient was educated and counseled about the following appropriate screening and preventive services:   Vaccines to include Pneumoccal, Influenza, Hepatitis B, Td, Zostavax, HCV  Electrocardiogram  Cardiovascular Disease  Colorectal cancer screening  Bone density screening  Diabetes screening  Glaucoma screening  Mammography/PAP  Nutrition counseling   Patient Instructions (the written plan) was given to the patient.   Wynetta Fines, RN  03/15/2016

## 2016-03-15 NOTE — Telephone Encounter (Signed)
This patient in for AWV  Asked if she could try something else for hyoscyamine (Levsin) states it is to expensive  Cost for 30 days was 45.00  Please advise  tks

## 2016-03-16 ENCOUNTER — Other Ambulatory Visit: Payer: Self-pay | Admitting: Adult Health

## 2016-03-16 NOTE — Progress Notes (Signed)
I have reviewed and agree with this plan  

## 2016-03-17 ENCOUNTER — Other Ambulatory Visit: Payer: Self-pay | Admitting: Adult Health

## 2016-03-17 MED ORDER — DICYCLOMINE HCL 10 MG PO CAPS: 10.0000 mg | ORAL_CAPSULE | Freq: Three times a day (TID) | ORAL | 3 refills | Status: DC

## 2016-03-17 NOTE — Telephone Encounter (Signed)
Sent in [place], take as directed

## 2016-03-17 NOTE — Telephone Encounter (Signed)
Patient notified and verbalized understanding. 

## 2016-03-22 ENCOUNTER — Other Ambulatory Visit: Payer: Self-pay | Admitting: Emergency Medicine

## 2016-03-22 MED ORDER — MECLIZINE HCL 25 MG PO TABS: ORAL_TABLET | ORAL | 0 refills | Status: AC

## 2016-03-30 ENCOUNTER — Encounter: Payer: Self-pay | Admitting: Adult Health

## 2016-03-30 ENCOUNTER — Ambulatory Visit (INDEPENDENT_AMBULATORY_CARE_PROVIDER_SITE_OTHER): Payer: Medicare HMO | Admitting: Adult Health

## 2016-03-30 VITALS — BP 126/62 | Temp 98.1°F | Ht 63.0 in | Wt 164.6 lb

## 2016-03-30 DIAGNOSIS — H6690 Otitis media, unspecified, unspecified ear: Secondary | ICD-10-CM

## 2016-03-30 MED ORDER — AMOXICILLIN 500 MG PO CAPS: 500.0000 mg | ORAL_CAPSULE | Freq: Two times a day (BID) | ORAL | 0 refills | Status: AC

## 2016-03-30 MED ORDER — FLUTICASONE PROPIONATE 50 MCG/ACT NA SUSP: NASAL | 3 refills | Status: DC

## 2016-03-30 NOTE — Progress Notes (Signed)
Subjective:    Patient ID: Kelsey Sparks, female    DOB: 02-Mar-1946, 71 y.o.   MRN: [id number]  HPI  71 year old female who  has a past medical history of Adenomatous colon polyp; Allergy; Anemia; Arthritis; Esophageal stricture; GERD (gastroesophageal reflux disease); Hiatal hernia; Hyperlipidemia; Hypertension; IBS (irritable bowel syndrome); Low back pain; Macular degeneration; Muscular degeneration; and Peripheral vascular disease ([place]). She presents to the office today with the complain of right ear pain and sore throat. Her symptoms started 4 days ago.   She denies any fevers, n/v/d or feeling acutely ill.    Review of Systems  Constitutional: Negative.   HENT: Positive for ear pain and sore throat. Negative for postnasal drip, sinus pain, sinus pressure and sneezing.   Respiratory: Negative.   Cardiovascular: Negative.   Neurological: Negative.   All other systems reviewed and are negative.  Past Medical History:  Diagnosis Date  . Adenomatous colon polyp   . Allergy   . Anemia   . Arthritis   . Esophageal stricture   . GERD (gastroesophageal reflux disease)   . Hiatal hernia   . Hyperlipidemia   . Hypertension   . IBS (irritable bowel syndrome)   . Low back pain   . Macular degeneration   . Muscular degeneration   . Peripheral vascular disease [facility])     Social History   Social History  . Marital status: Married    Spouse name: N/A  . Number of children: N/A  . Years of education: N/A   Occupational History  . Not on file.   Social History Main Topics  . Smoking status: Former Smoker    Packs/day: 0.50    Years: 25.00    Quit date: 03/27/2008  . Smokeless tobacco: Never Used  . Alcohol use No  . Drug use: No  . Sexual activity: Not on file   Other Topics Concern  . Not on file   Social History Narrative  . No narrative on file    Past Surgical History:  Procedure Laterality Date  . BACK SURGERY    . CHOLECYSTECTOMY    . COLONOSCOPY    .  KNEE SURGERY    . LUMBAR DISC SURGERY    . NECK SURGERY    . POLYPECTOMY    . TOTAL ABDOMINAL HYSTERECTOMY W/ BILATERAL SALPINGOOPHORECTOMY    . UPPER GASTROINTESTINAL ENDOSCOPY      Family History  Problem Relation Age of Onset  . Arthritis Father   . Hypertension Father   . Arthritis Mother   . Asthma Mother   . Breast cancer Maternal Aunt   . Colon cancer Neg Hx     No Known Allergies  Current Outpatient Prescriptions on File Prior to Visit  Medication Sig Dispense Refill  . aspirin 81 MG tablet Take 81 mg by mouth daily.    Marland Kitchen atenolol (TENORMIN) 50 MG tablet Take 1 tablet (50 mg total) by mouth every morning. 100 tablet 3  . baclofen (LIORESAL) 10 MG tablet Take 10 mg by mouth 2 (two) times daily.    . cetirizine (ZYRTEC) 10 MG tablet Take 10 mg by mouth daily.    . cyclobenzaprine (FLEXERIL) 10 MG tablet take 1 tablet by mouth at bedtime if needed 90 tablet 1  . diclofenac sodium (VOLTAREN) 1 % GEL Apply 2 g topically 3 (three) times daily. Three month supply 9 Tube 0  . dicyclomine (BENTYL) 10 MG capsule Take 1 capsule (10 mg total) by  mouth 3 (three) times daily before meals. 90 capsule 3  . gabapentin (NEURONTIN) 100 MG capsule Take 1 capsule (100 mg total) by mouth 3 (three) times daily. 90 capsule 3  . hydrocortisone (ANUSOL-HC) 25 MG suppository Place 1 suppository (25 mg total) rectally 2 (two) times daily. 12 suppository 0  . meclizine (ANTIVERT) 25 MG tablet TAKE 1/2 TO 1 TABLET EVERY 6 HOURS IF NEEDED 40 tablet 0  . meloxicam (MOBIC) 15 MG tablet Take 1 tablet (15 mg total) by mouth daily. 30 tablet 3  . Polyethyl Glycol-Propyl Glycol (SYSTANE) 0.4-0.3 % SOLN Apply to eye.    . RABEprazole (ACIPHEX) 20 MG tablet Take 1 tablet (20 mg total) by mouth daily. 90 tablet 1  . simvastatin (ZOCOR) 40 MG tablet Take 1 tablet (40 mg total) by mouth daily. 90 tablet 1  . traMADol (ULTRAM) 50 MG tablet take 1-2 tablets by mouth every 8 hours if needed 135 tablet 1  .  triamterene-hydrochlorothiazide (MAXZIDE-25) 37.5-25 MG tablet take 1 tablet by mouth every morning 90 tablet 3  . zonisamide (ZONEGRAN) 25 MG capsule Take 50 mg by mouth daily. Increase as directed  0  . [DISCONTINUED] ferrous sulfate 325 (65 FE) MG tablet Take 325 mg by mouth daily with breakfast.       No current facility-administered medications on file prior to visit.     BP 126/62   Temp 98.1 F (36.7 C) (Oral)   Ht 5\' 3"  (1.6 m)   Wt 164 lb 9.6 oz (74.7 kg)   BMI 29.16 kg/m       Objective:   Physical Exam  Constitutional: She is oriented to person, place, and time. She appears well-developed and well-nourished. No distress.  HENT:  Head: Normocephalic and atraumatic.  Right Ear: Hearing, external ear and ear canal normal. Tympanic membrane is erythematous and bulging.  Left Ear: Hearing, tympanic membrane, external ear and ear canal normal. Tympanic membrane is not erythematous and not bulging.  Nose: No mucosal edema or rhinorrhea. Right sinus exhibits no maxillary sinus tenderness and no frontal sinus tenderness. Left sinus exhibits no maxillary sinus tenderness and no frontal sinus tenderness.  Mouth/Throat: Uvula is midline, oropharynx is clear and moist and mucous membranes are normal. No oropharyngeal exudate, posterior oropharyngeal edema, posterior oropharyngeal erythema or tonsillar abscesses.  Cardiovascular: Normal rate, regular rhythm, normal heart sounds and intact distal pulses.  Exam reveals no gallop and no friction rub.   No murmur heard. Pulmonary/Chest: Effort normal and breath sounds normal. No respiratory distress. She has no wheezes. She has no rales. She exhibits no tenderness.  Musculoskeletal: Normal range of motion. She exhibits no edema, tenderness or deformity.  Neurological: She is alert and oriented to person, place, and time.  Skin: Skin is warm and dry. No rash noted. She is not diaphoretic. No erythema. No pallor.  Psychiatric: She has a normal  mood and affect. Her behavior is normal. Judgment and thought content normal.  Nursing note and vitals reviewed.     Assessment & Plan:  1. Acute otitis media, unspecified otitis media type - fluticasone (FLONASE) 50 MCG/ACT nasal spray; instill 2 sprays into each nostril once daily  Dispense: 48 g; Refill: 3 - amoxicillin (AMOXIL) 500 MG capsule; Take 1 capsule (500 mg total) by mouth 2 (two) times daily.  Dispense: 20 capsule; Refill: 0 - Follow up if no improvement   Dorothyann Peng, NP

## 2016-04-20 ENCOUNTER — Telehealth: Payer: Self-pay

## 2016-04-20 NOTE — Telephone Encounter (Signed)
On January 25,2018 NCCSR was reviewed. No conflict was seen on the [place] with multiple prescribers. If there are any discrepancies this will be reported to her Physician.

## 2016-04-25 ENCOUNTER — Encounter: Payer: Self-pay | Admitting: Adult Health

## 2016-04-25 ENCOUNTER — Ambulatory Visit (INDEPENDENT_AMBULATORY_CARE_PROVIDER_SITE_OTHER): Payer: Medicare HMO | Admitting: Adult Health

## 2016-04-25 VITALS — BP 120/64 | Temp 98.5°F | Wt 159.6 lb

## 2016-04-25 DIAGNOSIS — R05 Cough: Secondary | ICD-10-CM

## 2016-04-25 DIAGNOSIS — R059 Cough, unspecified: Secondary | ICD-10-CM

## 2016-04-25 MED ORDER — HYDROCODONE-HOMATROPINE 5-1.5 MG/5ML PO SYRP: 5.0000 mL | ORAL_SOLUTION | Freq: Three times a day (TID) | ORAL | 0 refills | Status: DC | PRN

## 2016-04-25 NOTE — Progress Notes (Signed)
Subjective:    Patient ID: Kelsey Sparks, female    DOB: [birth year], 70 y.o.   MRN: [id number]  Cough  This is a new problem. The current episode started in the past 7 days (3 days ). The problem has been unchanged. The cough is non-productive. Associated symptoms include rhinorrhea. Pertinent negatives include no chest pain, fever, headaches, nasal congestion, postnasal drip, sore throat or shortness of breath. The symptoms are aggravated by lying down. She has tried OTC cough suppressant for the symptoms. The treatment provided no relief. There is no history of asthma, bronchitis or pneumonia.      Review of Systems  Constitutional: Negative for fever.  HENT: Positive for rhinorrhea. Negative for postnasal drip and sore throat.   Respiratory: Positive for cough. Negative for shortness of breath.   Cardiovascular: Negative for chest pain.  Neurological: Negative for headaches.     Past Medical History:  Diagnosis Date  . Adenomatous colon polyp   . Allergy   . Anemia   . Arthritis   . Esophageal stricture   . GERD (gastroesophageal reflux disease)   . Hiatal hernia   . Hyperlipidemia   . Hypertension   . IBS (irritable bowel syndrome)   . Low back pain   . Macular degeneration   . Muscular degeneration   . Peripheral vascular disease [facility])     Social History   Social History  . Marital status: Married    Spouse name: N/A  . Number of children: N/A  . Years of education: N/A   Occupational History  . Not on file.   Social History Main Topics  . Smoking status: Former Smoker    Packs/day: 0.50    Years: 25.00    Quit date: 03/27/2008  . Smokeless tobacco: Never Used  . Alcohol use No  . Drug use: No  . Sexual activity: Not on file   Other Topics Concern  . Not on file   Social History Narrative  . No narrative on file    Past Surgical History:  Procedure Laterality Date  . BACK SURGERY    . CHOLECYSTECTOMY    . COLONOSCOPY    . KNEE SURGERY      . LUMBAR DISC SURGERY    . NECK SURGERY    . POLYPECTOMY    . TOTAL ABDOMINAL HYSTERECTOMY W/ BILATERAL SALPINGOOPHORECTOMY    . UPPER GASTROINTESTINAL ENDOSCOPY      Family History  Problem Relation Age of Onset  . Arthritis Father   . Hypertension Father   . Arthritis Mother   . Asthma Mother   . Breast cancer Maternal Aunt   . Colon cancer Neg Hx     No Known Allergies  Current Outpatient Prescriptions on File Prior to Visit  Medication Sig Dispense Refill  . aspirin 81 MG tablet Take 81 mg by mouth daily.    Marland Kitchen atenolol (TENORMIN) 50 MG tablet Take 1 tablet (50 mg total) by mouth every morning. 100 tablet 3  . baclofen (LIORESAL) 10 MG tablet Take 10 mg by mouth 2 (two) times daily.    . cetirizine (ZYRTEC) 10 MG tablet Take 10 mg by mouth daily.    . cyclobenzaprine (FLEXERIL) 10 MG tablet take 1 tablet by mouth at bedtime if needed 90 tablet 1  . dicyclomine (BENTYL) 10 MG capsule Take 1 capsule (10 mg total) by mouth 3 (three) times daily before meals. 90 capsule 3  . fluticasone (FLONASE) 50 MCG/ACT nasal spray instill  2 sprays into each nostril once daily 48 g 3  . gabapentin (NEURONTIN) 100 MG capsule Take 1 capsule (100 mg total) by mouth 3 (three) times daily. 90 capsule 3  . hydrocortisone (ANUSOL-HC) 25 MG suppository Place 1 suppository (25 mg total) rectally 2 (two) times daily. 12 suppository 0  . meclizine (ANTIVERT) 25 MG tablet TAKE 1/2 TO 1 TABLET EVERY 6 HOURS IF NEEDED 40 tablet 0  . meloxicam (MOBIC) 15 MG tablet Take 1 tablet (15 mg total) by mouth daily. 30 tablet 3  . Polyethyl Glycol-Propyl Glycol (SYSTANE) 0.4-0.3 % SOLN Apply to eye.    . RABEprazole (ACIPHEX) 20 MG tablet Take 1 tablet (20 mg total) by mouth daily. 90 tablet 1  . simvastatin (ZOCOR) 40 MG tablet Take 1 tablet (40 mg total) by mouth daily. 90 tablet 1  . traMADol (ULTRAM) 50 MG tablet take 1-2 tablets by mouth every 8 hours if needed 135 tablet 1  . triamterene-hydrochlorothiazide  (MAXZIDE-25) 37.5-25 MG tablet take 1 tablet by mouth every morning 90 tablet 3  . zonisamide (ZONEGRAN) 25 MG capsule Take 50 mg by mouth daily. Increase as directed  0  . [DISCONTINUED] ferrous sulfate 325 (65 FE) MG tablet Take 325 mg by mouth daily with breakfast.       No current facility-administered medications on file prior to visit.     BP 120/64 (BP Location: Left Arm, Patient Position: Sitting, Cuff Size: Small)   Temp 98.5 F (36.9 C) (Oral)   Wt 159 lb 9.6 oz (72.4 kg)   BMI 28.27 kg/m       Objective:   Physical Exam  Constitutional: She is oriented to person, place, and time. She appears well-developed and well-nourished. No distress.  HENT:  Head: Normocephalic and atraumatic.  Right Ear: External ear normal.  Left Ear: External ear normal.  Nose: Nose normal. No mucosal edema or rhinorrhea. Right sinus exhibits no maxillary sinus tenderness and no frontal sinus tenderness. Left sinus exhibits no frontal sinus tenderness.  Mouth/Throat: Uvula is midline, oropharynx is clear and moist and mucous membranes are normal. No oropharyngeal exudate.  Eyes: Conjunctivae and EOM are normal. Pupils are equal, round, and reactive to light. Right eye exhibits no discharge. Left eye exhibits no discharge.  Cardiovascular: Normal rate, regular rhythm, normal heart sounds and intact distal pulses.  Exam reveals no gallop and no friction rub.   No murmur heard. Pulmonary/Chest: Effort normal and breath sounds normal. No respiratory distress. She has no wheezes. She has no rales. She exhibits no tenderness.  Neurological: She is alert and oriented to person, place, and time.  Skin: Skin is warm and dry. No rash noted. She is not diaphoretic. No erythema. No pallor.  Psychiatric: She has a normal mood and affect. Her behavior is normal. Judgment and thought content normal.  Nursing note and vitals reviewed.     Assessment & Plan:  1. Cough - HYDROcodone-homatropine (HYCODAN) 5-1.5  MG/5ML syrup; Take 5 mLs by mouth every 8 (eight) hours as needed for cough.  Dispense: 120 mL; Refill: 0 - No signs of pneumonia or bronchitis.  - Follow up if no improvement - Add Mucinex  Dorothyann Peng, NP

## 2016-05-04 ENCOUNTER — Ambulatory Visit (INDEPENDENT_AMBULATORY_CARE_PROVIDER_SITE_OTHER): Payer: Medicare HMO | Admitting: Adult Health

## 2016-05-04 ENCOUNTER — Encounter: Payer: Self-pay | Admitting: Adult Health

## 2016-05-04 VITALS — BP 104/60 | Temp 98.1°F | Ht 63.0 in | Wt 159.8 lb

## 2016-05-04 DIAGNOSIS — M19042 Primary osteoarthritis, left hand: Secondary | ICD-10-CM | POA: Diagnosis not present

## 2016-05-04 DIAGNOSIS — M5416 Radiculopathy, lumbar region: Secondary | ICD-10-CM | POA: Diagnosis not present

## 2016-05-04 DIAGNOSIS — M19041 Primary osteoarthritis, right hand: Secondary | ICD-10-CM

## 2016-05-04 DIAGNOSIS — R05 Cough: Secondary | ICD-10-CM | POA: Diagnosis not present

## 2016-05-04 DIAGNOSIS — M1711 Unilateral primary osteoarthritis, right knee: Secondary | ICD-10-CM | POA: Diagnosis not present

## 2016-05-04 DIAGNOSIS — M1732 Unilateral post-traumatic osteoarthritis, left knee: Secondary | ICD-10-CM | POA: Diagnosis not present

## 2016-05-04 DIAGNOSIS — R059 Cough, unspecified: Secondary | ICD-10-CM

## 2016-05-04 MED ORDER — MELOXICAM 15 MG PO TABS: 15.0000 mg | ORAL_TABLET | Freq: Every day | ORAL | 3 refills | Status: DC

## 2016-05-04 MED ORDER — PREDNISONE 10 MG PO TABS: 10.0000 mg | ORAL_TABLET | Freq: Every day | ORAL | 0 refills | Status: DC

## 2016-05-04 MED ORDER — RABEPRAZOLE SODIUM 20 MG PO TBEC: 20.0000 mg | DELAYED_RELEASE_TABLET | Freq: Every day | ORAL | 1 refills | Status: DC

## 2016-05-04 NOTE — Progress Notes (Signed)
Subjective:    Patient ID: Kelsey Sparks, female    DOB: Aug 27, 1945, 71 y.o.   MRN: [id number]  Cough  This is a new problem. The problem has been unchanged. The cough is non-productive. Associated symptoms include rhinorrhea and shortness of breath. Pertinent negatives include no ear congestion, ear pain, fever, headaches, nasal congestion, postnasal drip or wheezing. She has tried prescription cough suppressant for the symptoms. The treatment provided moderate relief. There is no history of asthma, bronchiectasis or pneumonia.     Review of Systems  Constitutional: Negative for fever.  HENT: Positive for rhinorrhea. Negative for ear pain and postnasal drip.   Respiratory: Positive for cough, chest tightness and shortness of breath. Negative for wheezing.   Cardiovascular: Negative.   Genitourinary: Negative.   Neurological: Negative.  Negative for headaches.   Past Medical History:  Diagnosis Date  . Adenomatous colon polyp   . Allergy   . Anemia   . Arthritis   . Esophageal stricture   . GERD (gastroesophageal reflux disease)   . Hiatal hernia   . Hyperlipidemia   . Hypertension   . IBS (irritable bowel syndrome)   . Low back pain   . Macular degeneration   . Muscular degeneration   . Peripheral vascular disease [facility])     Social History   Social History  . Marital status: Married    Spouse name: N/A  . Number of children: N/A  . Years of education: N/A   Occupational History  . Not on file.   Social History Main Topics  . Smoking status: Former Smoker    Packs/day: 0.50    Years: 25.00    Quit date: 03/27/2008  . Smokeless tobacco: Never Used  . Alcohol use No  . Drug use: No  . Sexual activity: Not on file   Other Topics Concern  . Not on file   Social History Narrative  . No narrative on file    Past Surgical History:  Procedure Laterality Date  . BACK SURGERY    . CHOLECYSTECTOMY    . COLONOSCOPY    . KNEE SURGERY    . LUMBAR DISC SURGERY     . NECK SURGERY    . POLYPECTOMY    . TOTAL ABDOMINAL HYSTERECTOMY W/ BILATERAL SALPINGOOPHORECTOMY    . UPPER GASTROINTESTINAL ENDOSCOPY      Family History  Problem Relation Age of Onset  . Arthritis Father   . Hypertension Father   . Arthritis Mother   . Asthma Mother   . Breast cancer Maternal Aunt   . Colon cancer Neg Hx     No Known Allergies  Current Outpatient Prescriptions on File Prior to Visit  Medication Sig Dispense Refill  . aspirin 81 MG tablet Take 81 mg by mouth daily.    Marland Kitchen atenolol (TENORMIN) 50 MG tablet Take 1 tablet (50 mg total) by mouth every morning. 100 tablet 3  . baclofen (LIORESAL) 10 MG tablet Take 10 mg by mouth 2 (two) times daily.    . cetirizine (ZYRTEC) 10 MG tablet Take 10 mg by mouth daily.    . cyclobenzaprine (FLEXERIL) 10 MG tablet take 1 tablet by mouth at bedtime if needed 90 tablet 1  . dicyclomine (BENTYL) 10 MG capsule Take 1 capsule (10 mg total) by mouth 3 (three) times daily before meals. 90 capsule 3  . fluticasone (FLONASE) 50 MCG/ACT nasal spray instill 2 sprays into each nostril once daily 48 g 3  . gabapentin (  NEURONTIN) 100 MG capsule Take 1 capsule (100 mg total) by mouth 3 (three) times daily. 90 capsule 3  . HYDROcodone-homatropine (HYCODAN) 5-1.5 MG/5ML syrup Take 5 mLs by mouth every 8 (eight) hours as needed for cough. 120 mL 0  . hydrocortisone (ANUSOL-HC) 25 MG suppository Place 1 suppository (25 mg total) rectally 2 (two) times daily. 12 suppository 0  . meclizine (ANTIVERT) 25 MG tablet TAKE 1/2 TO 1 TABLET EVERY 6 HOURS IF NEEDED 40 tablet 0  . meloxicam (MOBIC) 15 MG tablet Take 1 tablet (15 mg total) by mouth daily. 30 tablet 3  . Polyethyl Glycol-Propyl Glycol (SYSTANE) 0.4-0.3 % SOLN Apply to eye.    . RABEprazole (ACIPHEX) 20 MG tablet Take 1 tablet (20 mg total) by mouth daily. 90 tablet 1  . simvastatin (ZOCOR) 40 MG tablet Take 1 tablet (40 mg total) by mouth daily. 90 tablet 1  . traMADol (ULTRAM) 50 MG  tablet take 1-2 tablets by mouth every 8 hours if needed 135 tablet 1  . triamterene-hydrochlorothiazide (MAXZIDE-25) 37.5-25 MG tablet take 1 tablet by mouth every morning 90 tablet 3  . zonisamide (ZONEGRAN) 25 MG capsule Take 50 mg by mouth daily. Increase as directed  0  . [DISCONTINUED] ferrous sulfate 325 (65 FE) MG tablet Take 325 mg by mouth daily with breakfast.       No current facility-administered medications on file prior to visit.     BP 104/60   Temp 98.1 F (36.7 C) (Oral)   Ht 5\' 3"  (1.6 m)   Wt 159 lb 12.8 oz (72.5 kg)   BMI 28.31 kg/m       Objective:   Physical Exam  Constitutional: She is oriented to person, place, and time. She appears well-developed and well-nourished. No distress.  Cardiovascular: Normal rate, regular rhythm, normal heart sounds and intact distal pulses.  Exam reveals no friction rub.   No murmur heard. Pulmonary/Chest: Effort normal and breath sounds normal. No respiratory distress. She has no wheezes. She has no rales. She exhibits no tenderness.  Neurological: She is alert and oriented to person, place, and time.  Skin: Skin is warm and dry. No rash noted. She is not diaphoretic. No erythema. No pallor.  Psychiatric: She has a normal mood and affect. Her behavior is normal. Judgment and thought content normal.  Nursing note and vitals reviewed.     Assessment & Plan:  1. Cough - Add Mucinex. Will prescribe prednisone to help with her symptoms.  - predniSONE (DELTASONE) 10 MG tablet; Take 1 tablet (10 mg total) by mouth daily with breakfast.  Dispense: 7 tablet; Refill: 0   Dorothyann Peng, NP

## 2016-05-04 NOTE — Addendum Note (Signed)
Addended by: Sandria Bales B on: 05/04/2016 02:11 PM   Modules accepted: Orders

## 2016-05-11 DIAGNOSIS — M542 Cervicalgia: Secondary | ICD-10-CM | POA: Diagnosis not present

## 2016-05-11 DIAGNOSIS — M791 Myalgia: Secondary | ICD-10-CM | POA: Diagnosis not present

## 2016-05-11 DIAGNOSIS — G518 Other disorders of facial nerve: Secondary | ICD-10-CM | POA: Diagnosis not present

## 2016-05-11 DIAGNOSIS — R51 Headache: Secondary | ICD-10-CM | POA: Diagnosis not present

## 2016-05-11 DIAGNOSIS — G43019 Migraine without aura, intractable, without status migrainosus: Secondary | ICD-10-CM | POA: Diagnosis not present

## 2016-05-17 ENCOUNTER — Encounter: Payer: Self-pay | Admitting: Podiatry

## 2016-05-17 ENCOUNTER — Ambulatory Visit (INDEPENDENT_AMBULATORY_CARE_PROVIDER_SITE_OTHER): Payer: Medicare HMO | Admitting: Podiatry

## 2016-05-17 VITALS — Ht 63.0 in | Wt 159.0 lb

## 2016-05-17 DIAGNOSIS — M79676 Pain in unspecified toe(s): Secondary | ICD-10-CM | POA: Diagnosis not present

## 2016-05-17 DIAGNOSIS — B351 Tinea unguium: Secondary | ICD-10-CM

## 2016-05-17 NOTE — Progress Notes (Signed)
Patient ID: Kelsey Sparks, female   DOB: 10/20/1945, 70 y.o.   MRN: [id number] Complaint:  Visit Type: Patient returns to my office for continued preventative foot care services. Complaint: Patient states" my nails have grown long and thick and become painful to walk and wear shoes" . The patient presents for preventative foot care services. No changes to ROS  Podiatric Exam: Vascular: dorsalis pedis and posterior tibial pulses are palpable bilateral. Capillary return is immediate. Temperature gradient is WNL. Skin turgor WNL  Sensorium: Normal Semmes Weinstein monofilament test. Normal tactile sensation bilaterally. Nail Exam: Pt has thick disfigured discolored nails with subungual debris noted bilateral entire nail hallux through fifth toenails Ulcer Exam: There is no evidence of ulcer or pre-ulcerative changes or infection. Orthopedic Exam: Muscle tone and strength are WNL. No limitations in general ROM. No crepitus or effusions noted. Foot type and digits show no abnormalities. HAV  B/L. Skin: No Porokeratosis. No infection or ulcers  Diagnosis:  Onychomycosis, , Pain in right toe, pain in left toes  Treatment & Plan Procedures and Treatment: Consent by patient was obtained for treatment procedures. The patient understood the discussion of treatment and procedures well. All questions were answered thoroughly reviewed. Debridement of mycotic and hypertrophic toenails, 1 through 5 bilateral and clearing of subungual debris. No ulceration, no infection noted.  Return Visit-Office Procedure: Patient instructed to return to the office for a follow up visit 3 months for continued evaluation and treatment.    Merion Caton DPM 

## 2016-05-23 ENCOUNTER — Other Ambulatory Visit: Payer: Self-pay | Admitting: Adult Health

## 2016-05-23 NOTE — Telephone Encounter (Signed)
Ok to refill 

## 2016-05-24 ENCOUNTER — Telehealth: Payer: Self-pay | Admitting: Physical Medicine & Rehabilitation

## 2016-05-24 ENCOUNTER — Telehealth: Payer: Self-pay | Admitting: *Deleted

## 2016-05-24 DIAGNOSIS — M1712 Unilateral primary osteoarthritis, left knee: Secondary | ICD-10-CM

## 2016-05-24 DIAGNOSIS — M19241 Secondary osteoarthritis, right hand: Secondary | ICD-10-CM

## 2016-05-24 DIAGNOSIS — M7582 Other shoulder lesions, left shoulder: Secondary | ICD-10-CM

## 2016-05-24 DIAGNOSIS — M19242 Secondary osteoarthritis, left hand: Principal | ICD-10-CM

## 2016-05-24 DIAGNOSIS — M5416 Radiculopathy, lumbar region: Secondary | ICD-10-CM

## 2016-05-24 DIAGNOSIS — M4716 Other spondylosis with myelopathy, lumbar region: Secondary | ICD-10-CM

## 2016-05-24 MED ORDER — TRAMADOL HCL 50 MG PO TABS: ORAL_TABLET | ORAL | 0 refills | Status: DC

## 2016-05-24 NOTE — Telephone Encounter (Signed)
Tramadol was called into wrong pharmacy.  She does not use [facility] but she uses [facility] on [address].

## 2016-05-24 NOTE — Telephone Encounter (Signed)
Kelsey Sparks called for a refill on her tramadol. Refill called to pharmacy and [facility] notified.  She has appt 05/30/16 with [name].

## 2016-05-24 NOTE — Telephone Encounter (Signed)
Return Ms. Lascano call, [facility] was [facility], Tr[facility]ol was discontinued. New order called into the [facility], she verbalizes understanding.

## 2016-05-30 ENCOUNTER — Encounter: Payer: Medicare HMO | Attending: Physical Medicine & Rehabilitation | Admitting: Registered Nurse

## 2016-05-30 ENCOUNTER — Ambulatory Visit (INDEPENDENT_AMBULATORY_CARE_PROVIDER_SITE_OTHER): Payer: Medicare HMO | Admitting: Adult Health

## 2016-05-30 ENCOUNTER — Encounter: Payer: Self-pay | Admitting: Adult Health

## 2016-05-30 ENCOUNTER — Encounter: Payer: Self-pay | Admitting: Registered Nurse

## 2016-05-30 VITALS — BP 112/76 | Temp 97.9°F | Ht 63.0 in | Wt 158.8 lb

## 2016-05-30 VITALS — BP 109/72 | HR 71 | Resp 14

## 2016-05-30 DIAGNOSIS — K449 Diaphragmatic hernia without obstruction or gangrene: Secondary | ICD-10-CM | POA: Insufficient documentation

## 2016-05-30 DIAGNOSIS — H3530 Unspecified macular degeneration: Secondary | ICD-10-CM | POA: Diagnosis not present

## 2016-05-30 DIAGNOSIS — E785 Hyperlipidemia, unspecified: Secondary | ICD-10-CM | POA: Insufficient documentation

## 2016-05-30 DIAGNOSIS — K219 Gastro-esophageal reflux disease without esophagitis: Secondary | ICD-10-CM | POA: Insufficient documentation

## 2016-05-30 DIAGNOSIS — M19241 Secondary osteoarthritis, right hand: Secondary | ICD-10-CM

## 2016-05-30 DIAGNOSIS — R42 Dizziness and giddiness: Secondary | ICD-10-CM | POA: Diagnosis not present

## 2016-05-30 DIAGNOSIS — R251 Tremor, unspecified: Secondary | ICD-10-CM | POA: Diagnosis not present

## 2016-05-30 DIAGNOSIS — G8929 Other chronic pain: Secondary | ICD-10-CM | POA: Diagnosis present

## 2016-05-30 DIAGNOSIS — M7581 Other shoulder lesions, right shoulder: Secondary | ICD-10-CM | POA: Diagnosis not present

## 2016-05-30 DIAGNOSIS — G894 Chronic pain syndrome: Secondary | ICD-10-CM

## 2016-05-30 DIAGNOSIS — I10 Essential (primary) hypertension: Secondary | ICD-10-CM | POA: Diagnosis not present

## 2016-05-30 DIAGNOSIS — I739 Peripheral vascular disease, unspecified: Secondary | ICD-10-CM | POA: Insufficient documentation

## 2016-05-30 DIAGNOSIS — M961 Postlaminectomy syndrome, not elsewhere classified: Secondary | ICD-10-CM | POA: Diagnosis not present

## 2016-05-30 DIAGNOSIS — M25512 Pain in left shoulder: Secondary | ICD-10-CM | POA: Insufficient documentation

## 2016-05-30 DIAGNOSIS — M19011 Primary osteoarthritis, right shoulder: Secondary | ICD-10-CM | POA: Diagnosis not present

## 2016-05-30 DIAGNOSIS — Z87891 Personal history of nicotine dependence: Secondary | ICD-10-CM | POA: Insufficient documentation

## 2016-05-30 DIAGNOSIS — M4726 Other spondylosis with radiculopathy, lumbar region: Secondary | ICD-10-CM | POA: Insufficient documentation

## 2016-05-30 DIAGNOSIS — Z5181 Encounter for therapeutic drug level monitoring: Secondary | ICD-10-CM | POA: Diagnosis not present

## 2016-05-30 DIAGNOSIS — Z79899 Other long term (current) drug therapy: Secondary | ICD-10-CM

## 2016-05-30 DIAGNOSIS — R200 Anesthesia of skin: Secondary | ICD-10-CM | POA: Diagnosis not present

## 2016-05-30 DIAGNOSIS — M1711 Unilateral primary osteoarthritis, right knee: Secondary | ICD-10-CM

## 2016-05-30 DIAGNOSIS — K589 Irritable bowel syndrome without diarrhea: Secondary | ICD-10-CM | POA: Insufficient documentation

## 2016-05-30 DIAGNOSIS — M19041 Primary osteoarthritis, right hand: Secondary | ICD-10-CM | POA: Diagnosis not present

## 2016-05-30 DIAGNOSIS — M19242 Secondary osteoarthritis, left hand: Secondary | ICD-10-CM | POA: Diagnosis not present

## 2016-05-30 DIAGNOSIS — M4716 Other spondylosis with myelopathy, lumbar region: Secondary | ICD-10-CM | POA: Diagnosis not present

## 2016-05-30 DIAGNOSIS — M19042 Primary osteoarthritis, left hand: Secondary | ICD-10-CM | POA: Diagnosis not present

## 2016-05-30 DIAGNOSIS — M1712 Unilateral primary osteoarthritis, left knee: Secondary | ICD-10-CM | POA: Insufficient documentation

## 2016-05-30 DIAGNOSIS — R202 Paresthesia of skin: Secondary | ICD-10-CM | POA: Diagnosis not present

## 2016-05-30 LAB — BASIC METABOLIC PANEL
BUN: 21 mg/dL (ref 6–23)
CALCIUM: 9.6 mg/dL (ref 8.4–10.5)
CO2: 28 mEq/L (ref 19–32)
CREATININE: 1.83 mg/dL — AB (ref 0.40–1.20)
Chloride: 107 mEq/L (ref 96–112)
GFR: 35.24 mL/min — AB (ref >60.00)
Glucose, Bld: 97 mg/dL (ref 70–99)
Potassium: 4.8 mEq/L (ref 3.5–5.1)
Sodium: 141 mEq/L (ref 135–145)

## 2016-05-30 LAB — MAGNESIUM: MAGNESIUM: 1.9 mg/dL (ref 1.5–2.5)

## 2016-05-30 MED ORDER — TRAMADOL HCL 50 MG PO TABS: ORAL_TABLET | ORAL | 1 refills | Status: DC

## 2016-05-30 NOTE — Addendum Note (Signed)
Addended by: Elmer Picker on: 05/30/2016 02:28 PM   Modules accepted: Orders

## 2016-05-30 NOTE — Progress Notes (Signed)
Subjective:    Patient ID: Kelsey Sparks, female    DOB: Jul 28, 1945, 71 y.o.   MRN: [id number]  HPI: Kelsey Sparks is a 71 year old female who returns for follow up appointment for chronic pain and medication refill. She states her pain is located in her lower back occasionally and right knee. She rates her pain 8. Her current exercise regime is walking and attending  [employer] [facility] twice week using treadmill for 30 minutes.   Pain Inventory Average Pain 9 Pain Right Now 8 My pain is sharp, burning and aching  In the last 24 hours, has pain interfered with the following? General activity 7 Relation with others 0 Enjoyment of life 9 What TIME of day is your pain at its worst? morning, night  Sleep (in general) Fair  Pain is worse with: walking, bending, standing and some activites Pain improves with: rest, heat/ice, medication and injections Relief from Meds: 9  Mobility ability to climb steps?  yes do you drive?  yes Do you have any goals in this area?  yes  Function retired Do you have any goals in this area?  yes  Neuro/Psych numbness tingling dizziness  Prior Studies Any changes since last visit?  no  Physicians involved in your care Any changes since last visit?  no   Family History  Problem Relation Age of Onset  . Arthritis Father   . Hypertension Father   . Arthritis Mother   . Asthma Mother   . Breast cancer Maternal Aunt   . Colon cancer Neg Hx    Social History   Social History  . Marital status: Married    Spouse name: N/A  . Number of children: N/A  . Years of education: N/A   Social History Main Topics  . Smoking status: Former Smoker    Packs/day: 0.50    Years: 25.00    Quit date: 03/27/2008  . Smokeless tobacco: Never Used  . Alcohol use No  . Drug use: No  . Sexual activity: Not Asked   Other Topics Concern  . None   Social History Narrative  . None   Past Surgical History:  Procedure Laterality Date  .  BACK SURGERY    . CHOLECYSTECTOMY    . COLONOSCOPY    . KNEE SURGERY    . LUMBAR DISC SURGERY    . NECK SURGERY    . POLYPECTOMY    . TOTAL ABDOMINAL HYSTERECTOMY W/ BILATERAL SALPINGOOPHORECTOMY    . UPPER GASTROINTESTINAL ENDOSCOPY     Past Medical History:  Diagnosis Date  . Adenomatous colon polyp   . Allergy   . Anemia   . Arthritis   . Esophageal stricture   . GERD (gastroesophageal reflux disease)   . Hiatal hernia   . Hyperlipidemia   . Hypertension   . IBS (irritable bowel syndrome)   . Low back pain   . Macular degeneration   . Muscular degeneration   . Peripheral vascular disease (HCC)    BP 109/72   Pulse 71   Resp 14   SpO2 96%   Opioid Risk Score:   Fall Risk Score:  `1  Depression screen PHQ 2/9  Depression screen [facility] 2/9 03/15/2016 11/24/2015 02/24/2015 10/27/2014 10/06/2013  Decreased Interest 0 0 2 0 0  Down, Depressed, Hopeless 0 0 0 0 -  PHQ - 2 Score 0 0 2 0 0  Altered sleeping - - 1 - -  Tired, decreased  energy - - 2 - -  Change in appetite - - 0 - -  Feeling bad or failure about yourself  - - 0 - -  Trouble concentrating - - 0 - -  Moving slowly or fidgety/restless - - 1 - -  Suicidal thoughts - - 0 - -  PHQ-9 Score - - 6 - -  Difficult doing work/chores - - Not difficult at all - -    Review of Systems  Constitutional: Positive for diaphoresis and unexpected weight change.  HENT: Negative.   Eyes: Negative.   Respiratory: Positive for cough and shortness of breath.   Cardiovascular: Positive for leg swelling.  Gastrointestinal: Positive for constipation.  Endocrine: Negative.   Genitourinary: Negative.   Musculoskeletal: Positive for arthralgias, back pain and myalgias.  Skin: Negative.   Allergic/Immunologic: Negative.   Neurological: Positive for dizziness and numbness.       Tingling  Hematological: Negative.   Psychiatric/Behavioral: Negative.   All other systems reviewed and are negative.      Objective:   Physical Exam   Constitutional: She is oriented to person, place, and time. She appears well-developed and well-nourished.  HENT:  Head: Normocephalic and atraumatic.  Neck: Normal range of motion. Neck supple.  Cardiovascular: Normal rate and regular rhythm.   Pulmonary/Chest: Effort normal and breath sounds normal.  Musculoskeletal:  Normal Muscle Bulk and Muscle Testing Reveals: Upper Extremities: Full ROM and Muscle Strength 5/5 Back without spinal tenderness noted Lower Extremities: Full ROM and Muscle Strength 5/5 Right Lower Extremity Flexion Produces Pain into Patella Arises from Table with ease Narrow Based Gait  Neurological: She is alert and oriented to person, place, and time.  Skin: Skin is warm and dry.  Psychiatric: She has a normal mood and affect.  Nursing note and vitals reviewed.         Assessment & Plan:  1. 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 Schedule for Right Knee Cortisone Injection with Dr. Naaman Plummer : Continue Mobic 2. Right Lumbar Radiculitis: Continue Gabapentin  3. Pain Management: Continue Tramadol and HEP  15 minutes of face to face patient care time was spent during this visit. All questions were encouraged and answered.  F/U in 1 month

## 2016-05-30 NOTE — Progress Notes (Signed)
Subjective:    Patient ID: Kelsey Sparks, female    DOB: 1945/08/10, 71 y.o.   MRN: [id number]  HPI  71 year old female who presents to the office today for the acute complaint of bilateral hand tremors. She reports having a migraine about two days ago, she took her migraine medication. One day after her migraine, she woke up with bilateral hand tremors. She reports that over the last 24 hours these tremors have "slowed down."   She reports no loss of strength  In her hands. She has not had any issues picking items up.   She has not changed any medications.   Review of Systems See HPI   Past Medical History:  Diagnosis Date  . Adenomatous colon polyp   . Allergy   . Anemia   . Arthritis   . Esophageal stricture   . GERD (gastroesophageal reflux disease)   . Hiatal hernia   . Hyperlipidemia   . Hypertension   . IBS (irritable bowel syndrome)   . Low back pain   . Macular degeneration   . Muscular degeneration   . Peripheral vascular disease [facility])     Social History   Social History  . Marital status: Married    Spouse name: N/A  . Number of children: N/A  . Years of education: N/A   Occupational History  . Not on file.   Social History Main Topics  . Smoking status: Former Smoker    Packs/day: 0.50    Years: 25.00    Quit date: 03/27/2008  . Smokeless tobacco: Never Used  . Alcohol use No  . Drug use: No  . Sexual activity: Not on file   Other Topics Concern  . Not on file   Social History Narrative  . No narrative on file    Past Surgical History:  Procedure Laterality Date  . BACK SURGERY    . CHOLECYSTECTOMY    . COLONOSCOPY    . KNEE SURGERY    . LUMBAR DISC SURGERY    . NECK SURGERY    . POLYPECTOMY    . TOTAL ABDOMINAL HYSTERECTOMY W/ BILATERAL SALPINGOOPHORECTOMY    . UPPER GASTROINTESTINAL ENDOSCOPY      Family History  Problem Relation Age of Onset  . Arthritis Father   . Hypertension Father   . Arthritis Mother   . Asthma  Mother   . Breast cancer Maternal Aunt   . Colon cancer Neg Hx     No Known Allergies  Current Outpatient Prescriptions on File Prior to Visit  Medication Sig Dispense Refill  . aspirin 81 MG tablet Take 81 mg by mouth daily.    Marland Kitchen atenolol (TENORMIN) 50 MG tablet Take 1 tablet (50 mg total) by mouth every morning. 100 tablet 3  . baclofen (LIORESAL) 10 MG tablet Take 10 mg by mouth 2 (two) times daily.    . cetirizine (ZYRTEC) 10 MG tablet Take 10 mg by mouth daily.    . cyclobenzaprine (FLEXERIL) 10 MG tablet take 1 tablet by mouth at bedtime if needed 90 tablet 1  . dicyclomine (BENTYL) 10 MG capsule TAKE 1 CAPSULE THREE TIMES DAILY BEFORE MEALS 270 capsule 3  . fluticasone (FLONASE) 50 MCG/ACT nasal spray instill 2 sprays into each nostril once daily 48 g 3  . gabapentin (NEURONTIN) 100 MG capsule Take 1 capsule (100 mg total) by mouth 3 (three) times daily. 90 capsule 3  . hydrocortisone (ANUSOL-HC) 25 MG suppository Place 1 suppository (  25 mg total) rectally 2 (two) times daily. 12 suppository 0  . meclizine (ANTIVERT) 25 MG tablet TAKE 1/2 TO 1 TABLET EVERY 6 HOURS IF NEEDED 40 tablet 0  . meloxicam (MOBIC) 15 MG tablet Take 1 tablet (15 mg total) by mouth daily. 30 tablet 3  . Polyethyl Glycol-Propyl Glycol (SYSTANE) 0.4-0.3 % SOLN Apply to eye.    . RABEprazole (ACIPHEX) 20 MG tablet Take 1 tablet (20 mg total) by mouth daily. 90 tablet 1  . simvastatin (ZOCOR) 40 MG tablet Take 1 tablet (40 mg total) by mouth daily. 90 tablet 1  . traMADol (ULTRAM) 50 MG tablet take 1-2 tablets by mouth every 8 hours if needed 135 tablet 1  . triamterene-hydrochlorothiazide (MAXZIDE-25) 37.5-25 MG tablet take 1 tablet by mouth every morning 90 tablet 3  . zonisamide (ZONEGRAN) 25 MG capsule Take 50 mg by mouth daily. Increase as directed  0  . [DISCONTINUED] ferrous sulfate 325 (65 FE) MG tablet Take 325 mg by mouth daily with breakfast.       No current facility-administered medications on file  prior to visit.     BP 112/76   Temp 97.9 F (36.6 C) (Oral)   Ht 5\' 3"  (1.6 m)   Wt 158 lb 12.8 oz (72 kg)   BMI 28.13 kg/m       Objective:   Physical Exam  Constitutional: She is oriented to person, place, and time. She appears well-developed and well-nourished. No distress.  Cardiovascular: Normal rate, regular rhythm, normal heart sounds and intact distal pulses.  Exam reveals no gallop and no friction rub.   No murmur heard. Pulmonary/Chest: Effort normal and breath sounds normal. No respiratory distress. She has no wheezes. She has no rales. She exhibits no tenderness.  Neurological: She is alert and oriented to person, place, and time. She has normal strength and normal reflexes. She displays no tremor. No cranial nerve deficit or sensory deficit. She exhibits normal muscle tone. She displays no seizure activity.  No tremors noted on exam today   Skin: Skin is warm and dry. No rash noted. She is not diaphoretic. No erythema. No pallor.  Psychiatric: She has a normal mood and affect. Her behavior is normal. Judgment and thought content normal.  Nursing note and vitals reviewed.     Assessment & Plan:  1. Tremor of both hands - I doubt this is related to medication such as gabapentin. Seems to be resolving  - Basic metabolic panel; Future - Magnesium - Will follow up with tomorrow  Dorothyann Peng, NP

## 2016-06-01 ENCOUNTER — Other Ambulatory Visit: Payer: Self-pay | Admitting: Adult Health

## 2016-06-01 DIAGNOSIS — R899 Unspecified abnormal finding in specimens from other organs, systems and tissues: Secondary | ICD-10-CM

## 2016-06-06 ENCOUNTER — Encounter (HOSPITAL_BASED_OUTPATIENT_CLINIC_OR_DEPARTMENT_OTHER): Payer: Medicare HMO | Admitting: Physical Medicine & Rehabilitation

## 2016-06-06 ENCOUNTER — Encounter: Payer: Self-pay | Admitting: Physical Medicine & Rehabilitation

## 2016-06-06 VITALS — BP 112/72 | HR 82 | Resp 14

## 2016-06-06 DIAGNOSIS — R202 Paresthesia of skin: Secondary | ICD-10-CM | POA: Diagnosis not present

## 2016-06-06 DIAGNOSIS — M7581 Other shoulder lesions, right shoulder: Secondary | ICD-10-CM | POA: Diagnosis not present

## 2016-06-06 DIAGNOSIS — M1712 Unilateral primary osteoarthritis, left knee: Secondary | ICD-10-CM

## 2016-06-06 DIAGNOSIS — M1711 Unilateral primary osteoarthritis, right knee: Secondary | ICD-10-CM | POA: Diagnosis not present

## 2016-06-06 DIAGNOSIS — M19042 Primary osteoarthritis, left hand: Secondary | ICD-10-CM | POA: Diagnosis not present

## 2016-06-06 DIAGNOSIS — R200 Anesthesia of skin: Secondary | ICD-10-CM | POA: Diagnosis not present

## 2016-06-06 DIAGNOSIS — M961 Postlaminectomy syndrome, not elsewhere classified: Secondary | ICD-10-CM | POA: Diagnosis not present

## 2016-06-06 DIAGNOSIS — M19041 Primary osteoarthritis, right hand: Secondary | ICD-10-CM | POA: Diagnosis not present

## 2016-06-06 DIAGNOSIS — M19011 Primary osteoarthritis, right shoulder: Secondary | ICD-10-CM | POA: Diagnosis not present

## 2016-06-06 DIAGNOSIS — R42 Dizziness and giddiness: Secondary | ICD-10-CM | POA: Diagnosis not present

## 2016-06-06 MED ORDER — DICLOFENAC SODIUM 1 % TD GEL: 1.0000 "application " | Freq: Three times a day (TID) | TRANSDERMAL | 4 refills | Status: DC

## 2016-06-06 NOTE — Progress Notes (Signed)
Procedure: right knee injectoin DX: endstage OA right knee   After informed consent and preparation of the skin with betadine and isopropyl alcohol, I injected 6mg  (1cc) of celestone and 4cc of 1% lidocaine into the right knee via anterolateral approach. Additionally, aspiration was performed prior to injection. The patient tolerated well, and no complications were encountered. Afterward the area was cleaned and dressed. Post- injection instructions were provided.   I also refilled her voltaren gel   Meredith Staggers, MD, [facility] 06/06/2016

## 2016-06-06 NOTE — Patient Instructions (Signed)
PLEASE FEEL FREE TO CALL OUR OFFICE WITH ANY PROBLEMS OR QUESTIONS ([phone])      

## 2016-06-08 ENCOUNTER — Other Ambulatory Visit (INDEPENDENT_AMBULATORY_CARE_PROVIDER_SITE_OTHER): Payer: Medicare HMO

## 2016-06-08 DIAGNOSIS — R899 Unspecified abnormal finding in specimens from other organs, systems and tissues: Secondary | ICD-10-CM | POA: Diagnosis not present

## 2016-06-08 LAB — BASIC METABOLIC PANEL
BUN: 20 mg/dL (ref 6–23)
CO2: 23 mEq/L (ref 19–32)
Calcium: 9.6 mg/dL (ref 8.4–10.5)
Chloride: 107 mEq/L (ref 96–112)
Creatinine, Ser: 1.55 mg/dL — ABNORMAL HIGH (ref 0.40–1.20)
GFR: 42.68 mL/min — AB (ref >60.00)
Glucose, Bld: 79 mg/dL (ref 70–99)
Potassium: 4.2 mEq/L (ref 3.5–5.1)
Sodium: 139 mEq/L (ref 135–145)

## 2016-07-04 ENCOUNTER — Encounter: Payer: Self-pay | Admitting: Adult Health

## 2016-07-04 ENCOUNTER — Ambulatory Visit (INDEPENDENT_AMBULATORY_CARE_PROVIDER_SITE_OTHER): Payer: Medicare HMO | Admitting: Adult Health

## 2016-07-04 VITALS — BP 126/78 | HR 90 | Temp 98.1°F | Wt 161.7 lb

## 2016-07-04 DIAGNOSIS — L0291 Cutaneous abscess, unspecified: Secondary | ICD-10-CM | POA: Diagnosis not present

## 2016-07-04 MED ORDER — DOXYCYCLINE HYCLATE 100 MG PO CAPS: 100.0000 mg | ORAL_CAPSULE | Freq: Two times a day (BID) | ORAL | 0 refills | Status: AC

## 2016-07-04 NOTE — Patient Instructions (Signed)
WE NOW OFFER   [facility] FAST TRACK!!!  SAME DAY Appointments for ACUTE CARE  Such as: Sprains, Injuries, cuts, abrasions, rashes, muscle pain, joint pain, back pain Colds, flu, sore throats, headache, allergies, cough, fever  Ear pain, sinus and eye infections Abdominal pain, nausea, vomiting, diarrhea, upset stomach Animal/insect bites  3 Easy Ways to Schedule: Walk-In Scheduling Call in scheduling Mychart Sign-up: [email or web address]         

## 2016-07-04 NOTE — Progress Notes (Signed)
Pre visit review using our clinic review tool, if applicable. No additional management support is needed unless otherwise documented below in the visit note. 

## 2016-07-04 NOTE — Progress Notes (Signed)
Subjective:    Patient ID: Kelsey Sparks, female    DOB: 02/23/1946, 71 y.o.   MRN: [id number]  HPI  71 year old female who presents to the office today with the complaint of " bump on my arm". She reports that she has had a mass on her right upper arm that has become larger over the week. It is painful to touch and has been red and warm. She has not noticed any drainage.    Review of Systems  Constitutional: Negative.   Skin: Positive for color change and wound.  All other systems reviewed and are negative.  Past Medical History:  Diagnosis Date  . Adenomatous colon polyp   . Allergy   . Anemia   . Arthritis   . Esophageal stricture   . GERD (gastroesophageal reflux disease)   . Hiatal hernia   . Hyperlipidemia   . Hypertension   . IBS (irritable bowel syndrome)   . Low back pain   . Macular degeneration   . Muscular degeneration   . Peripheral vascular disease [facility])     Social History   Social History  . Marital status: Married    Spouse name: N/A  . Number of children: N/A  . Years of education: N/A   Occupational History  . Not on file.   Social History Main Topics  . Smoking status: Former Smoker    Packs/day: 0.50    Years: 25.00    Quit date: 03/27/2008  . Smokeless tobacco: Never Used  . Alcohol use No  . Drug use: No  . Sexual activity: Not on file   Other Topics Concern  . Not on file   Social History Narrative  . No narrative on file    Past Surgical History:  Procedure Laterality Date  . BACK SURGERY    . CHOLECYSTECTOMY    . COLONOSCOPY    . KNEE SURGERY    . LUMBAR DISC SURGERY    . NECK SURGERY    . POLYPECTOMY    . TOTAL ABDOMINAL HYSTERECTOMY W/ BILATERAL SALPINGOOPHORECTOMY    . UPPER GASTROINTESTINAL ENDOSCOPY      Family History  Problem Relation Age of Onset  . Arthritis Father   . Hypertension Father   . Arthritis Mother   . Asthma Mother   . Breast cancer Maternal Aunt   . Colon cancer Neg Hx     No Known  Allergies  Current Outpatient Prescriptions on File Prior to Visit  Medication Sig Dispense Refill  . aspirin 81 MG tablet Take 81 mg by mouth daily.    Marland Kitchen atenolol (TENORMIN) 50 MG tablet Take 1 tablet (50 mg total) by mouth every morning. 100 tablet 3  . baclofen (LIORESAL) 10 MG tablet Take 10 mg by mouth 2 (two) times daily.    . cetirizine (ZYRTEC) 10 MG tablet Take 10 mg by mouth daily.    . cyclobenzaprine (FLEXERIL) 10 MG tablet take 1 tablet by mouth at bedtime if needed 90 tablet 1  . diclofenac sodium (VOLTAREN) 1 % GEL Apply 1 application topically 3 (three) times daily. Right knee 3 Tube 4  . dicyclomine (BENTYL) 10 MG capsule TAKE 1 CAPSULE THREE TIMES DAILY BEFORE MEALS 270 capsule 3  . fluticasone (FLONASE) 50 MCG/ACT nasal spray instill 2 sprays into each nostril once daily 48 g 3  . gabapentin (NEURONTIN) 100 MG capsule Take 1 capsule (100 mg total) by mouth 3 (three) times daily. 90 capsule 3  .  hydrocortisone (ANUSOL-HC) 25 MG suppository Place 1 suppository (25 mg total) rectally 2 (two) times daily. 12 suppository 0  . meclizine (ANTIVERT) 25 MG tablet TAKE 1/2 TO 1 TABLET EVERY 6 HOURS IF NEEDED 40 tablet 0  . meloxicam (MOBIC) 15 MG tablet Take 1 tablet (15 mg total) by mouth daily. 30 tablet 3  . Polyethyl Glycol-Propyl Glycol (SYSTANE) 0.4-0.3 % SOLN Apply to eye.    . RABEprazole (ACIPHEX) 20 MG tablet Take 1 tablet (20 mg total) by mouth daily. 90 tablet 1  . simvastatin (ZOCOR) 40 MG tablet Take 1 tablet (40 mg total) by mouth daily. 90 tablet 1  . traMADol (ULTRAM) 50 MG tablet take 1-2 tablets by mouth every 8 hours if needed 135 tablet 1  . triamterene-hydrochlorothiazide (MAXZIDE-25) 37.5-25 MG tablet take 1 tablet by mouth every morning 90 tablet 3  . zonisamide (ZONEGRAN) 25 MG capsule Take 50 mg by mouth daily. Increase as directed  0  . [DISCONTINUED] ferrous sulfate 325 (65 FE) MG tablet Take 325 mg by mouth daily with breakfast.       No current  facility-administered medications on file prior to visit.     BP 126/78 (BP Location: Left Arm, Patient Position: Sitting, Cuff Size: Normal)   Pulse 90   Temp 98.1 F (36.7 C) (Oral)   Wt 161 lb 11.2 oz (73.3 kg)   SpO2 96%   BMI 28.64 kg/m       Objective:   Physical Exam  Constitutional: She is oriented to person, place, and time. She appears well-developed and well-nourished. No distress.  Cardiovascular: Normal rate, regular rhythm, normal heart sounds and intact distal pulses.  Exam reveals no gallop and no friction rub.   No murmur heard. Pulmonary/Chest: Effort normal and breath sounds normal. No respiratory distress. She has no wheezes. She has no rales. She exhibits no tenderness.  Neurological: She is alert and oriented to person, place, and time.  Skin: Skin is warm and dry. She is not diaphoretic. There is erythema.  Quarter sized firm abscess on right upper arm. Redness and warmth noted. No drainage     Psychiatric: She has a normal mood and affect. Her behavior is normal. Judgment and thought content normal.  Nursing note and vitals reviewed.     Assessment & Plan:  1. Abscess - Not ready for I&D. Advised warm compress until it comes to a head and then follow up.  - doxycycline (VIBRAMYCIN) 100 MG capsule; Take 1 capsule (100 mg total) by mouth 2 (two) times daily.  Dispense: 28 capsule; Refill: 0 - Will treat with antibiotics at this time   [facility]

## 2016-07-12 ENCOUNTER — Encounter: Payer: Self-pay | Admitting: Adult Health

## 2016-07-12 ENCOUNTER — Ambulatory Visit (INDEPENDENT_AMBULATORY_CARE_PROVIDER_SITE_OTHER): Payer: Medicare HMO | Admitting: Adult Health

## 2016-07-12 VITALS — BP 112/74 | Temp 98.2°F | Ht 63.0 in | Wt 157.7 lb

## 2016-07-12 DIAGNOSIS — L0291 Cutaneous abscess, unspecified: Secondary | ICD-10-CM

## 2016-07-12 MED ORDER — DOXYCYCLINE HYCLATE 100 MG PO CAPS: 100.0000 mg | ORAL_CAPSULE | Freq: Two times a day (BID) | ORAL | 0 refills | Status: AC

## 2016-07-12 NOTE — Progress Notes (Signed)
Subjective:    Patient ID: Kelsey Sparks, female    DOB: 1945-04-29, 71 y.o.   MRN: [id number]  HPI  71 year old female who returns to the office with drainage from abscess on her right upper arm. She was seen by me last week and prescribed doxycycline due to the being a non fluctuant. She has been taking this medication as directed. She reports that she woke up this morning and she was experiencing drainage from the abscess   Review of Systems See HPI   Past Medical History:  Diagnosis Date  . Adenomatous colon polyp   . Allergy   . Anemia   . Arthritis   . Esophageal stricture   . GERD (gastroesophageal reflux disease)   . Hiatal hernia   . Hyperlipidemia   . Hypertension   . IBS (irritable bowel syndrome)   . Low back pain   . Macular degeneration   . Muscular degeneration   . Peripheral vascular disease [facility])     Social History   Social History  . Marital status: Married    Spouse name: N/A  . Number of children: N/A  . Years of education: N/A   Occupational History  . Not on file.   Social History Main Topics  . Smoking status: Former Smoker    Packs/day: 0.50    Years: 25.00    Quit date: 03/27/2008  . Smokeless tobacco: Never Used  . Alcohol use No  . Drug use: No  . Sexual activity: Not on file   Other Topics Concern  . Not on file   Social History Narrative  . No narrative on file    Past Surgical History:  Procedure Laterality Date  . BACK SURGERY    . CHOLECYSTECTOMY    . COLONOSCOPY    . KNEE SURGERY    . LUMBAR DISC SURGERY    . NECK SURGERY    . POLYPECTOMY    . TOTAL ABDOMINAL HYSTERECTOMY W/ BILATERAL SALPINGOOPHORECTOMY    . UPPER GASTROINTESTINAL ENDOSCOPY      Family History  Problem Relation Age of Onset  . Arthritis Father   . Hypertension Father   . Arthritis Mother   . Asthma Mother   . Breast cancer Maternal Aunt   . Colon cancer Neg Hx     No Known Allergies  Current Outpatient Prescriptions on File Prior  to Visit  Medication Sig Dispense Refill  . aspirin 81 MG tablet Take 81 mg by mouth daily.    Marland Kitchen atenolol (TENORMIN) 50 MG tablet Take 1 tablet (50 mg total) by mouth every morning. 100 tablet 3  . baclofen (LIORESAL) 10 MG tablet Take 10 mg by mouth 2 (two) times daily.    . cetirizine (ZYRTEC) 10 MG tablet Take 10 mg by mouth daily.    . cyclobenzaprine (FLEXERIL) 10 MG tablet take 1 tablet by mouth at bedtime if needed 90 tablet 1  . diclofenac sodium (VOLTAREN) 1 % GEL Apply 1 application topically 3 (three) times daily. Right knee 3 Tube 4  . dicyclomine (BENTYL) 10 MG capsule TAKE 1 CAPSULE THREE TIMES DAILY BEFORE MEALS 270 capsule 3  . doxycycline (VIBRAMYCIN) 100 MG capsule Take 1 capsule (100 mg total) by mouth 2 (two) times daily. 28 capsule 0  . fluticasone (FLONASE) 50 MCG/ACT nasal spray instill 2 sprays into each nostril once daily 48 g 3  . gabapentin (NEURONTIN) 100 MG capsule Take 1 capsule (100 mg total) by mouth 3 (  three) times daily. 90 capsule 3  . hydrocortisone (ANUSOL-HC) 25 MG suppository Place 1 suppository (25 mg total) rectally 2 (two) times daily. 12 suppository 0  . meclizine (ANTIVERT) 25 MG tablet TAKE 1/2 TO 1 TABLET EVERY 6 HOURS IF NEEDED 40 tablet 0  . meloxicam (MOBIC) 15 MG tablet Take 1 tablet (15 mg total) by mouth daily. 30 tablet 3  . Polyethyl Glycol-Propyl Glycol (SYSTANE) 0.4-0.3 % SOLN Apply to eye.    . RABEprazole (ACIPHEX) 20 MG tablet Take 1 tablet (20 mg total) by mouth daily. 90 tablet 1  . simvastatin (ZOCOR) 40 MG tablet Take 1 tablet (40 mg total) by mouth daily. 90 tablet 1  . traMADol (ULTRAM) 50 MG tablet take 1-2 tablets by mouth every 8 hours if needed 135 tablet 1  . triamterene-hydrochlorothiazide (MAXZIDE-25) 37.5-25 MG tablet take 1 tablet by mouth every morning 90 tablet 3  . zonisamide (ZONEGRAN) 25 MG capsule Take 50 mg by mouth daily. Increase as directed  0  . [DISCONTINUED] ferrous sulfate 325 (65 FE) MG tablet Take 325 mg by  mouth daily with breakfast.       No current facility-administered medications on file prior to visit.     BP 112/74 (BP Location: Left Arm, Patient Position: Sitting, Cuff Size: Normal)   Temp 98.2 F (36.8 C) (Oral)   Ht 5\' 3"  (1.6 m)   Wt 157 lb 11.2 oz (71.5 kg)   BMI 27.94 kg/m       Objective:   Physical Exam  Constitutional: She is oriented to person, place, and time. She appears well-developed and well-nourished. No distress.  Musculoskeletal: Normal range of motion. She exhibits no edema, tenderness or deformity.  Neurological: She is alert and oriented to person, place, and time.  Skin: Skin is warm and dry. No rash noted. She is not diaphoretic. No erythema. No pallor.  Abscess noted with purulent drainage on right upper arm. Localized redness, warmth and pain.   Psychiatric: She has a normal mood and affect. Her behavior is normal. Thought content normal.      Assessment & Plan:  1. Abscess Procedure:  Incision and drainage of abscess Risks, benefits, and alternatives explained and consent obtained. Time out conducted. Surface cleaned with alcohol.  2.5 cc lidocaine with epinephine infiltrated around abscess. Adequate anesthesia ensured. Area prepped and draped in a sterile fashion. #15 blade used to make a stab incision into abscess. Pus expressed with pressure. Curved hemostat used to explore 4 quadrants and loculations broken up. Further purulence expressed.  inches of iodoform packing placed leaving a 1-inch tail.Remove in 24 hours  Hemostasis achieved. Pt stable. Aftercare and follow-up advised. - will keep her on doxycycline for an additional 6 days.  - follow up in one week or sooner if needed - WOUND CULTURE  Dorothyann Peng, NP

## 2016-07-12 NOTE — Patient Instructions (Signed)
It was great seeing you today   Please pull that string out tomorrow evening, then just place a bandaid over the wound   I am going to continue your antibiotics for 6 additional days   Please follow up with me in 1 week

## 2016-07-14 ENCOUNTER — Ambulatory Visit: Payer: Medicare HMO | Admitting: Adult Health

## 2016-07-15 LAB — WOUND CULTURE
Gram Stain: NONE SEEN
Organism ID, Bacteria: NORMAL

## 2016-07-20 ENCOUNTER — Ambulatory Visit (INDEPENDENT_AMBULATORY_CARE_PROVIDER_SITE_OTHER): Payer: Medicare HMO | Admitting: Adult Health

## 2016-07-20 VITALS — BP 122/66 | Temp 98.3°F | Wt 157.4 lb

## 2016-07-20 DIAGNOSIS — L0291 Cutaneous abscess, unspecified: Secondary | ICD-10-CM

## 2016-07-20 DIAGNOSIS — Z5189 Encounter for other specified aftercare: Secondary | ICD-10-CM

## 2016-07-20 NOTE — Progress Notes (Signed)
Subjective:    Patient ID: Kelsey Sparks, female    DOB: [birth year], 71 y.o.   MRN: [id number]  HPI  71 year old female who presents to the office today for one week follow up after incision and drainage of abscess of right arm. She has been changing her bandage and reports very little drainage. Denies an pain or redness.   She has two days of antibiotics left.     Review of Systems See HPI   Past Medical History:  Diagnosis Date  . Adenomatous colon polyp   . Allergy   . Anemia   . Arthritis   . Esophageal stricture   . GERD (gastroesophageal reflux disease)   . Hiatal hernia   . Hyperlipidemia   . Hypertension   . IBS (irritable bowel syndrome)   . Low back pain   . Macular degeneration   . Muscular degeneration   . Peripheral vascular disease [facility])     Social History   Social History  . Marital status: Married    Spouse name: N/A  . Number of children: N/A  . Years of education: N/A   Occupational History  . Not on file.   Social History Main Topics  . Smoking status: Former Smoker    Packs/day: 0.50    Years: 25.00    Quit date: 03/27/2008  . Smokeless tobacco: Never Used  . Alcohol use No  . Drug use: No  . Sexual activity: Not on file   Other Topics Concern  . Not on file   Social History Narrative  . No narrative on file    Past Surgical History:  Procedure Laterality Date  . BACK SURGERY    . CHOLECYSTECTOMY    . COLONOSCOPY    . KNEE SURGERY    . LUMBAR DISC SURGERY    . NECK SURGERY    . POLYPECTOMY    . TOTAL ABDOMINAL HYSTERECTOMY W/ BILATERAL SALPINGOOPHORECTOMY    . UPPER GASTROINTESTINAL ENDOSCOPY      Family History  Problem Relation Age of Onset  . Arthritis Father   . Hypertension Father   . Arthritis Mother   . Asthma Mother   . Breast cancer Maternal Aunt   . Colon cancer Neg Hx     No Known Allergies  Current Outpatient Prescriptions on File Prior to Visit  Medication Sig Dispense Refill  . aspirin 81 MG  tablet Take 81 mg by mouth daily.    Marland Kitchen atenolol (TENORMIN) 50 MG tablet Take 1 tablet (50 mg total) by mouth every morning. 100 tablet 3  . baclofen (LIORESAL) 10 MG tablet Take 10 mg by mouth 2 (two) times daily.    . cetirizine (ZYRTEC) 10 MG tablet Take 10 mg by mouth daily.    . cyclobenzaprine (FLEXERIL) 10 MG tablet take 1 tablet by mouth at bedtime if needed 90 tablet 1  . diclofenac sodium (VOLTAREN) 1 % GEL Apply 1 application topically 3 (three) times daily. Right knee 3 Tube 4  . dicyclomine (BENTYL) 10 MG capsule TAKE 1 CAPSULE THREE TIMES DAILY BEFORE MEALS 270 capsule 3  . fluticasone (FLONASE) 50 MCG/ACT nasal spray instill 2 sprays into each nostril once daily 48 g 3  . gabapentin (NEURONTIN) 100 MG capsule Take 1 capsule (100 mg total) by mouth 3 (three) times daily. 90 capsule 3  . hydrocortisone (ANUSOL-HC) 25 MG suppository Place 1 suppository (25 mg total) rectally 2 (two) times daily. 12 suppository 0  . meclizine (  ANTIVERT) 25 MG tablet TAKE 1/2 TO 1 TABLET EVERY 6 HOURS IF NEEDED 40 tablet 0  . meloxicam (MOBIC) 15 MG tablet Take 1 tablet (15 mg total) by mouth daily. 30 tablet 3  . Polyethyl Glycol-Propyl Glycol (SYSTANE) 0.4-0.3 % SOLN Apply to eye.    . RABEprazole (ACIPHEX) 20 MG tablet Take 1 tablet (20 mg total) by mouth daily. 90 tablet 1  . simvastatin (ZOCOR) 40 MG tablet Take 1 tablet (40 mg total) by mouth daily. 90 tablet 1  . traMADol (ULTRAM) 50 MG tablet take 1-2 tablets by mouth every 8 hours if needed 135 tablet 1  . triamterene-hydrochlorothiazide (MAXZIDE-25) 37.5-25 MG tablet take 1 tablet by mouth every morning 90 tablet 3  . zonisamide (ZONEGRAN) 25 MG capsule Take 50 mg by mouth daily. Increase as directed  0  . [DISCONTINUED] ferrous sulfate 325 (65 FE) MG tablet Take 325 mg by mouth daily with breakfast.       No current facility-administered medications on file prior to visit.     BP 122/66 (BP Location: Left Arm, Patient Position: Sitting,  Cuff Size: Normal)   Temp 98.3 F (36.8 C) (Oral)   Wt 157 lb 6.4 oz (71.4 kg)   BMI 27.88 kg/m       Objective:   Physical Exam  Constitutional: She is oriented to person, place, and time.  Neurological: She is alert and oriented to person, place, and time.  Skin: Skin is warm and dry.  Well healing wound on right upper arm. No signs of infection noted   Psychiatric: She has a normal mood and affect. Her behavior is normal. Judgment and thought content normal.      Assessment & Plan:  1. Encounter for wound care - Continue antibiotics as directed.  - Continue with bandages, add triple antibiotic ointment.  - Follow up with any signs of infection   Dorothyann Peng, NP

## 2016-07-20 NOTE — Patient Instructions (Signed)
WE NOW OFFER   [facility] FAST TRACK!!!  SAME DAY Appointments for ACUTE CARE  Such as: Sprains, Injuries, cuts, abrasions, rashes, muscle pain, joint pain, back pain Colds, flu, sore throats, headache, allergies, cough, fever  Ear pain, sinus and eye infections Abdominal pain, nausea, vomiting, diarrhea, upset stomach Animal/insect bites  3 Easy Ways to Schedule: Walk-In Scheduling Call in scheduling Mychart Sign-up: [email or web address]         

## 2016-08-01 DIAGNOSIS — H353130 Nonexudative age-related macular degeneration, bilateral, stage unspecified: Secondary | ICD-10-CM | POA: Diagnosis not present

## 2016-08-01 DIAGNOSIS — H2513 Age-related nuclear cataract, bilateral: Secondary | ICD-10-CM | POA: Diagnosis not present

## 2016-08-01 DIAGNOSIS — H25013 Cortical age-related cataract, bilateral: Secondary | ICD-10-CM | POA: Diagnosis not present

## 2016-08-01 DIAGNOSIS — H25043 Posterior subcapsular polar age-related cataract, bilateral: Secondary | ICD-10-CM | POA: Diagnosis not present

## 2016-08-07 DIAGNOSIS — M8589 Other specified disorders of bone density and structure, multiple sites: Secondary | ICD-10-CM | POA: Diagnosis not present

## 2016-08-07 DIAGNOSIS — Z1231 Encounter for screening mammogram for malignant neoplasm of breast: Secondary | ICD-10-CM | POA: Diagnosis not present

## 2016-08-07 DIAGNOSIS — Z780 Asymptomatic menopausal state: Secondary | ICD-10-CM | POA: Diagnosis not present

## 2016-08-07 LAB — HM DEXA SCAN

## 2016-08-07 LAB — HM MAMMOGRAPHY

## 2016-08-09 ENCOUNTER — Ambulatory Visit (INDEPENDENT_AMBULATORY_CARE_PROVIDER_SITE_OTHER): Payer: Medicare HMO | Admitting: Podiatry

## 2016-08-09 DIAGNOSIS — M79676 Pain in unspecified toe(s): Secondary | ICD-10-CM | POA: Diagnosis not present

## 2016-08-09 DIAGNOSIS — B351 Tinea unguium: Secondary | ICD-10-CM | POA: Diagnosis not present

## 2016-08-09 NOTE — Progress Notes (Signed)
Patient ID: Kelsey Sparks, female   DOB: 1945-04-01, 71 y.o.   MRN: [id number] Complaint:  Visit Type: Patient returns to my office for continued preventative foot care services. Complaint: Patient states" my nails have grown long and thick and become painful to walk and wear shoes" . The patient presents for preventative foot care services. No changes to ROS  Podiatric Exam: Vascular: dorsalis pedis and posterior tibial pulses are palpable bilateral. Capillary return is immediate. Temperature gradient is WNL. Skin turgor WNL  Sensorium: Normal Semmes Weinstein monofilament test. Normal tactile sensation bilaterally. Nail Exam: Pt has thick disfigured discolored nails with subungual debris noted bilateral entire nail hallux through fifth toenails Ulcer Exam: There is no evidence of ulcer or pre-ulcerative changes or infection. Orthopedic Exam: Muscle tone and strength are WNL. No limitations in general ROM. No crepitus or effusions noted. Foot type and digits show no abnormalities. HAV  B/L. Skin: No Porokeratosis. No infection or ulcers  Diagnosis:  Onychomycosis, , Pain in right toe, pain in left toes  Treatment & Plan Procedures and Treatment: Consent by patient was obtained for treatment procedures. The patient understood the discussion of treatment and procedures well. All questions were answered thoroughly reviewed. Debridement of mycotic and hypertrophic toenails, 1 through 5 bilateral and clearing of subungual debris. No ulceration, no infection noted.  Return Visit-Office Procedure: Patient instructed to return to the office for a follow up visit 3 months for continued evaluation and treatment.    Gardiner Barefoot DPM

## 2016-08-11 ENCOUNTER — Encounter: Payer: Self-pay | Admitting: Family Medicine

## 2016-08-14 ENCOUNTER — Encounter: Payer: Self-pay | Admitting: Family Medicine

## 2016-08-14 DIAGNOSIS — M8580 Other specified disorders of bone density and structure, unspecified site: Secondary | ICD-10-CM | POA: Insufficient documentation

## 2016-08-18 ENCOUNTER — Other Ambulatory Visit: Payer: Self-pay | Admitting: Registered Nurse

## 2016-08-18 DIAGNOSIS — M1712 Unilateral primary osteoarthritis, left knee: Secondary | ICD-10-CM

## 2016-08-29 ENCOUNTER — Other Ambulatory Visit: Payer: Self-pay | Admitting: Adult Health

## 2016-08-29 DIAGNOSIS — M5416 Radiculopathy, lumbar region: Secondary | ICD-10-CM

## 2016-08-29 DIAGNOSIS — M1732 Unilateral post-traumatic osteoarthritis, left knee: Secondary | ICD-10-CM

## 2016-08-29 DIAGNOSIS — M1711 Unilateral primary osteoarthritis, right knee: Secondary | ICD-10-CM

## 2016-08-29 DIAGNOSIS — M19041 Primary osteoarthritis, right hand: Secondary | ICD-10-CM

## 2016-08-29 DIAGNOSIS — M19042 Primary osteoarthritis, left hand: Secondary | ICD-10-CM

## 2016-09-04 ENCOUNTER — Other Ambulatory Visit: Payer: Self-pay | Admitting: Family Medicine

## 2016-09-04 DIAGNOSIS — E785 Hyperlipidemia, unspecified: Secondary | ICD-10-CM

## 2016-09-06 ENCOUNTER — Encounter: Payer: Self-pay | Admitting: Physical Medicine & Rehabilitation

## 2016-09-06 ENCOUNTER — Encounter: Payer: Medicare HMO | Attending: Physical Medicine & Rehabilitation | Admitting: Physical Medicine & Rehabilitation

## 2016-09-06 VITALS — BP 116/65 | HR 77 | Resp 95

## 2016-09-06 DIAGNOSIS — K589 Irritable bowel syndrome without diarrhea: Secondary | ICD-10-CM | POA: Insufficient documentation

## 2016-09-06 DIAGNOSIS — R42 Dizziness and giddiness: Secondary | ICD-10-CM | POA: Diagnosis not present

## 2016-09-06 DIAGNOSIS — G8929 Other chronic pain: Secondary | ICD-10-CM | POA: Insufficient documentation

## 2016-09-06 DIAGNOSIS — M961 Postlaminectomy syndrome, not elsewhere classified: Secondary | ICD-10-CM | POA: Insufficient documentation

## 2016-09-06 DIAGNOSIS — M4716 Other spondylosis with myelopathy, lumbar region: Secondary | ICD-10-CM | POA: Diagnosis not present

## 2016-09-06 DIAGNOSIS — M1711 Unilateral primary osteoarthritis, right knee: Secondary | ICD-10-CM | POA: Diagnosis not present

## 2016-09-06 DIAGNOSIS — R202 Paresthesia of skin: Secondary | ICD-10-CM | POA: Insufficient documentation

## 2016-09-06 DIAGNOSIS — M7581 Other shoulder lesions, right shoulder: Secondary | ICD-10-CM | POA: Insufficient documentation

## 2016-09-06 DIAGNOSIS — M19042 Primary osteoarthritis, left hand: Secondary | ICD-10-CM | POA: Diagnosis not present

## 2016-09-06 DIAGNOSIS — M25512 Pain in left shoulder: Secondary | ICD-10-CM | POA: Insufficient documentation

## 2016-09-06 DIAGNOSIS — K449 Diaphragmatic hernia without obstruction or gangrene: Secondary | ICD-10-CM | POA: Insufficient documentation

## 2016-09-06 DIAGNOSIS — M19041 Primary osteoarthritis, right hand: Secondary | ICD-10-CM | POA: Diagnosis not present

## 2016-09-06 DIAGNOSIS — I739 Peripheral vascular disease, unspecified: Secondary | ICD-10-CM | POA: Diagnosis not present

## 2016-09-06 DIAGNOSIS — I10 Essential (primary) hypertension: Secondary | ICD-10-CM | POA: Diagnosis not present

## 2016-09-06 DIAGNOSIS — M19011 Primary osteoarthritis, right shoulder: Secondary | ICD-10-CM | POA: Insufficient documentation

## 2016-09-06 DIAGNOSIS — M1712 Unilateral primary osteoarthritis, left knee: Secondary | ICD-10-CM | POA: Diagnosis not present

## 2016-09-06 DIAGNOSIS — R200 Anesthesia of skin: Secondary | ICD-10-CM | POA: Insufficient documentation

## 2016-09-06 DIAGNOSIS — M4726 Other spondylosis with radiculopathy, lumbar region: Secondary | ICD-10-CM | POA: Diagnosis not present

## 2016-09-06 DIAGNOSIS — K219 Gastro-esophageal reflux disease without esophagitis: Secondary | ICD-10-CM | POA: Diagnosis not present

## 2016-09-06 DIAGNOSIS — H3530 Unspecified macular degeneration: Secondary | ICD-10-CM | POA: Insufficient documentation

## 2016-09-06 DIAGNOSIS — E785 Hyperlipidemia, unspecified: Secondary | ICD-10-CM | POA: Diagnosis not present

## 2016-09-06 DIAGNOSIS — Z87891 Personal history of nicotine dependence: Secondary | ICD-10-CM | POA: Insufficient documentation

## 2016-09-06 NOTE — Patient Instructions (Signed)
PLEASE FEEL FREE TO CALL OUR OFFICE WITH ANY PROBLEMS OR QUESTIONS ([phone])      

## 2016-09-06 NOTE — Progress Notes (Signed)
Subjective:    Patient ID: Kelsey Sparks, female    DOB: Jan 05, 1946, 71 y.o.   MRN: [id number]  HPI   Kelsey Sparks is here in regard to her chronic pain. She has been fairly steady since our last visit. Her right knee has gradually worsened since we performed the injection in March. She is having some right arm pain which may or not be related to her right shoulder.  She is walking regularly in the gym and uses the eliptical as well. 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   For pain she is using tramadol for pain as well as mobic and voltaren gel.   Pain Inventory Average Pain 9 Pain Right Now 8 My pain is sharp and aching  In the last 24 hours, has pain interfered with the following? General activity 4 Relation with others 0 Enjoyment of life 7 What TIME of day is your pain at its worst? evening Sleep (in general) Good  Pain is worse with: bending, inactivity, standing and some activites Pain improves with: rest, heat/ice and injections Relief from Meds: 9  Mobility walk without assistance ability to climb steps?  yes do you drive?  yes  Function retired  Neuro/Psych numbness spasms dizziness  Prior Studies Any changes since last visit?  no  Physicians involved in your care Any changes since last visit?  no   Family History  Problem Relation Age of Onset  . Arthritis Father   . Hypertension Father   . Arthritis Mother   . Asthma Mother   . Breast cancer Maternal Aunt   . Colon cancer Neg Hx    Social History   Social History  . Marital status: Married    Spouse name: N/A  . Number of children: N/A  . Years of education: N/A   Social History Main Topics  . Smoking status: Former Smoker    Packs/day: 0.50    Years: 25.00    Quit date: 03/27/2008  . Smokeless tobacco: Never Used  . Alcohol use No  . Drug use: No  . Sexual activity: Not on file   Other Topics Concern  . Not on file   Social History Narrative  . No narrative  on file   Past Surgical History:  Procedure Laterality Date  . BACK SURGERY    . CHOLECYSTECTOMY    . COLONOSCOPY    . KNEE SURGERY    . LUMBAR DISC SURGERY    . NECK SURGERY    . POLYPECTOMY    . TOTAL ABDOMINAL HYSTERECTOMY W/ BILATERAL SALPINGOOPHORECTOMY    . UPPER GASTROINTESTINAL ENDOSCOPY     Past Medical History:  Diagnosis Date  . Adenomatous colon polyp   . Allergy   . Anemia   . Arthritis   . Esophageal stricture   . GERD (gastroesophageal reflux disease)   . Hiatal hernia   . Hyperlipidemia   . Hypertension   . IBS (irritable bowel syndrome)   . Low back pain   . Macular degeneration   . Muscular degeneration   . Peripheral vascular disease ([place])    There were no vitals taken for this visit.  Opioid Risk Score:   Fall Risk Score:  `1  Depression screen PHQ 2/9  Depression screen [facility] 2/9 03/15/2016 11/24/2015 02/24/2015 10/27/2014 10/06/2013  Decreased Interest 0 0 2 0 0  Down, Depressed, Hopeless 0 0 0 0 -  PHQ - 2 Score 0 0 2 0 0  Altered  sleeping - - 1 - -  Tired, decreased energy - - 2 - -  Change in appetite - - 0 - -  Feeling bad or failure about yourself  - - 0 - -  Trouble concentrating - - 0 - -  Moving slowly or fidgety/restless - - 1 - -  Suicidal thoughts - - 0 - -  PHQ-9 Score - - 6 - -  Difficult doing work/chores - - Not difficult at all - -    Review of Systems  Constitutional: Positive for diaphoresis and unexpected weight change.  HENT: Negative.   Eyes: Negative.   Respiratory: Positive for shortness of breath.   Cardiovascular: Negative.   Gastrointestinal: Negative.   Endocrine: Negative.   Genitourinary: Negative.   Musculoskeletal: Negative.   Skin: Negative.   Allergic/Immunologic: Negative.   Neurological: Negative.   Hematological: Negative.   Psychiatric/Behavioral: Negative.   All other systems reviewed and are negative.      Objective:   Physical Exam Constitutional: She is oriented to person, place, and  time. She appears well-developed and well-nourished.  HENT:  Head: Normocephalic and atraumatic.  Neck: Normal range of motion. Neck supple.  Cardiovascular: Normal rate and regular rhythm.   Pulmonary/Chest: Effort normal and breath sounds normal.  Musculoskeletal:  Right knee with creptius more than left knee. Mild joint effusion right knee. Antalgia with gait on right LB TTP.  Neurological: a and o x 3.  Skin: Skin is warm and dry.  Psychiatric: She has a normal mood and affect.           Assessment & Plan:  1. 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   -After informed consent and preparation of the skin with betadine and isopropyl alcohol, I injected 6mg  (1cc) of celestone and 4cc of 1% lidocaine into the right knee via lateral approach. Additionally, aspiration was performed prior to injection. The patient tolerated well, and no complications were encountered. Afterward the area was cleaned and dressed. Post- injection instructions were provided.   -mobic and voltaren gel  -tramadol  -consider synvisc injection if another injection is required 2. Right Lumbar Radiculitis: Continue Gabapentin  3. Pain Management: Continue Tramadol and HEP  15 minutes of face to face patient care time was spent during this visit. All questions were encouraged and answered.

## 2016-09-18 ENCOUNTER — Telehealth: Payer: Self-pay | Admitting: Adult Health

## 2016-09-18 NOTE — Telephone Encounter (Signed)
Pt needs new rx simvastatin 40 mg #90 w/refills  send to [facility]

## 2016-09-19 ENCOUNTER — Other Ambulatory Visit: Payer: Self-pay

## 2016-09-19 DIAGNOSIS — E785 Hyperlipidemia, unspecified: Secondary | ICD-10-CM

## 2016-09-19 MED ORDER — SIMVASTATIN 40 MG PO TABS: 40.0000 mg | ORAL_TABLET | Freq: Every day | ORAL | 1 refills | Status: DC

## 2016-09-19 NOTE — Telephone Encounter (Signed)
Rx has been sent in as directed.  

## 2016-09-26 ENCOUNTER — Ambulatory Visit (INDEPENDENT_AMBULATORY_CARE_PROVIDER_SITE_OTHER): Payer: Medicare HMO | Admitting: Adult Health

## 2016-09-26 ENCOUNTER — Encounter: Payer: Self-pay | Admitting: Adult Health

## 2016-09-26 DIAGNOSIS — K644 Residual hemorrhoidal skin tags: Secondary | ICD-10-CM | POA: Diagnosis not present

## 2016-09-26 MED ORDER — HYDROCORTISONE ACETATE 25 MG RE SUPP: 25.0000 mg | Freq: Two times a day (BID) | RECTAL | 6 refills | Status: AC | PRN

## 2016-09-26 NOTE — Progress Notes (Signed)
Subjective:    Patient ID: Kelsey Sparks, female    DOB: 03/30/1945, 71 y.o.   MRN: [id number]  HPI  71 year old female who  has a past medical history of Adenomatous colon polyp; Allergy; Anemia; Arthritis; Esophageal stricture; GERD (gastroesophageal reflux disease); Hiatal hernia; Hyperlipidemia; Hypertension; IBS (irritable bowel syndrome); Low back pain; Macular degeneration; Muscular degeneration; and Peripheral vascular disease ([place]).   She reports to the office today for one day of bright red blood per rectum. She had a bowel movement last night and when she wiped she had bright red blood on the toilet paper. She has had a bowel movement today and did not notice any blood.   She does report mild pain in her left lower quadrant with bloating and "feels constipated. I have to strain lately to have a bowel movement."   She denies any pain or itching    Review of Systems See HPI   Past Medical History:  Diagnosis Date  . Adenomatous colon polyp   . Allergy   . Anemia   . Arthritis   . Esophageal stricture   . GERD (gastroesophageal reflux disease)   . Hiatal hernia   . Hyperlipidemia   . Hypertension   . IBS (irritable bowel syndrome)   . Low back pain   . Macular degeneration   . Muscular degeneration   . Peripheral vascular disease [facility])     Social History   Social History  . Marital status: Married    Spouse name: N/A  . Number of children: N/A  . Years of education: N/A   Occupational History  . Not on file.   Social History Main Topics  . Smoking status: Former Smoker    Packs/day: 0.50    Years: 25.00    Quit date: 03/27/2008  . Smokeless tobacco: Never Used  . Alcohol use No  . Drug use: No  . Sexual activity: Not on file   Other Topics Concern  . Not on file   Social History Narrative  . No narrative on file    Past Surgical History:  Procedure Laterality Date  . BACK SURGERY    . CHOLECYSTECTOMY    . COLONOSCOPY    . KNEE SURGERY     . LUMBAR DISC SURGERY    . NECK SURGERY    . POLYPECTOMY    . TOTAL ABDOMINAL HYSTERECTOMY W/ BILATERAL SALPINGOOPHORECTOMY    . UPPER GASTROINTESTINAL ENDOSCOPY      Family History  Problem Relation Age of Onset  . Arthritis Father   . Hypertension Father   . Arthritis Mother   . Asthma Mother   . Breast cancer Maternal Aunt   . Colon cancer Neg Hx     No Known Allergies  Current Outpatient Prescriptions on File Prior to Visit  Medication Sig Dispense Refill  . aspirin 81 MG tablet Take 81 mg by mouth daily.    Marland Kitchen atenolol (TENORMIN) 50 MG tablet Take 1 tablet (50 mg total) by mouth every morning. 100 tablet 3  . baclofen (LIORESAL) 10 MG tablet Take 10 mg by mouth 2 (two) times daily.    . cetirizine (ZYRTEC) 10 MG tablet Take 10 mg by mouth daily.    . cyclobenzaprine (FLEXERIL) 10 MG tablet take 1 tablet by mouth at bedtime if needed 90 tablet 1  . diclofenac sodium (VOLTAREN) 1 % GEL Apply 1 application topically 3 (three) times daily. Right knee 3 Tube 4  . dicyclomine (BENTYL)  10 MG capsule TAKE 1 CAPSULE THREE TIMES DAILY BEFORE MEALS 270 capsule 3  . fluticasone (FLONASE) 50 MCG/ACT nasal spray instill 2 sprays into each nostril once daily 48 g 3  . gabapentin (NEURONTIN) 100 MG capsule Take 1 capsule (100 mg total) by mouth 3 (three) times daily. 90 capsule 3  . meclizine (ANTIVERT) 25 MG tablet TAKE 1/2 TO 1 TABLET EVERY 6 HOURS IF NEEDED 40 tablet 0  . meloxicam (MOBIC) 15 MG tablet TAKE 1 TABLET (15 MG TOTAL) BY MOUTH DAILY. 90 tablet 3  . Polyethyl Glycol-Propyl Glycol (SYSTANE) 0.4-0.3 % SOLN Apply to eye.    . RABEprazole (ACIPHEX) 20 MG tablet Take 1 tablet (20 mg total) by mouth daily. 90 tablet 1  . simvastatin (ZOCOR) 40 MG tablet Take 1 tablet (40 mg total) by mouth daily. 90 tablet 1  . traMADol (ULTRAM) 50 MG tablet take 1-2 tablets by mouth every 8 hours if needed 135 tablet 1  . triamterene-hydrochlorothiazide (MAXZIDE-25) 37.5-25 MG tablet take 1 tablet  by mouth every morning 90 tablet 3  . zonisamide (ZONEGRAN) 25 MG capsule Take 50 mg by mouth daily. Increase as directed  0  . [DISCONTINUED] ferrous sulfate 325 (65 FE) MG tablet Take 325 mg by mouth daily with breakfast.       No current facility-administered medications on file prior to visit.     BP 122/68 (BP Location: Left Arm, Patient Position: Sitting, Cuff Size: Normal)   Pulse 80   Temp 98.2 F (36.8 C) (Oral)   Ht 5\' 3"  (1.6 m)   Wt 157 lb 6.4 oz (71.4 kg)   SpO2 97%   BMI 27.88 kg/m       Objective:   Physical Exam  Constitutional: She is oriented to person, place, and time. She appears well-developed and well-nourished. No distress.  Cardiovascular: Normal rate, regular rhythm, normal heart sounds and intact distal pulses.  Exam reveals no gallop and no friction rub.   No murmur heard. Pulmonary/Chest: Effort normal and breath sounds normal. No respiratory distress. She has no wheezes. She has no rales. She exhibits no tenderness.  Abdominal: Soft. Bowel sounds are normal. She exhibits no distension and no mass. There is no tenderness. There is no rebound and no guarding.  Genitourinary: Rectal exam shows external hemorrhoid. Rectal exam shows no internal hemorrhoid, no tenderness, anal tone normal and guaiac negative stool.  Neurological: She is alert and oriented to person, place, and time.  Skin: Skin is warm and dry. No rash noted. She is not diaphoretic. No erythema. No pallor.  Psychiatric: She has a normal mood and affect. Her behavior is normal. Judgment and thought content normal.  Nursing note and vitals reviewed.     Assessment & Plan:  1. External hemorrhoid - Blood appears to be from external hemorrhoid. Advised to increase water consumption and add fiber supplement such as Benifiber.  - Follow up if no improvement  - hydrocortisone (ANUSOL-HC) 25 MG suppository; Place 1 suppository (25 mg total) rectally 2 (two) times daily as needed for hemorrhoids or  itching.  Dispense: 30 suppository; Refill: 6  Dorothyann Peng, NP

## 2016-10-11 ENCOUNTER — Other Ambulatory Visit: Payer: Self-pay | Admitting: Adult Health

## 2016-10-20 ENCOUNTER — Other Ambulatory Visit: Payer: Self-pay | Admitting: Family Medicine

## 2016-10-20 MED ORDER — TRIAMTERENE-HCTZ 37.5-25 MG PO TABS: 1.0000 | ORAL_TABLET | Freq: Every morning | ORAL | 0 refills | Status: DC

## 2016-10-20 NOTE — Telephone Encounter (Signed)
Received fax from [employer].  Called and spoke to the pt to see if she wanted medication sent to mail order or to local pharmacy.  Pt stated mail order is cheaper.  Sent to the pharmacy by e-scribe for 90 days.  I scheduled pt to come in for yearly and lab work on 12/12/16 @ 9:30 AM.  Notified pt to come fasting.

## 2016-10-23 ENCOUNTER — Other Ambulatory Visit: Payer: Self-pay

## 2016-10-23 ENCOUNTER — Other Ambulatory Visit: Payer: Self-pay | Admitting: *Deleted

## 2016-10-23 DIAGNOSIS — I10 Essential (primary) hypertension: Secondary | ICD-10-CM

## 2016-10-23 DIAGNOSIS — M1732 Unilateral post-traumatic osteoarthritis, left knee: Secondary | ICD-10-CM

## 2016-10-23 DIAGNOSIS — M5416 Radiculopathy, lumbar region: Secondary | ICD-10-CM

## 2016-10-23 DIAGNOSIS — M1712 Unilateral primary osteoarthritis, left knee: Secondary | ICD-10-CM

## 2016-10-23 DIAGNOSIS — M4716 Other spondylosis with myelopathy, lumbar region: Secondary | ICD-10-CM

## 2016-10-23 DIAGNOSIS — M19041 Primary osteoarthritis, right hand: Secondary | ICD-10-CM

## 2016-10-23 DIAGNOSIS — M1711 Unilateral primary osteoarthritis, right knee: Secondary | ICD-10-CM

## 2016-10-23 DIAGNOSIS — M19242 Secondary osteoarthritis, left hand: Secondary | ICD-10-CM

## 2016-10-23 DIAGNOSIS — M19042 Primary osteoarthritis, left hand: Secondary | ICD-10-CM

## 2016-10-23 DIAGNOSIS — M19241 Secondary osteoarthritis, right hand: Secondary | ICD-10-CM

## 2016-10-23 MED ORDER — ATENOLOL 50 MG PO TABS: 50.0000 mg | ORAL_TABLET | Freq: Every morning | ORAL | 3 refills | Status: DC

## 2016-10-23 MED ORDER — MELOXICAM 15 MG PO TABS: 15.0000 mg | ORAL_TABLET | Freq: Every day | ORAL | 0 refills | Status: DC

## 2016-10-23 MED ORDER — GABAPENTIN 100 MG PO CAPS: 100.0000 mg | ORAL_CAPSULE | Freq: Three times a day (TID) | ORAL | 0 refills | Status: DC

## 2016-10-23 MED ORDER — TRAMADOL HCL 50 MG PO TABS: ORAL_TABLET | ORAL | 1 refills | Status: DC

## 2016-10-23 MED ORDER — RABEPRAZOLE SODIUM 20 MG PO TBEC: 20.0000 mg | DELAYED_RELEASE_TABLET | Freq: Every day | ORAL | 1 refills | Status: DC

## 2016-10-28 ENCOUNTER — Other Ambulatory Visit: Payer: Self-pay | Admitting: Adult Health

## 2016-10-28 DIAGNOSIS — I10 Essential (primary) hypertension: Secondary | ICD-10-CM

## 2016-11-01 DIAGNOSIS — M542 Cervicalgia: Secondary | ICD-10-CM | POA: Diagnosis not present

## 2016-11-01 DIAGNOSIS — M791 Myalgia: Secondary | ICD-10-CM | POA: Diagnosis not present

## 2016-11-01 DIAGNOSIS — R51 Headache: Secondary | ICD-10-CM | POA: Diagnosis not present

## 2016-11-01 DIAGNOSIS — G518 Other disorders of facial nerve: Secondary | ICD-10-CM | POA: Diagnosis not present

## 2016-11-01 DIAGNOSIS — G43019 Migraine without aura, intractable, without status migrainosus: Secondary | ICD-10-CM | POA: Diagnosis not present

## 2016-11-06 ENCOUNTER — Encounter: Payer: Self-pay | Admitting: Registered Nurse

## 2016-11-06 ENCOUNTER — Telehealth: Payer: Self-pay | Admitting: Registered Nurse

## 2016-11-06 ENCOUNTER — Encounter: Payer: Medicare HMO | Attending: Physical Medicine & Rehabilitation | Admitting: Registered Nurse

## 2016-11-06 VITALS — BP 122/77 | HR 74

## 2016-11-06 DIAGNOSIS — G8929 Other chronic pain: Secondary | ICD-10-CM | POA: Insufficient documentation

## 2016-11-06 DIAGNOSIS — K589 Irritable bowel syndrome without diarrhea: Secondary | ICD-10-CM | POA: Insufficient documentation

## 2016-11-06 DIAGNOSIS — R42 Dizziness and giddiness: Secondary | ICD-10-CM | POA: Diagnosis not present

## 2016-11-06 DIAGNOSIS — I739 Peripheral vascular disease, unspecified: Secondary | ICD-10-CM | POA: Diagnosis not present

## 2016-11-06 DIAGNOSIS — M1712 Unilateral primary osteoarthritis, left knee: Secondary | ICD-10-CM | POA: Diagnosis not present

## 2016-11-06 DIAGNOSIS — G894 Chronic pain syndrome: Secondary | ICD-10-CM

## 2016-11-06 DIAGNOSIS — M25512 Pain in left shoulder: Secondary | ICD-10-CM | POA: Insufficient documentation

## 2016-11-06 DIAGNOSIS — K219 Gastro-esophageal reflux disease without esophagitis: Secondary | ICD-10-CM | POA: Insufficient documentation

## 2016-11-06 DIAGNOSIS — R200 Anesthesia of skin: Secondary | ICD-10-CM | POA: Diagnosis not present

## 2016-11-06 DIAGNOSIS — Z79899 Other long term (current) drug therapy: Secondary | ICD-10-CM | POA: Diagnosis not present

## 2016-11-06 DIAGNOSIS — M1711 Unilateral primary osteoarthritis, right knee: Secondary | ICD-10-CM | POA: Diagnosis not present

## 2016-11-06 DIAGNOSIS — E785 Hyperlipidemia, unspecified: Secondary | ICD-10-CM | POA: Insufficient documentation

## 2016-11-06 DIAGNOSIS — M19042 Primary osteoarthritis, left hand: Secondary | ICD-10-CM | POA: Insufficient documentation

## 2016-11-06 DIAGNOSIS — R202 Paresthesia of skin: Secondary | ICD-10-CM | POA: Insufficient documentation

## 2016-11-06 DIAGNOSIS — M7581 Other shoulder lesions, right shoulder: Secondary | ICD-10-CM | POA: Insufficient documentation

## 2016-11-06 DIAGNOSIS — M19011 Primary osteoarthritis, right shoulder: Secondary | ICD-10-CM | POA: Insufficient documentation

## 2016-11-06 DIAGNOSIS — Z87891 Personal history of nicotine dependence: Secondary | ICD-10-CM | POA: Insufficient documentation

## 2016-11-06 DIAGNOSIS — Z5181 Encounter for therapeutic drug level monitoring: Secondary | ICD-10-CM | POA: Diagnosis not present

## 2016-11-06 DIAGNOSIS — M19041 Primary osteoarthritis, right hand: Secondary | ICD-10-CM | POA: Insufficient documentation

## 2016-11-06 DIAGNOSIS — I10 Essential (primary) hypertension: Secondary | ICD-10-CM | POA: Diagnosis not present

## 2016-11-06 DIAGNOSIS — M961 Postlaminectomy syndrome, not elsewhere classified: Secondary | ICD-10-CM | POA: Insufficient documentation

## 2016-11-06 DIAGNOSIS — M4726 Other spondylosis with radiculopathy, lumbar region: Secondary | ICD-10-CM | POA: Insufficient documentation

## 2016-11-06 DIAGNOSIS — H3530 Unspecified macular degeneration: Secondary | ICD-10-CM | POA: Diagnosis not present

## 2016-11-06 DIAGNOSIS — K449 Diaphragmatic hernia without obstruction or gangrene: Secondary | ICD-10-CM | POA: Diagnosis not present

## 2016-11-06 NOTE — Progress Notes (Signed)
Subjective:    Patient ID: Kelsey Sparks, female    DOB: Dec 26, 1945, 71 y.o.   MRN: [id number]  HPI: Kelsey Sparks is a 71year old female who returns for follow up appointment for chronic pain and medication refill. She states her pain is usually located in her right knee.She rated her pain 8 yesterday, she reports being pain free at this time. Her current exercise regime is walking and attending  [employer] three times a week.   Pain Inventory Average Pain 6 Pain Right Now 8 My pain is sharp and aching  In the last 24 hours, has pain interfered with the following? General activity 4 Relation with others 0 Enjoyment of life 1 What TIME of day is your pain at its worst? morning and evening Sleep (in general) NA  Pain is worse with: walking, bending, standing and some activites Pain improves with: rest, heat/ice, medication and injections Relief from Meds: 9  Mobility how many minutes can you walk? 3 ability to climb steps?  yes do you drive?  yes  Function retired  Neuro/Psych numbness dizziness  Prior Studies Any changes since last visit?  no  Physicians involved in your care Any changes since last visit?  no   Family History  Problem Relation Age of Onset  . Arthritis Father   . Hypertension Father   . Arthritis Mother   . Asthma Mother   . Breast cancer Maternal Aunt   . Colon cancer Neg Hx    Social History   Social History  . Marital status: Married    Spouse name: N/A  . Number of children: N/A  . Years of education: N/A   Social History Main Topics  . Smoking status: Former Smoker    Packs/day: 0.50    Years: 25.00    Quit date: 03/27/2008  . Smokeless tobacco: Never Used  . Alcohol use No  . Drug use: No  . Sexual activity: Not Asked   Other Topics Concern  . None   Social History Narrative  . None   Past Surgical History:  Procedure Laterality Date  . BACK SURGERY    . CHOLECYSTECTOMY    . COLONOSCOPY    .  KNEE SURGERY    . LUMBAR DISC SURGERY    . NECK SURGERY    . POLYPECTOMY    . TOTAL ABDOMINAL HYSTERECTOMY W/ BILATERAL SALPINGOOPHORECTOMY    . UPPER GASTROINTESTINAL ENDOSCOPY     Past Medical History:  Diagnosis Date  . Adenomatous colon polyp   . Allergy   . Anemia   . Arthritis   . Esophageal stricture   . GERD (gastroesophageal reflux disease)   . Hiatal hernia   . Hyperlipidemia   . Hypertension   . IBS (irritable bowel syndrome)   . Low back pain   . Macular degeneration   . Muscular degeneration   . Peripheral vascular disease ([facility])    BP 122/77   Pulse 74   SpO2 97%   Opioid Risk Score:   Fall Risk Score:  `1  Depression screen PHQ 2/9  Depression screen [facility] 2/9 11/06/2016 03/15/2016 11/24/2015 02/24/2015 10/27/2014 10/06/2013  Decreased Interest 0 0 0 2 0 0  Down, Depressed, Hopeless 0 0 0 0 0 -  PHQ - 2 Score 0 0 0 2 0 0  Altered sleeping - - - 1 - -  Tired, decreased energy - - - 2 - -  Change in appetite - - -  0 - -  Feeling bad or failure about yourself  - - - 0 - -  Trouble concentrating - - - 0 - -  Moving slowly or fidgety/restless - - - 1 - -  Suicidal thoughts - - - 0 - -  PHQ-9 Score - - - 6 - -  Difficult doing work/chores - - - Not difficult at all - -     Review of Systems  Constitutional: Positive for diaphoresis and unexpected weight change.  HENT: Negative.   Eyes: Negative.   Respiratory: Negative.   Cardiovascular: Positive for leg swelling.  Gastrointestinal: Positive for constipation.  Endocrine: Negative.   Genitourinary: Negative.   Musculoskeletal: Negative.   Skin: Negative.   Allergic/Immunologic: Negative.   Neurological: Positive for dizziness and numbness.  Hematological: Negative.   Psychiatric/Behavioral: Negative.   All other systems reviewed and are negative.      Objective:   Physical Exam  Constitutional: She is oriented to person, place, and time. She appears well-developed and well-nourished.  HENT:    Head: Normocephalic and atraumatic.  Neck: Normal range of motion. Neck supple.  Cardiovascular: Normal rate and regular rhythm.   Pulmonary/Chest: Effort normal and breath sounds normal.  Musculoskeletal:  Normal Muscle Bulk and Muscle Testing Reveals:  Upper Extremities: Full ROM and Muscle Strength 5/5 Lower Extremities: Full ROM and Muscle Strength 5/5 Arises from Table with ease Narrow Based Gait  Neurological: She is alert and oriented to person, place, and time.  Skin: Skin is warm and dry.  Psychiatric: She has a normal mood and affect.  Nursing note and vitals reviewed.         Assessment & Plan:  1. 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 S/P Right Knee Cortisone Injection on 06/06/2016.  Continue Mobic and Voltaren Gel. 11/06/2016 2. Right Lumbar Radiculitis:Continue to Monitor. Continue Gabapentin. 11/06/16 3. Pain Management: Continue Tramadol and HEP. 11/06/2016  15 minutes of face to face patient care time was spent during this visit. All questions were encouraged and answered.  F/U in 1 month

## 2016-11-06 NOTE — Telephone Encounter (Signed)
On 11/06/2016 the [place] was reviewed no conflict was seen on the [place] with multiple prescribers.  If there were any discrepancies this would have been reported to her physician.

## 2016-11-08 ENCOUNTER — Encounter: Payer: Self-pay | Admitting: Podiatry

## 2016-11-08 ENCOUNTER — Ambulatory Visit (INDEPENDENT_AMBULATORY_CARE_PROVIDER_SITE_OTHER): Payer: Medicare HMO | Admitting: Podiatry

## 2016-11-08 DIAGNOSIS — M79676 Pain in unspecified toe(s): Secondary | ICD-10-CM

## 2016-11-08 DIAGNOSIS — B351 Tinea unguium: Secondary | ICD-10-CM

## 2016-11-08 NOTE — Progress Notes (Signed)
Patient ID: Kelsey Sparks, female   DOB: Aug 09, 1945, 71 y.o.   MRN: [id number] Complaint:  Visit Type: Patient returns to my office for continued preventative foot care services. Complaint: Patient states" my nails have grown long and thick and become painful to walk and wear shoes" . The patient presents for preventative foot care services. No changes to ROS  Podiatric Exam: Vascular: dorsalis pedis and posterior tibial pulses are palpable bilateral. Capillary return is immediate. Temperature gradient is WNL. Skin turgor WNL  Sensorium: Normal Semmes Weinstein monofilament test. Normal tactile sensation bilaterally. Nail Exam: Pt has thick disfigured discolored nails with subungual debris noted bilateral entire nail hallux through fifth toenails with the exception fourth toenail left foot. Ulcer Exam: There is no evidence of ulcer or pre-ulcerative changes or infection. Orthopedic Exam: Muscle tone and strength are WNL. No limitations in general ROM. No crepitus or effusions noted. Foot type and digits show no abnormalities. HAV  B/L. Skin: No Porokeratosis. No infection or ulcers  Diagnosis:  Onychomycosis, , Pain in right toe, pain in left toes  Treatment & Plan Procedures and Treatment: Consent by patient was obtained for treatment procedures. The patient understood the discussion of treatment and procedures well. All questions were answered thoroughly reviewed. Debridement of mycotic and hypertrophic toenails, 1 through 5 bilateral and clearing of subungual debris. No ulceration, no infection noted.  Return Visit-Office Procedure: Patient instructed to return to the office for a follow up visit 3 months for continued evaluation and treatment.    Gardiner Barefoot DPM

## 2016-11-24 ENCOUNTER — Telehealth: Payer: Self-pay

## 2016-11-24 NOTE — Telephone Encounter (Signed)
Patient has to try and fail Omeprazole and Pantoprazole before Rabeprazole will be covered.

## 2016-11-28 NOTE — Telephone Encounter (Signed)
Ok to try Omeprazole 20 mg daily x 90 + 1

## 2016-11-29 DIAGNOSIS — R69 Illness, unspecified: Secondary | ICD-10-CM | POA: Diagnosis not present

## 2016-11-29 NOTE — Telephone Encounter (Signed)
Kelsey Sparks pt returned your call °

## 2016-11-29 NOTE — Telephone Encounter (Signed)
Left a message for a return call.

## 2016-11-30 MED ORDER — OMEPRAZOLE 20 MG PO CPDR: 20.0000 mg | DELAYED_RELEASE_CAPSULE | Freq: Every day | ORAL | 1 refills | Status: DC

## 2016-11-30 NOTE — Telephone Encounter (Signed)
Spoke to the pt and informed her that Aciphex is not covered by her insurance.  Sent in omeprazole.  Pt to pick up.

## 2016-11-30 NOTE — Addendum Note (Signed)
Addended by: Miles Costain T on: 11/30/2016 10:50 AM   Modules accepted: Orders

## 2016-12-12 NOTE — Progress Notes (Signed)
Subjective:    Patient ID: Kelsey Sparks, female    DOB: 10-25-1945, 71 y.o.   MRN: [id number]  HPI   Patient presents for yearly preventative medicine examination. She is a pleasant 71 year old female who  has a past medical history of Adenomatous colon polyp; Allergy; Anemia; Arthritis; Esophageal stricture; GERD (gastroesophageal reflux disease); Hiatal hernia; Hyperlipidemia; Hypertension; IBS (irritable bowel syndrome); Low back pain; Macular degeneration; Muscular degeneration; and Peripheral vascular disease ([place]).  She takes Maxide 37.5 mg - 25 mg and Atenolol 50 mg for hypertension   She takes Prilosec 20 mg for GERD   Due to history of hyperlipidemia, she takes Zocor 40 mg   Due to chronic pain she takes Gabapentin and Tramadol PRN  - She is seen by Dr. Naaman Plummer with [facility]   All immunizations and health maintenance protocols were reviewed with the patient and needed orders were placed. She is up to date on all vaccinations   Appropriate screening laboratory values were ordered for the patient including screening of hyperlipidemia, renal function and hepatic function.  Medication reconciliation,  past medical history, social history, problem list and allergies were reviewed in detail with the patient  Goals were established with regard to weight loss, exercise, and  diet in compliance with medications. She goes to the gym 3 times a week to walk and use the elliptical   End of life planning was discussed. She has an advanced directive and living will.   Her last colonoscopy was in 2015, it was recommended that she have a repeat in 3 years do to history of polyps, she is due in December. Her last bone density screen was in 07/2016 which showed osteopenia, she also had a normal mammogram at this time. She does do home breast exams and reports not noticing any changes in her breasts.   She denies any acute complaints. States " I am actually feeling pretty  good"   Review of Systems  Constitutional: Negative.   HENT: Negative.   Eyes: Negative.   Respiratory: Negative.   Cardiovascular: Negative.   Gastrointestinal: Negative.   Endocrine: Negative.   Genitourinary: Negative.   Musculoskeletal: Positive for arthralgias and back pain.  Skin: Negative.   Allergic/Immunologic: Negative.   Neurological: Negative.   Hematological: Negative.   Psychiatric/Behavioral: Negative.   All other systems reviewed and are negative.   Past Medical History:  Diagnosis Date  . Adenomatous colon polyp   . Allergy   . Anemia   . Arthritis   . Esophageal stricture   . GERD (gastroesophageal reflux disease)   . Hiatal hernia   . Hyperlipidemia   . Hypertension   . IBS (irritable bowel syndrome)   . Low back pain   . Macular degeneration   . Muscular degeneration   . Peripheral vascular disease [facility])     Social History   Social History  . Marital status: Married    Spouse name: N/A  . Number of children: N/A  . Years of education: N/A   Occupational History  . Not on file.   Social History Main Topics  . Smoking status: Former Smoker    Packs/day: 0.50    Years: 25.00    Quit date: 03/27/2008  . Smokeless tobacco: Never Used  . Alcohol use No  . Drug use: No  . Sexual activity: Not on file   Other Topics Concern  . Not on file   Social History Narrative  . No  narrative on file    Past Surgical History:  Procedure Laterality Date  . BACK SURGERY    . CHOLECYSTECTOMY    . COLONOSCOPY    . KNEE SURGERY    . LUMBAR DISC SURGERY    . NECK SURGERY    . POLYPECTOMY    . TOTAL ABDOMINAL HYSTERECTOMY W/ BILATERAL SALPINGOOPHORECTOMY    . UPPER GASTROINTESTINAL ENDOSCOPY      Family History  Problem Relation Age of Onset  . Arthritis Father   . Hypertension Father   . Arthritis Mother   . Asthma Mother   . Breast cancer Maternal Aunt   . Colon cancer Neg Hx     No Known Allergies  Current Outpatient Prescriptions on  File Prior to Visit  Medication Sig Dispense Refill  . aspirin 81 MG tablet Take 81 mg by mouth daily.    Marland Kitchen atenolol (TENORMIN) 50 MG tablet Take 1 tablet (50 mg total) by mouth every morning. 100 tablet 3  . baclofen (LIORESAL) 10 MG tablet Take 10 mg by mouth 2 (two) times daily.    . cetirizine (ZYRTEC) 10 MG tablet Take 10 mg by mouth daily.    . cyclobenzaprine (FLEXERIL) 10 MG tablet take 1 tablet by mouth at bedtime if needed 90 tablet 1  . diclofenac sodium (VOLTAREN) 1 % GEL Apply 1 application topically 3 (three) times daily. Right knee 3 Tube 4  . dicyclomine (BENTYL) 10 MG capsule TAKE 1 CAPSULE THREE TIMES DAILY BEFORE MEALS 270 capsule 3  . fluticasone (FLONASE) 50 MCG/ACT nasal spray instill 2 sprays into each nostril once daily 48 g 3  . gabapentin (NEURONTIN) 100 MG capsule Take 1 capsule (100 mg total) by mouth 3 (three) times daily. 270 capsule 0  . hydrocortisone (ANUSOL-HC) 25 MG suppository Place 1 suppository (25 mg total) rectally 2 (two) times daily as needed for hemorrhoids or itching. 30 suppository 6  . meclizine (ANTIVERT) 25 MG tablet TAKE 1/2 TO 1 TABLET EVERY 6 HOURS IF NEEDED 40 tablet 0  . meloxicam (MOBIC) 15 MG tablet Take 1 tablet (15 mg total) by mouth daily. 90 tablet 0  . omeprazole (PRILOSEC) 20 MG capsule Take 1 capsule (20 mg total) by mouth daily. 90 capsule 1  . Polyethyl Glycol-Propyl Glycol (SYSTANE) 0.4-0.3 % SOLN Apply to eye.    . simvastatin (ZOCOR) 40 MG tablet Take 1 tablet (40 mg total) by mouth daily. 90 tablet 1  . traMADol (ULTRAM) 50 MG tablet take 1-2 tablets by mouth every 8 hours if needed 405 tablet 1  . triamterene-hydrochlorothiazide (MAXZIDE-25) 37.5-25 MG tablet Take 1 tablet by mouth every morning. 90 tablet 0  . zonisamide (ZONEGRAN) 25 MG capsule Take 50 mg by mouth daily. Increase as directed  0  . [DISCONTINUED] ferrous sulfate 325 (65 FE) MG tablet Take 325 mg by mouth daily with breakfast.       No current  facility-administered medications on file prior to visit.     BP 120/80 (BP Location: Left Arm)   Temp 98.3 F (36.8 C) (Oral)   Ht 5\' 4"  (1.626 m)   Wt 161 lb (73 kg)   BMI 27.64 kg/m        Objective:   Physical Exam  Constitutional: She is oriented to person, place, and time. She appears well-developed and well-nourished. No distress.  HENT:  Head: Normocephalic and atraumatic.  Right Ear: External ear normal.  Left Ear: External ear normal.  Nose: Nose normal.  Mouth/Throat:  Oropharynx is clear and moist. No oropharyngeal exudate.  Eyes: Pupils are equal, round, and reactive to light. Conjunctivae and EOM are normal. Right eye exhibits no discharge. Left eye exhibits no discharge. No scleral icterus.  Neck: Normal range of motion. Neck supple. No JVD present. No tracheal tenderness present. Carotid bruit is not present. No tracheal deviation present. No thyroid mass and no thyromegaly present.  Cardiovascular: Normal rate, regular rhythm, normal heart sounds and intact distal pulses.  Exam reveals no gallop and no friction rub.   No murmur heard. Pulmonary/Chest: Effort normal and breath sounds normal. No stridor. No respiratory distress. She has no wheezes. She has no rales. She exhibits no tenderness.  Abdominal: Soft. Bowel sounds are normal. She exhibits no distension and no mass. There is no tenderness. There is no rebound and no guarding.  Genitourinary:  Genitourinary Comments: Breast Exam: No masses, lumps, dimpling, or discharge  Musculoskeletal: Normal range of motion. She exhibits no edema, tenderness or deformity.  Lymphadenopathy:    She has no cervical adenopathy.  Neurological: She is alert and oriented to person, place, and time. She displays normal reflexes. No cranial nerve deficit or sensory deficit. She exhibits normal muscle tone. Coordination normal.  Reflex Scores:      Bicep reflexes are 3+ on the right side and 3+ on the left side.      Patellar  reflexes are 2+ on the right side and 4+ on the left side. Skin: Skin is warm and dry. No rash noted. She is not diaphoretic. No erythema. No pallor.  Psychiatric: She has a normal mood and affect. Her behavior is normal. Judgment and thought content normal.  Nursing note and vitals reviewed.     Assessment & Plan:  1. Routine general medical examination at a health care facility - She is doing well. No change in medication at this time. Continue to stay active and eat healthy  - Follow up in one year or sooner if needed - advised to let me know if she does not hear from GI by November for her colonoscopy.  - Basic metabolic panel - CBC with Differential/Platelet - Hepatic function panel - Lipid panel - TSH - Hep C Antibody  2. Hyperlipidemia, unspecified hyperlipidemia type - Consider dose change in Zocor  - Basic metabolic panel - CBC with Differential/Platelet - Hepatic function panel - Lipid panel - TSH  3. Essential hypertension - Well controlled. No change in medications  - Basic metabolic panel - CBC with Differential/Platelet - Hepatic function panel - Lipid panel - TSH  4. Need for hepatitis C screening test  - Hep C Antibody  Dorothyann Peng, NP

## 2016-12-13 ENCOUNTER — Encounter: Payer: Self-pay | Admitting: Adult Health

## 2016-12-13 ENCOUNTER — Ambulatory Visit (INDEPENDENT_AMBULATORY_CARE_PROVIDER_SITE_OTHER): Payer: Medicare HMO | Admitting: Adult Health

## 2016-12-13 VITALS — BP 120/80 | Temp 98.3°F | Ht 64.0 in | Wt 161.0 lb

## 2016-12-13 DIAGNOSIS — E785 Hyperlipidemia, unspecified: Secondary | ICD-10-CM | POA: Diagnosis not present

## 2016-12-13 DIAGNOSIS — Z0000 Encounter for general adult medical examination without abnormal findings: Secondary | ICD-10-CM | POA: Diagnosis not present

## 2016-12-13 DIAGNOSIS — I10 Essential (primary) hypertension: Secondary | ICD-10-CM

## 2016-12-13 DIAGNOSIS — Z1159 Encounter for screening for other viral diseases: Secondary | ICD-10-CM | POA: Diagnosis not present

## 2016-12-13 LAB — BASIC METABOLIC PANEL
BUN: 21 mg/dL (ref 6–23)
CHLORIDE: 105 meq/L (ref 96–112)
CO2: 25 meq/L (ref 19–32)
CREATININE: 1.74 mg/dL — AB (ref 0.40–1.20)
Calcium: 9.6 mg/dL (ref 8.4–10.5)
GFR: 37.29 mL/min — ABNORMAL LOW (ref >60.00)
GLUCOSE: 87 mg/dL (ref 70–99)
Potassium: 4 mEq/L (ref 3.5–5.1)
Sodium: 139 mEq/L (ref 135–145)

## 2016-12-13 LAB — CBC WITH DIFFERENTIAL/PLATELET
BASOS PCT: 0.8 % (ref 0.0–3.0)
Basophils Absolute: 0 10*3/uL (ref 0.0–0.1)
EOS ABS: 0.4 10*3/uL (ref 0.0–0.7)
Eosinophils Relative: 5.9 % — ABNORMAL HIGH (ref 0.0–5.0)
HEMATOCRIT: 33.9 % — AB (ref 36.0–46.0)
Hemoglobin: 11.3 g/dL — ABNORMAL LOW (ref 12.0–15.0)
LYMPHS ABS: 1.2 10*3/uL (ref 0.7–4.0)
Lymphocytes Relative: 20.2 % (ref 12.0–46.0)
MCHC: 33.3 g/dL (ref 30.0–36.0)
MCV: 92.7 fl (ref 78.0–100.0)
MONO ABS: 0.4 10*3/uL (ref 0.1–1.0)
Monocytes Relative: 7.5 % (ref 3.0–12.0)
NEUTROS ABS: 4 10*3/uL (ref 1.4–7.7)
NEUTROS PCT: 65.6 % (ref 43.0–77.0)
PLATELETS: 173 10*3/uL (ref 150.0–400.0)
RBC: 3.66 Mil/uL — ABNORMAL LOW (ref 3.87–5.11)
RDW: 14 % (ref 11.5–15.5)
WBC: 6 10*3/uL (ref 4.0–10.5)

## 2016-12-13 LAB — LIPID PANEL
CHOL/HDL RATIO: 4
Cholesterol: 192 mg/dL (ref 0–200)
HDL: 51.5 mg/dL (ref >39.00)
LDL Cholesterol: 104 mg/dL — ABNORMAL HIGH (ref 0–99)
NonHDL: 140.35
TRIGLYCERIDES: 182 mg/dL — AB (ref 0.0–149.0)
VLDL: 36.4 mg/dL (ref 0.0–40.0)

## 2016-12-13 LAB — HEPATIC FUNCTION PANEL
ALT: 9 U/L (ref 0–35)
AST: 15 U/L (ref 0–37)
Albumin: 4.4 g/dL (ref 3.5–5.2)
Alkaline Phosphatase: 109 U/L (ref 39–117)
BILIRUBIN DIRECT: 0.1 mg/dL (ref 0.0–0.3)
BILIRUBIN TOTAL: 0.5 mg/dL (ref 0.2–1.2)
TOTAL PROTEIN: 7.1 g/dL (ref 6.0–8.3)

## 2016-12-13 LAB — TSH: TSH: 0.88 u[IU]/mL (ref 0.35–4.50)

## 2016-12-13 NOTE — Patient Instructions (Signed)
It was great seeing you today   I will follow up with you regarding your labs   Continue to exercise and eat healthy   Follow up with me as needed

## 2016-12-14 LAB — HEPATITIS C ANTIBODY
HEP C AB: NONREACTIVE
SIGNAL TO CUT-OFF: 0.03 (ref <1.00)

## 2016-12-20 ENCOUNTER — Other Ambulatory Visit: Payer: Self-pay | Admitting: Adult Health

## 2016-12-20 NOTE — Telephone Encounter (Signed)
Sent to the pharmacy by e-scribe. 

## 2016-12-26 DIAGNOSIS — Z6827 Body mass index (BMI) 27.0-27.9, adult: Secondary | ICD-10-CM | POA: Diagnosis not present

## 2016-12-26 DIAGNOSIS — E7800 Pure hypercholesterolemia, unspecified: Secondary | ICD-10-CM | POA: Diagnosis not present

## 2016-12-26 DIAGNOSIS — M2550 Pain in unspecified joint: Secondary | ICD-10-CM | POA: Diagnosis not present

## 2016-12-26 DIAGNOSIS — Z0000 Encounter for general adult medical examination without abnormal findings: Secondary | ICD-10-CM | POA: Diagnosis not present

## 2016-12-26 DIAGNOSIS — I10 Essential (primary) hypertension: Secondary | ICD-10-CM | POA: Diagnosis not present

## 2016-12-26 DIAGNOSIS — G629 Polyneuropathy, unspecified: Secondary | ICD-10-CM | POA: Diagnosis not present

## 2016-12-26 DIAGNOSIS — M62838 Other muscle spasm: Secondary | ICD-10-CM | POA: Diagnosis not present

## 2016-12-26 DIAGNOSIS — R42 Dizziness and giddiness: Secondary | ICD-10-CM | POA: Diagnosis not present

## 2016-12-26 DIAGNOSIS — Z791 Long term (current) use of non-steroidal anti-inflammatories (NSAID): Secondary | ICD-10-CM | POA: Diagnosis not present

## 2016-12-26 DIAGNOSIS — K219 Gastro-esophageal reflux disease without esophagitis: Secondary | ICD-10-CM | POA: Diagnosis not present

## 2017-01-07 ENCOUNTER — Other Ambulatory Visit: Payer: Self-pay | Admitting: Adult Health

## 2017-01-09 NOTE — Telephone Encounter (Signed)
Pt is no longer taking this medication.  She is now taking omeprazole.  Message sent to the pharmacy to discontinue Aciphex.

## 2017-01-10 ENCOUNTER — Other Ambulatory Visit: Payer: Self-pay | Admitting: Adult Health

## 2017-01-11 NOTE — Telephone Encounter (Signed)
Denied.  Pt is no longer taking this medication. 

## 2017-02-07 ENCOUNTER — Other Ambulatory Visit: Payer: Self-pay

## 2017-02-07 ENCOUNTER — Encounter: Payer: Medicare HMO | Attending: Physical Medicine & Rehabilitation | Admitting: Physical Medicine & Rehabilitation

## 2017-02-07 ENCOUNTER — Encounter: Payer: Self-pay | Admitting: Physical Medicine & Rehabilitation

## 2017-02-07 DIAGNOSIS — M19041 Primary osteoarthritis, right hand: Secondary | ICD-10-CM | POA: Insufficient documentation

## 2017-02-07 DIAGNOSIS — M1712 Unilateral primary osteoarthritis, left knee: Secondary | ICD-10-CM | POA: Insufficient documentation

## 2017-02-07 DIAGNOSIS — M19042 Primary osteoarthritis, left hand: Secondary | ICD-10-CM | POA: Insufficient documentation

## 2017-02-07 DIAGNOSIS — M4716 Other spondylosis with myelopathy, lumbar region: Secondary | ICD-10-CM

## 2017-02-07 DIAGNOSIS — I10 Essential (primary) hypertension: Secondary | ICD-10-CM | POA: Insufficient documentation

## 2017-02-07 DIAGNOSIS — E785 Hyperlipidemia, unspecified: Secondary | ICD-10-CM | POA: Insufficient documentation

## 2017-02-07 DIAGNOSIS — H3530 Unspecified macular degeneration: Secondary | ICD-10-CM | POA: Insufficient documentation

## 2017-02-07 DIAGNOSIS — K589 Irritable bowel syndrome without diarrhea: Secondary | ICD-10-CM | POA: Diagnosis not present

## 2017-02-07 DIAGNOSIS — I739 Peripheral vascular disease, unspecified: Secondary | ICD-10-CM | POA: Diagnosis not present

## 2017-02-07 DIAGNOSIS — M19241 Secondary osteoarthritis, right hand: Secondary | ICD-10-CM | POA: Diagnosis not present

## 2017-02-07 DIAGNOSIS — K219 Gastro-esophageal reflux disease without esophagitis: Secondary | ICD-10-CM | POA: Insufficient documentation

## 2017-02-07 DIAGNOSIS — M25512 Pain in left shoulder: Secondary | ICD-10-CM | POA: Insufficient documentation

## 2017-02-07 DIAGNOSIS — M7581 Other shoulder lesions, right shoulder: Secondary | ICD-10-CM | POA: Diagnosis not present

## 2017-02-07 DIAGNOSIS — G8929 Other chronic pain: Secondary | ICD-10-CM | POA: Insufficient documentation

## 2017-02-07 DIAGNOSIS — R202 Paresthesia of skin: Secondary | ICD-10-CM | POA: Diagnosis not present

## 2017-02-07 DIAGNOSIS — Z87891 Personal history of nicotine dependence: Secondary | ICD-10-CM | POA: Insufficient documentation

## 2017-02-07 DIAGNOSIS — M961 Postlaminectomy syndrome, not elsewhere classified: Secondary | ICD-10-CM | POA: Insufficient documentation

## 2017-02-07 DIAGNOSIS — R42 Dizziness and giddiness: Secondary | ICD-10-CM | POA: Diagnosis not present

## 2017-02-07 DIAGNOSIS — R200 Anesthesia of skin: Secondary | ICD-10-CM | POA: Insufficient documentation

## 2017-02-07 DIAGNOSIS — M5416 Radiculopathy, lumbar region: Secondary | ICD-10-CM | POA: Diagnosis not present

## 2017-02-07 DIAGNOSIS — K449 Diaphragmatic hernia without obstruction or gangrene: Secondary | ICD-10-CM | POA: Insufficient documentation

## 2017-02-07 DIAGNOSIS — M4726 Other spondylosis with radiculopathy, lumbar region: Secondary | ICD-10-CM | POA: Diagnosis not present

## 2017-02-07 DIAGNOSIS — M19011 Primary osteoarthritis, right shoulder: Secondary | ICD-10-CM | POA: Diagnosis not present

## 2017-02-07 DIAGNOSIS — M19242 Secondary osteoarthritis, left hand: Secondary | ICD-10-CM

## 2017-02-07 MED ORDER — GABAPENTIN 100 MG PO CAPS: 100.0000 mg | ORAL_CAPSULE | Freq: Three times a day (TID) | ORAL | 2 refills | Status: DC

## 2017-02-07 MED ORDER — VOLTAREN 1 % TD GEL: 2.0000 g | Freq: Four times a day (QID) | TRANSDERMAL | 4 refills | Status: DC

## 2017-02-07 MED ORDER — TRAMADOL HCL 50 MG PO TABS: ORAL_TABLET | ORAL | 1 refills | Status: DC

## 2017-02-07 NOTE — Patient Instructions (Signed)
PLEASE FEEL FREE TO CALL OUR OFFICE WITH ANY PROBLEMS OR QUESTIONS ([phone])      

## 2017-02-07 NOTE — Telephone Encounter (Signed)
Merrilee Seashore at [facility] is requesting verification on patients tramadol. He stated that the quantity is written for #405 with 1 refill, and just wanted to clarify that is correct.

## 2017-02-07 NOTE — Telephone Encounter (Signed)
Spoke with the Dr. To verify the medication amount, states that the order is correct, calling pharmacy to verify instructions.

## 2017-02-07 NOTE — Progress Notes (Signed)
Subjective:    Patient ID: Kelsey Sparks, female    DOB: 12/01/1945, 71 y.o.   MRN: [id number]  HPI   Kelsey Sparks is here today in follow-up of her chronic pain.  For the most part she has been doing fairly well.  She had good results with a right knee injection I performed in June.  Does notice that with the changes in weather and certainly the cooler weather here of late that her pain has increased.  She is using the tramadol and meloxicam that has been prescribed in addition to the Voltaren gel.  She found that the generic Voltaren gel was not as effective as the brand name type.  She is also using numerous over-the-counter creams that seem to be helpful as well.  For her neuropathic pain she continues on gabapentin with good efficacy.  She states she needs a refill of this today.  She has had some occasional swelling in her right knee.  She usually manages this with a knee sleeve and elevation of the limb.  She also utilizes ice and heat more so for when her leg is tight.  Pain Inventory Average Pain 6 Pain Right Now 7 My pain is sharp and aching  In the last 24 hours, has pain interfered with the following? General activity 5 Relation with others 0 Enjoyment of life 0 What TIME of day is your pain at its worst? morning and night Sleep (in general) Good  Pain is worse with: bending, sitting, inactivity, standing and some activites Pain improves with: rest, heat/ice, medication and injections Relief from Meds: 8  Mobility ability to climb steps?  yes do you drive?  yes  Function retired  Neuro/Psych numbness tingling dizziness  Prior Studies Any changes since last visit?  no  Physicians involved in your care Any changes since last visit?  no   Family History  Problem Relation Age of Onset  . Arthritis Father   . Hypertension Father   . Arthritis Mother   . Asthma Mother   . Breast cancer Maternal Aunt   . Colon cancer Neg Hx    Social History    Socioeconomic History  . Marital status: Married    Spouse name: Not on file  . Number of children: Not on file  . Years of education: Not on file  . Highest education level: Not on file  Social Needs  . Financial resource strain: Not on file  . Food insecurity - worry: Not on file  . Food insecurity - inability: Not on file  . Transportation needs - medical: Not on file  . Transportation needs - non-medical: Not on file  Occupational History  . Not on file  Tobacco Use  . Smoking status: Former Smoker    Packs/day: 0.50    Years: 25.00    Pack years: 12.50    Last attempt to quit: 03/27/2008    Years since quitting: 8.8  . Smokeless tobacco: Never Used  Substance and Sexual Activity  . Alcohol use: No  . Drug use: No  . Sexual activity: Not on file  Other Topics Concern  . Not on file  Social History Narrative  . Not on file   Past Surgical History:  Procedure Laterality Date  . BACK SURGERY    . CHOLECYSTECTOMY    . COLONOSCOPY    . KNEE SURGERY    . LUMBAR DISC SURGERY    . NECK SURGERY    . POLYPECTOMY    .  TOTAL ABDOMINAL HYSTERECTOMY W/ BILATERAL SALPINGOOPHORECTOMY    . UPPER GASTROINTESTINAL ENDOSCOPY     Past Medical History:  Diagnosis Date  . Adenomatous colon polyp   . Allergy   . Anemia   . Arthritis   . Esophageal stricture   . GERD (gastroesophageal reflux disease)   . Hiatal hernia   . Hyperlipidemia   . Hypertension   . IBS (irritable bowel syndrome)   . Low back pain   . Macular degeneration   . Muscular degeneration   . Peripheral vascular disease ([place])    There were no vitals taken for this visit.  Opioid Risk Score:   Fall Risk Score:  `1  Depression screen PHQ 2/9  Depression screen [facility] 2/9 12/13/2016 11/06/2016 03/15/2016 11/24/2015 02/24/2015 10/27/2014 10/06/2013  Decreased Interest 0 0 0 0 2 0 0  Down, Depressed, Hopeless 0 0 0 0 0 0 -  PHQ - 2 Score 0 0 0 0 2 0 0  Altered sleeping - - - - 1 - -  Tired, decreased energy - -  - - 2 - -  Change in appetite - - - - 0 - -  Feeling bad or failure about yourself  - - - - 0 - -  Trouble concentrating - - - - 0 - -  Moving slowly or fidgety/restless - - - - 1 - -  Suicidal thoughts - - - - 0 - -  PHQ-9 Score - - - - 6 - -  Difficult doing work/chores - - - - Not difficult at all - -     Review of Systems  Constitutional: Positive for diaphoresis and unexpected weight change.  HENT: Negative.   Eyes: Negative.   Respiratory: Negative.   Cardiovascular: Negative.   Gastrointestinal: Positive for constipation.  Endocrine: Negative.   Genitourinary: Negative.   Musculoskeletal: Positive for joint swelling.  Skin: Negative.   Allergic/Immunologic: Negative.   Neurological: Negative.   Hematological: Negative.   Psychiatric/Behavioral: Negative.   All other systems reviewed and are negative.      Objective:   Physical Exam Constitutional: She is oriented to person, place, and time. She appears well-developedand well-nourished.  HENT:  Head: Normocephalicand atraumatic.  Neck: Normal range of motion. Neck supple.  Cardiovascular: reg rate Pulmonary/Chest: normal effort. .  Musculoskeletal:  Right knee with mild crepitus, minimal effusion.  Gait is minimally antalgic.  She has generalized tenderness over ankles both hands and the left knee all with out swelling LB TTP.  Neurological: a and o x 3.  Skin: Skin is warmand dry.   Psychiatric: She has a normal mood and affect.         Assessment & Plan:  1. 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              -continue-mobic  -needs name brand name voltaren gel---wrote name brand rx written             -tramadol was refilled today             -no knee injection required today. May be a zilretta candidate 2. Right Lumbar Radiculitis: Continue Gabapentin.  This was refilled today as well 3. Pain Management: Continue Tramadol and HEP  We reviewed individual exercises and a maintenance program  today. 15 minutes of face to face patient care time was spent during this visit. All questions were encouraged and answered.  Reviewed additional options for pain management including exercise and relaxation techniques.  I will see her back  in about 3 months time.

## 2017-02-14 ENCOUNTER — Ambulatory Visit: Payer: Medicare HMO | Admitting: Podiatry

## 2017-02-27 ENCOUNTER — Encounter: Payer: Self-pay | Admitting: Adult Health

## 2017-02-27 ENCOUNTER — Ambulatory Visit (INDEPENDENT_AMBULATORY_CARE_PROVIDER_SITE_OTHER): Payer: Medicare HMO | Admitting: Adult Health

## 2017-02-27 VITALS — BP 134/70 | Temp 96.5°F | Wt 164.0 lb

## 2017-02-27 DIAGNOSIS — K219 Gastro-esophageal reflux disease without esophagitis: Secondary | ICD-10-CM

## 2017-02-27 MED ORDER — RABEPRAZOLE SODIUM 20 MG PO TBEC: 20.0000 mg | DELAYED_RELEASE_TABLET | Freq: Every day | ORAL | 1 refills | Status: DC

## 2017-02-27 NOTE — Progress Notes (Signed)
Subjective:    Patient ID: Kelsey Sparks, female    DOB: 1946-02-02, 71 y.o.   MRN: [id number]  HPI  71 year old female who  has a past medical history of Adenomatous colon polyp, Allergy, Anemia, Arthritis, Esophageal stricture, GERD (gastroesophageal reflux disease), Hiatal hernia, Hyperlipidemia, Hypertension, IBS (irritable bowel syndrome), Low back pain, Macular degeneration, Muscular degeneration, and Peripheral vascular disease ([place]).  She presents to the office today for GERD. I prescribed her Omeprazole in the past and has been taking it but feels as though it is causing her to have an upset stomach. She has quit taking this medication two weeks ago and feels improved. She does report that she had the same reaction to Omperazole in the past and Dr. Henrene Pastor had prescribed AcipHex, but insurance would not cover it. She has new insurance and reports that they will cover this medication. Per patient about every three days she will have acid reflux.   Review of Systems See HPI  Past Medical History:  Diagnosis Date  . Adenomatous colon polyp   . Allergy   . Anemia   . Arthritis   . Esophageal stricture   . GERD (gastroesophageal reflux disease)   . Hiatal hernia   . Hyperlipidemia   . Hypertension   . IBS (irritable bowel syndrome)   . Low back pain   . Macular degeneration   . Muscular degeneration   . Peripheral vascular disease ([place])     Social History   Socioeconomic History  . Marital status: Married    Spouse name: Not on file  . Number of children: Not on file  . Years of education: Not on file  . Highest education level: Not on file  Social Needs  . Financial resource strain: Not on file  . Food insecurity - worry: Not on file  . Food insecurity - inability: Not on file  . Transportation needs - medical: Not on file  . Transportation needs - non-medical: Not on file  Occupational History  . Not on file  Tobacco Use  . Smoking status: Former Smoker   Packs/day: 0.50    Years: 25.00    Pack years: 12.50    Last attempt to quit: 03/27/2008    Years since quitting: 8.9  . Smokeless tobacco: Never Used  Substance and Sexual Activity  . Alcohol use: No  . Drug use: No  . Sexual activity: Not on file  Other Topics Concern  . Not on file  Social History Narrative  . Not on file    Past Surgical History:  Procedure Laterality Date  . BACK SURGERY    . CHOLECYSTECTOMY    . COLONOSCOPY    . KNEE SURGERY    . LUMBAR DISC SURGERY    . NECK SURGERY    . POLYPECTOMY    . TOTAL ABDOMINAL HYSTERECTOMY W/ BILATERAL SALPINGOOPHORECTOMY    . UPPER GASTROINTESTINAL ENDOSCOPY      Family History  Problem Relation Age of Onset  . Arthritis Father   . Hypertension Father   . Arthritis Mother   . Asthma Mother   . Breast cancer Maternal Aunt   . Colon cancer Neg Hx     No Known Allergies  Current Outpatient Medications on File Prior to Visit  Medication Sig Dispense Refill  . aspirin 81 MG tablet Take 81 mg by mouth daily.    Marland Kitchen atenolol (TENORMIN) 50 MG tablet Take 1 tablet (50 mg total) by mouth every morning.  100 tablet 3  . baclofen (LIORESAL) 10 MG tablet Take 10 mg by mouth 2 (two) times daily.    . cetirizine (ZYRTEC) 10 MG tablet Take 10 mg by mouth daily.    . cyclobenzaprine (FLEXERIL) 10 MG tablet take 1 tablet by mouth at bedtime if needed 90 tablet 1  . fluticasone (FLONASE) 50 MCG/ACT nasal spray instill 2 sprays into each nostril once daily 48 g 3  . gabapentin (NEURONTIN) 100 MG capsule Take 1 capsule (100 mg total) 3 (three) times daily by mouth. 270 capsule 2  . hydrocortisone (ANUSOL-HC) 25 MG suppository Place 1 suppository (25 mg total) rectally 2 (two) times daily as needed for hemorrhoids or itching. 30 suppository 6  . meclizine (ANTIVERT) 25 MG tablet TAKE 1/2 TO 1 TABLET EVERY 6 HOURS IF NEEDED 40 tablet 0  . meloxicam (MOBIC) 15 MG tablet Take 1 tablet (15 mg total) by mouth daily. 90 tablet 0  . Polyethyl  Glycol-Propyl Glycol (SYSTANE) 0.4-0.3 % SOLN Apply to eye.    . simvastatin (ZOCOR) 40 MG tablet Take 1 tablet (40 mg total) by mouth daily. 90 tablet 1  . traMADol (ULTRAM) 50 MG tablet take 1-2 tablets by mouth every 8 hours if needed 405 tablet 1  . triamterene-hydrochlorothiazide (MAXZIDE-25) 37.5-25 MG tablet Take 1 tablet by mouth every morning. 90 tablet 0  . VOLTAREN 1 % GEL Apply 2 g 4 (four) times daily topically. 3 Tube 4  . zonisamide (ZONEGRAN) 25 MG capsule Take 50 mg by mouth daily. Increase as directed  0  . [DISCONTINUED] ferrous sulfate 325 (65 FE) MG tablet Take 325 mg by mouth daily with breakfast.       No current facility-administered medications on file prior to visit.     BP 134/70 (BP Location: Left Arm)   Temp (!) 96.5 F (35.8 C) (Temporal)   Wt 164 lb (74.4 kg)   BMI 28.15 kg/m       Objective:   Physical Exam  Constitutional: She is oriented to person, place, and time. She appears well-developed and well-nourished. No distress.  Cardiovascular: Normal rate, regular rhythm, normal heart sounds and intact distal pulses. Exam reveals no gallop and no friction rub.  No murmur heard. Pulmonary/Chest: Effort normal and breath sounds normal. No respiratory distress. She has no wheezes. She has no rales. She exhibits no tenderness.  Abdominal: Soft. Bowel sounds are normal. She exhibits no distension and no mass. There is no tenderness. There is no rebound and no guarding.  Neurological: She is alert and oriented to person, place, and time.  Skin: Skin is warm and dry. No rash noted. She is not diaphoretic. No erythema. No pallor.  Psychiatric: She has a normal mood and affect. Her behavior is normal. Judgment and thought content normal.  Nursing note and vitals reviewed.     Assessment & Plan:  1. Gastroesophageal reflux disease, esophagitis presence not specified - RABEprazole (ACIPHEX) 20 MG tablet; Take 1 tablet (20 mg total) by mouth daily.  Dispense: 90  tablet; Refill: 1 - Advised patient that this was the same class of medications as Omeprazole so there is a change she is going to have the same symptoms   Dorothyann Peng, NP

## 2017-02-28 ENCOUNTER — Ambulatory Visit (INDEPENDENT_AMBULATORY_CARE_PROVIDER_SITE_OTHER): Payer: Medicare HMO | Admitting: Podiatry

## 2017-02-28 ENCOUNTER — Encounter: Payer: Self-pay | Admitting: Podiatry

## 2017-02-28 DIAGNOSIS — M129 Arthropathy, unspecified: Secondary | ICD-10-CM

## 2017-02-28 DIAGNOSIS — M79676 Pain in unspecified toe(s): Secondary | ICD-10-CM | POA: Diagnosis not present

## 2017-02-28 DIAGNOSIS — B351 Tinea unguium: Secondary | ICD-10-CM

## 2017-02-28 NOTE — Progress Notes (Addendum)
Patient ID: Kelsey Sparks, female   DOB: 1945/11/09, 71 y.o.   MRN: [id number] Complaint:  Visit Type: Patient returns to my office for continued preventative foot care services. Complaint: Patient states" my nails have grown long and thick and become painful to walk and wear shoes" . The patient presents for preventative foot care services. No changes to ROS.  Patient says she has pain in big toe joint right foot.  Podiatric Exam: Vascular: dorsalis pedis and posterior tibial pulses are palpable bilateral. Capillary return is immediate. Temperature gradient is WNL. Skin turgor WNL  Sensorium: Normal Semmes Weinstein monofilament test. Normal tactile sensation bilaterally. Nail Exam: Pt has thick disfigured discolored nails with subungual debris noted bilateral entire nail hallux through fifth toenails with the exception fourth toenail left foot. Ulcer Exam: There is no evidence of ulcer or pre-ulcerative changes or infection. Orthopedic Exam: Muscle tone and strength are WNL. No limitations in general ROM. No crepitus or effusions noted. Foot type and digits show no abnormalities. HAV  B/L. Skin: No Porokeratosis. No infection or ulcers  Diagnosis:  Onychomycosis, , Pain in right toe, pain in left toes  Treatment & Plan Procedures and Treatment: Consent by patient was obtained for treatment procedures. The patient understood the discussion of treatment and procedures well. All questions were answered thoroughly reviewed. Debridement of mycotic and hypertrophic toenails, 1 through 5 bilateral and clearing of subungual debris. No ulceration, no infection noted. Patient told to use voltaren gel for right foot pain. Return Visit-Office Procedure: Patient instructed to return to the office for a follow up visit 3 months for continued evaluation and treatment.    Gardiner Barefoot DPM

## 2017-03-15 ENCOUNTER — Other Ambulatory Visit: Payer: Self-pay

## 2017-03-15 DIAGNOSIS — M19042 Primary osteoarthritis, left hand: Secondary | ICD-10-CM

## 2017-03-15 DIAGNOSIS — M5416 Radiculopathy, lumbar region: Secondary | ICD-10-CM

## 2017-03-15 DIAGNOSIS — M1732 Unilateral post-traumatic osteoarthritis, left knee: Secondary | ICD-10-CM

## 2017-03-15 DIAGNOSIS — M19041 Primary osteoarthritis, right hand: Secondary | ICD-10-CM

## 2017-03-15 DIAGNOSIS — M1711 Unilateral primary osteoarthritis, right knee: Secondary | ICD-10-CM

## 2017-03-15 MED ORDER — MELOXICAM 15 MG PO TABS: 15.0000 mg | ORAL_TABLET | Freq: Every day | ORAL | 1 refills | Status: DC

## 2017-03-28 ENCOUNTER — Encounter: Payer: Self-pay | Admitting: Internal Medicine

## 2017-04-25 ENCOUNTER — Encounter: Payer: Self-pay | Admitting: Adult Health

## 2017-04-25 ENCOUNTER — Ambulatory Visit (INDEPENDENT_AMBULATORY_CARE_PROVIDER_SITE_OTHER): Payer: Medicare HMO | Admitting: Adult Health

## 2017-04-25 ENCOUNTER — Telehealth: Payer: Self-pay | Admitting: Adult Health

## 2017-04-25 VITALS — BP 110/70 | HR 86 | Temp 97.7°F | Ht 64.0 in | Wt 167.6 lb

## 2017-04-25 DIAGNOSIS — R600 Localized edema: Secondary | ICD-10-CM

## 2017-04-25 NOTE — Telephone Encounter (Signed)
Copied from [place]. Topic: Inquiry >> Apr 25, 2017  4:17 PM Malena Catholic I, NT wrote: Reason for CRM:Pt called state she need her Doctor Nurse to call her

## 2017-04-25 NOTE — Progress Notes (Signed)
Subjective:    Patient ID: Kelsey Sparks, female    DOB: November 09, 1945, 72 y.o.   MRN: [id number]  HPI 72 year old female who  has a past medical history of Adenomatous colon polyp, Allergy, Anemia, Arthritis, Esophageal stricture, GERD (gastroesophageal reflux disease), Hiatal hernia, Hyperlipidemia, Hypertension, IBS (irritable bowel syndrome), Low back pain, Macular degeneration, Muscular degeneration, and Peripheral vascular disease ([place]).  She presents to the office today for less than 24 hours of bilateral lower extremity swelling. She reports that she woke up this morning and noticed that both of her ankles were swollen. She denies any pain, redness, warmth, or calf tenderness.   When questioned about her diet she reports eating pork, fat back, and butter beans with salt the night before.   She denies any chest pain or shortness of breath    Review of Systems See HPI   Past Medical History:  Diagnosis Date  . Adenomatous colon polyp   . Allergy   . Anemia   . Arthritis   . Esophageal stricture   . GERD (gastroesophageal reflux disease)   . Hiatal hernia   . Hyperlipidemia   . Hypertension   . IBS (irritable bowel syndrome)   . Low back pain   . Macular degeneration   . Muscular degeneration   . Peripheral vascular disease ([place])     Social History   Socioeconomic History  . Marital status: Married    Spouse name: Not on file  . Number of children: Not on file  . Years of education: Not on file  . Highest education level: Not on file  Social Needs  . Financial resource strain: Not on file  . Food insecurity - worry: Not on file  . Food insecurity - inability: Not on file  . Transportation needs - medical: Not on file  . Transportation needs - non-medical: Not on file  Occupational History  . Not on file  Tobacco Use  . Smoking status: Former Smoker    Packs/day: 0.50    Years: 25.00    Pack years: 12.50    Last attempt to quit: 03/27/2008    Years since  quitting: 9.0  . Smokeless tobacco: Never Used  Substance and Sexual Activity  . Alcohol use: No  . Drug use: No  . Sexual activity: Not on file  Other Topics Concern  . Not on file  Social History Narrative  . Not on file    Past Surgical History:  Procedure Laterality Date  . BACK SURGERY    . CHOLECYSTECTOMY    . COLONOSCOPY    . KNEE SURGERY    . LUMBAR DISC SURGERY    . NECK SURGERY    . POLYPECTOMY    . TOTAL ABDOMINAL HYSTERECTOMY W/ BILATERAL SALPINGOOPHORECTOMY    . UPPER GASTROINTESTINAL ENDOSCOPY      Family History  Problem Relation Age of Onset  . Arthritis Father   . Hypertension Father   . Arthritis Mother   . Asthma Mother   . Breast cancer Maternal Aunt   . Colon cancer Neg Hx     No Known Allergies  Current Outpatient Medications on File Prior to Visit  Medication Sig Dispense Refill  . aspirin 81 MG tablet Take 81 mg by mouth daily.    Marland Kitchen atenolol (TENORMIN) 50 MG tablet Take 1 tablet (50 mg total) by mouth every morning. 100 tablet 3  . baclofen (LIORESAL) 10 MG tablet Take 10 mg by mouth 2 (two)  times daily.    . cetirizine (ZYRTEC) 10 MG tablet Take 10 mg by mouth daily.    . cyclobenzaprine (FLEXERIL) 10 MG tablet take 1 tablet by mouth at bedtime if needed 90 tablet 1  . fluticasone (FLONASE) 50 MCG/ACT nasal spray instill 2 sprays into each nostril once daily 48 g 3  . gabapentin (NEURONTIN) 100 MG capsule Take 1 capsule (100 mg total) 3 (three) times daily by mouth. 270 capsule 2  . hydrocortisone (ANUSOL-HC) 25 MG suppository Place 1 suppository (25 mg total) rectally 2 (two) times daily as needed for hemorrhoids or itching. 30 suppository 6  . meclizine (ANTIVERT) 25 MG tablet TAKE 1/2 TO 1 TABLET EVERY 6 HOURS IF NEEDED 40 tablet 0  . meloxicam (MOBIC) 15 MG tablet Take 1 tablet (15 mg total) by mouth daily. 90 tablet 1  . Polyethyl Glycol-Propyl Glycol (SYSTANE) 0.4-0.3 % SOLN Apply to eye.    . RABEprazole (ACIPHEX) 20 MG tablet Take 1  tablet (20 mg total) by mouth daily. 90 tablet 1  . simvastatin (ZOCOR) 40 MG tablet Take 1 tablet (40 mg total) by mouth daily. 90 tablet 1  . traMADol (ULTRAM) 50 MG tablet take 1-2 tablets by mouth every 8 hours if needed 405 tablet 1  . triamterene-hydrochlorothiazide (MAXZIDE-25) 37.5-25 MG tablet Take 1 tablet by mouth every morning. 90 tablet 0  . VOLTAREN 1 % GEL Apply 2 g 4 (four) times daily topically. 3 Tube 4  . zonisamide (ZONEGRAN) 25 MG capsule Take 50 mg by mouth daily. Increase as directed  0  . [DISCONTINUED] ferrous sulfate 325 (65 FE) MG tablet Take 325 mg by mouth daily with breakfast.       No current facility-administered medications on file prior to visit.     BP 110/70 (BP Location: Right Arm, Patient Position: Sitting, Cuff Size: Large)   Pulse 86   Temp 97.7 F (36.5 C) (Oral)   Ht 5\' 4"  (1.626 m)   Wt 167 lb 9.6 oz (76 kg)   SpO2 96%   BMI 28.77 kg/m       Objective:   Physical Exam  Constitutional: She is oriented to person, place, and time. She appears well-developed and well-nourished. No distress.  Cardiovascular: Normal rate, regular rhythm, normal heart sounds and intact distal pulses. Exam reveals no gallop and no friction rub.  No murmur heard. Pulmonary/Chest: Effort normal and breath sounds normal. No respiratory distress. She has no wheezes. She has no rales. She exhibits no tenderness.  Musculoskeletal: Normal range of motion. She exhibits edema. She exhibits no tenderness.  Trace non pitting bilateral lower extremity swelling around the ankles.  No redness, warmth, or calf tenderness noted   Neurological: She is alert and oriented to person, place, and time.  Skin: Skin is warm and dry. No rash noted. She is not diaphoretic. No erythema. No pallor.  Psychiatric: She has a normal mood and affect. Her behavior is normal. Judgment and thought content normal.  Nursing note and vitals reviewed.     Assessment & Plan:  1. Lower extremity  edema - Likely diet controlled.  - Advised low salt diet  - Drink plenty of water and elevate legs above heart.  - Follow up if not resolved or becomes worse   Dorothyann Peng, NP

## 2017-04-26 ENCOUNTER — Other Ambulatory Visit: Payer: Self-pay | Admitting: Adult Health

## 2017-04-26 ENCOUNTER — Other Ambulatory Visit: Payer: Self-pay

## 2017-04-26 DIAGNOSIS — H6690 Otitis media, unspecified, unspecified ear: Secondary | ICD-10-CM

## 2017-04-26 MED ORDER — FLUTICASONE PROPIONATE 50 MCG/ACT NA SUSP: NASAL | 3 refills | Status: DC

## 2017-04-26 NOTE — Telephone Encounter (Signed)
I spoke with pt and this note should have been for husband. We already have a note for him.

## 2017-04-26 NOTE — Telephone Encounter (Signed)
Copied from [place]. Topic: Quick Communication - Rx Refill/Question >> Apr 26, 2017  1:16 PM Margot Ables wrote: Medication: per pt she marked 2 meds on med list at [facility] 04/25/17 and gave to [facility] and requested refills. Pt states Nasonex and 1 other med but did not remember the name.  Has the patient contacted their pharmacy? Yes.    Preferred Pharmacy (with phone number or street name): [facility] [address] [place], [place] [phone] (Phone) [phone] (Fax)  Agent: Please be advised that RX refills may take up to 3 business days. We ask that you follow-up with your pharmacy.

## 2017-04-26 NOTE — Telephone Encounter (Signed)
Spoke with patient. Medication sent in electronically.

## 2017-04-26 NOTE — Telephone Encounter (Signed)
Spoke with patient. Medication sent to pharmacy electronically.

## 2017-04-26 NOTE — Telephone Encounter (Signed)
That is fine   Can give 3 refills

## 2017-04-27 ENCOUNTER — Encounter: Payer: Self-pay | Admitting: Internal Medicine

## 2017-04-27 ENCOUNTER — Other Ambulatory Visit: Payer: Self-pay

## 2017-04-27 MED ORDER — POLYETHYL GLYCOL-PROPYL GLYCOL 0.4-0.3 % OP SOLN: 2.0000 [drp] | Freq: Two times a day (BID) | OPHTHALMIC | 3 refills | Status: DC

## 2017-04-27 MED ORDER — POLYETHYL GLYCOL-PROPYL GLYCOL 0.4-0.3 % OP SOLN: 2.0000 [drp] | Freq: Two times a day (BID) | OPHTHALMIC | 3 refills | Status: AC

## 2017-04-27 NOTE — Telephone Encounter (Signed)
Medication sent to pharmacy electronically.  

## 2017-04-27 NOTE — Telephone Encounter (Signed)
Medication sent electronically 

## 2017-05-01 ENCOUNTER — Ambulatory Visit (INDEPENDENT_AMBULATORY_CARE_PROVIDER_SITE_OTHER): Payer: Medicare HMO | Admitting: Adult Health

## 2017-05-01 ENCOUNTER — Encounter: Payer: Self-pay | Admitting: Adult Health

## 2017-05-01 VITALS — BP 140/84 | Temp 97.6°F | Wt 166.0 lb

## 2017-05-01 DIAGNOSIS — G43019 Migraine without aura, intractable, without status migrainosus: Secondary | ICD-10-CM | POA: Diagnosis not present

## 2017-05-01 DIAGNOSIS — M7121 Synovial cyst of popliteal space [Baker], right knee: Secondary | ICD-10-CM

## 2017-05-01 NOTE — Progress Notes (Signed)
Subjective:    Patient ID: Kelsey Sparks, female    DOB: June 27, 1945, 72 y.o.   MRN: [id number]  HPI  72 year old female who  has a past medical history of Adenomatous colon polyp, Allergy, Anemia, Arthritis, Esophageal stricture, GERD (gastroesophageal reflux disease), Hiatal hernia, Hyperlipidemia, Hypertension, IBS (irritable bowel syndrome), Low back pain, Macular degeneration, Muscular degeneration, and Peripheral vascular disease ([place]).  Presents with pain behind right knee. Pain is worse with walking. Denies any pain to knee or calf. Has not noticed any redness, warmth or swelling.     Review of Systems See HPI  Past Medical History:  Diagnosis Date  . Adenomatous colon polyp   . Allergy   . Anemia   . Arthritis   . Esophageal stricture   . GERD (gastroesophageal reflux disease)   . Hiatal hernia   . Hyperlipidemia   . Hypertension   . IBS (irritable bowel syndrome)   . Low back pain   . Macular degeneration   . Muscular degeneration   . Peripheral vascular disease ([place])     Social History   Socioeconomic History  . Marital status: Married    Spouse name: Not on file  . Number of children: Not on file  . Years of education: Not on file  . Highest education level: Not on file  Social Needs  . Financial resource strain: Not on file  . Food insecurity - worry: Not on file  . Food insecurity - inability: Not on file  . Transportation needs - medical: Not on file  . Transportation needs - non-medical: Not on file  Occupational History  . Not on file  Tobacco Use  . Smoking status: Former Smoker    Packs/day: 0.50    Years: 25.00    Pack years: 12.50    Last attempt to quit: 03/27/2008    Years since quitting: 9.1  . Smokeless tobacco: Never Used  Substance and Sexual Activity  . Alcohol use: No  . Drug use: No  . Sexual activity: Not on file  Other Topics Concern  . Not on file  Social History Narrative  . Not on file    Past Surgical History:    Procedure Laterality Date  . BACK SURGERY    . CHOLECYSTECTOMY    . COLONOSCOPY    . KNEE SURGERY    . LUMBAR DISC SURGERY    . NECK SURGERY    . POLYPECTOMY    . TOTAL ABDOMINAL HYSTERECTOMY W/ BILATERAL SALPINGOOPHORECTOMY    . UPPER GASTROINTESTINAL ENDOSCOPY      Family History  Problem Relation Age of Onset  . Arthritis Father   . Hypertension Father   . Arthritis Mother   . Asthma Mother   . Breast cancer Maternal Aunt   . Colon cancer Neg Hx     No Known Allergies  Current Outpatient Medications on File Prior to Visit  Medication Sig Dispense Refill  . aspirin 81 MG tablet Take 81 mg by mouth daily.    Marland Kitchen atenolol (TENORMIN) 50 MG tablet Take 1 tablet (50 mg total) by mouth every morning. 100 tablet 3  . baclofen (LIORESAL) 10 MG tablet Take 10 mg by mouth 2 (two) times daily.    . cetirizine (ZYRTEC) 10 MG tablet Take 10 mg by mouth daily.    . cyclobenzaprine (FLEXERIL) 10 MG tablet take 1 tablet by mouth at bedtime if needed 90 tablet 1  . fluticasone (FLONASE) 50 MCG/ACT nasal spray  instill 2 sprays into each nostril once daily 48 g 3  . gabapentin (NEURONTIN) 100 MG capsule Take 1 capsule (100 mg total) 3 (three) times daily by mouth. 270 capsule 2  . hydrocortisone (ANUSOL-HC) 25 MG suppository Place 1 suppository (25 mg total) rectally 2 (two) times daily as needed for hemorrhoids or itching. 30 suppository 6  . meclizine (ANTIVERT) 25 MG tablet TAKE 1/2 TO 1 TABLET EVERY 6 HOURS IF NEEDED 40 tablet 0  . meloxicam (MOBIC) 15 MG tablet Take 1 tablet (15 mg total) by mouth daily. 90 tablet 1  . Polyethyl Glycol-Propyl Glycol (SYSTANE) 0.4-0.3 % SOLN Apply 2 drops to eye 2 (two) times daily. 4 mL 3  . RABEprazole (ACIPHEX) 20 MG tablet Take 1 tablet (20 mg total) by mouth daily. 90 tablet 1  . simvastatin (ZOCOR) 40 MG tablet Take 1 tablet (40 mg total) by mouth daily. 90 tablet 1  . traMADol (ULTRAM) 50 MG tablet take 1-2 tablets by mouth every 8 hours if needed  405 tablet 1  . triamterene-hydrochlorothiazide (MAXZIDE-25) 37.5-25 MG tablet Take 1 tablet by mouth every morning. 90 tablet 0  . VOLTAREN 1 % GEL Apply 2 g 4 (four) times daily topically. 3 Tube 4  . zonisamide (ZONEGRAN) 25 MG capsule Take 50 mg by mouth daily. Increase as directed  0  . [DISCONTINUED] ferrous sulfate 325 (65 FE) MG tablet Take 325 mg by mouth daily with breakfast.       No current facility-administered medications on file prior to visit.     BP 140/84 (BP Location: Left Arm)   Temp 97.6 F (36.4 C) (Oral)   Wt 166 lb (75.3 kg)   BMI 28.49 kg/m       Objective:   Physical Exam  Constitutional: She is oriented to person, place, and time. She appears well-developed and well-nourished. No distress.  Cardiovascular: Normal rate, regular rhythm, normal heart sounds and intact distal pulses. Exam reveals no gallop.  No murmur heard. Pulmonary/Chest: Effort normal and breath sounds normal. No respiratory distress. She has no wheezes. She has no rales. She exhibits no tenderness.  Musculoskeletal: She exhibits tenderness.  Tenderness with palpation behind right knee. Trace edema. No redness, warmth or calf tenderness expressed. Knee is stable   Neurological: She is alert and oriented to person, place, and time.  Skin: Skin is warm and dry. No rash noted. She is not diaphoretic. No erythema. No pallor.  Psychiatric: She has a normal mood and affect. Her behavior is normal. Judgment and thought content normal.  Nursing note and vitals reviewed.      Assessment & Plan:  1. Synovial cyst of right popliteal space - Discussed options  - We will hold off on steroid injection at this time. She would like to see if her pain improves over the next week or two  - No concern for DVT  - Follow up as needed  Dorothyann Peng, NP

## 2017-05-09 ENCOUNTER — Encounter: Payer: Medicare HMO | Attending: Physical Medicine & Rehabilitation | Admitting: Physical Medicine & Rehabilitation

## 2017-05-09 ENCOUNTER — Encounter: Payer: Self-pay | Admitting: Physical Medicine & Rehabilitation

## 2017-05-09 VITALS — BP 106/70 | HR 74

## 2017-05-09 DIAGNOSIS — M7581 Other shoulder lesions, right shoulder: Secondary | ICD-10-CM | POA: Diagnosis not present

## 2017-05-09 DIAGNOSIS — Z5181 Encounter for therapeutic drug level monitoring: Secondary | ICD-10-CM | POA: Diagnosis not present

## 2017-05-09 DIAGNOSIS — E785 Hyperlipidemia, unspecified: Secondary | ICD-10-CM | POA: Diagnosis not present

## 2017-05-09 DIAGNOSIS — M19011 Primary osteoarthritis, right shoulder: Secondary | ICD-10-CM | POA: Insufficient documentation

## 2017-05-09 DIAGNOSIS — M19042 Primary osteoarthritis, left hand: Secondary | ICD-10-CM | POA: Insufficient documentation

## 2017-05-09 DIAGNOSIS — M25512 Pain in left shoulder: Secondary | ICD-10-CM | POA: Diagnosis present

## 2017-05-09 DIAGNOSIS — M19041 Primary osteoarthritis, right hand: Secondary | ICD-10-CM | POA: Insufficient documentation

## 2017-05-09 DIAGNOSIS — Z87891 Personal history of nicotine dependence: Secondary | ICD-10-CM | POA: Insufficient documentation

## 2017-05-09 DIAGNOSIS — R200 Anesthesia of skin: Secondary | ICD-10-CM | POA: Diagnosis not present

## 2017-05-09 DIAGNOSIS — M1712 Unilateral primary osteoarthritis, left knee: Secondary | ICD-10-CM | POA: Diagnosis not present

## 2017-05-09 DIAGNOSIS — R42 Dizziness and giddiness: Secondary | ICD-10-CM | POA: Diagnosis not present

## 2017-05-09 DIAGNOSIS — Z79899 Other long term (current) drug therapy: Secondary | ICD-10-CM

## 2017-05-09 DIAGNOSIS — I739 Peripheral vascular disease, unspecified: Secondary | ICD-10-CM | POA: Diagnosis not present

## 2017-05-09 DIAGNOSIS — M1711 Unilateral primary osteoarthritis, right knee: Secondary | ICD-10-CM

## 2017-05-09 DIAGNOSIS — K449 Diaphragmatic hernia without obstruction or gangrene: Secondary | ICD-10-CM | POA: Insufficient documentation

## 2017-05-09 DIAGNOSIS — M4726 Other spondylosis with radiculopathy, lumbar region: Secondary | ICD-10-CM | POA: Diagnosis not present

## 2017-05-09 DIAGNOSIS — M961 Postlaminectomy syndrome, not elsewhere classified: Secondary | ICD-10-CM | POA: Insufficient documentation

## 2017-05-09 DIAGNOSIS — R202 Paresthesia of skin: Secondary | ICD-10-CM | POA: Insufficient documentation

## 2017-05-09 DIAGNOSIS — I10 Essential (primary) hypertension: Secondary | ICD-10-CM | POA: Diagnosis not present

## 2017-05-09 DIAGNOSIS — H3530 Unspecified macular degeneration: Secondary | ICD-10-CM | POA: Insufficient documentation

## 2017-05-09 DIAGNOSIS — K219 Gastro-esophageal reflux disease without esophagitis: Secondary | ICD-10-CM | POA: Diagnosis not present

## 2017-05-09 DIAGNOSIS — K589 Irritable bowel syndrome without diarrhea: Secondary | ICD-10-CM | POA: Insufficient documentation

## 2017-05-09 DIAGNOSIS — G8929 Other chronic pain: Secondary | ICD-10-CM | POA: Insufficient documentation

## 2017-05-09 NOTE — Progress Notes (Signed)
Subjective:    Patient ID: Kelsey Sparks, female    DOB: 1945/08/30, 72 y.o.   MRN: [id number]  HPI   Kelsey Sparks is here in follow up of her chronic pain. She has had 2-3 weeks of increased knee pain, particularly in the posterior knee. The knee has been more tender with weight bearing as well as when she has to bend the knee. She saw her primary who recommended consvt mgt. She has been using ice and heat as well as her tramadol to help with the pain. She reportst that the pain is a little better since using the ice and heat.   She remains on gabapentin as prescribed. shes' also taking meloxicam 15mg  daily.   Pain Inventory Average Pain 7 Pain Right Now 8 My pain is aching  In the last 24 hours, has pain interfered with the following? General activity 7 Relation with others 0 Enjoyment of life 4 What TIME of day is your pain at its worst? evening Sleep (in general) Good  Pain is worse with: walking, bending, sitting, inactivity, standing and some activites Pain improves with: rest, heat/ice, therapy/exercise, medication and injections Relief from Meds: 8  Mobility walk without assistance ability to climb steps?  yes do you drive?  yes  Function retired  Neuro/Psych numbness dizziness  Prior Studies Any changes since last visit?  no  Physicians involved in your care Any changes since last visit?  no   Family History  Problem Relation Age of Onset  . Arthritis Kelsey Sparks   . Hypertension Kelsey Sparks   . Arthritis Kelsey Sparks   . Asthma Kelsey Sparks   . Breast cancer Kelsey Sparks   . Colon cancer Neg Hx    Social History   Socioeconomic History  . Marital status: Married    Spouse name: Not on file  . Number of children: Not on file  . Years of education: Not on file  . Highest education level: Not on file  Social Needs  . Financial resource strain: Not on file  . Food insecurity - worry: Not on file  . Food insecurity - inability: Not on file  . Transportation needs -  medical: Not on file  . Transportation needs - non-medical: Not on file  Occupational History  . Not on file  Tobacco Use  . Smoking status: Former Smoker    Packs/day: 0.50    Years: 25.00    Pack years: 12.50    Last attempt to quit: 03/27/2008    Years since quitting: 9.1  . Smokeless tobacco: Never Used  Substance and Sexual Activity  . Alcohol use: No  . Drug use: No  . Sexual activity: Not on file  Other Topics Concern  . Not on file  Social History Narrative  . Not on file   Past Surgical History:  Procedure Laterality Date  . BACK SURGERY    . CHOLECYSTECTOMY    . COLONOSCOPY    . KNEE SURGERY    . LUMBAR DISC SURGERY    . NECK SURGERY    . POLYPECTOMY    . TOTAL ABDOMINAL HYSTERECTOMY W/ BILATERAL SALPINGOOPHORECTOMY    . UPPER GASTROINTESTINAL ENDOSCOPY     Past Medical History:  Diagnosis Date  . Adenomatous colon polyp   . Allergy   . Anemia   . Arthritis   . Esophageal stricture   . GERD (gastroesophageal reflux disease)   . Hiatal hernia   . Hyperlipidemia   . Hypertension   . IBS (irritable  bowel syndrome)   . Low back pain   . Macular degeneration   . Muscular degeneration   . Peripheral vascular disease ([place])    There were no vitals taken for this visit.  Opioid Risk Score:   Fall Risk Score:  `1  Depression screen PHQ 2/9  Depression screen [facility] 2/9 12/13/2016 11/06/2016 03/15/2016 11/24/2015 02/24/2015 10/27/2014 10/06/2013  Decreased Interest 0 0 0 0 2 0 0  Down, Depressed, Hopeless 0 0 0 0 0 0 -  PHQ - 2 Score 0 0 0 0 2 0 0  Altered sleeping - - - - 1 - -  Tired, decreased energy - - - - 2 - -  Change in appetite - - - - 0 - -  Feeling bad or failure about yourself  - - - - 0 - -  Trouble concentrating - - - - 0 - -  Moving slowly or fidgety/restless - - - - 1 - -  Suicidal thoughts - - - - 0 - -  PHQ-9 Score - - - - 6 - -  Difficult doing work/chores - - - - Not difficult at all - -     Review of Systems  Constitutional: Positive  for chills, diaphoresis, fever and unexpected weight change.  HENT: Negative.   Eyes: Negative.   Respiratory: Negative.   Cardiovascular: Negative.   Gastrointestinal: Positive for constipation.  Endocrine: Negative.   Genitourinary: Negative.   Musculoskeletal: Positive for joint swelling.  Skin: Negative.   Allergic/Immunologic: Negative.   Neurological: Negative.   Hematological: Negative.   Psychiatric/Behavioral: Negative.   All other systems reviewed and are negative.      Objective:   Physical Exam  Constitutional: She is oriented to person, place, and time. She appears well-developedand well-nourished.  HENT:  Head: Normocephalicand atraumatic.  Neck: Normal range of motion. Neck supple.  Cardiovascular: RRR Pulmonary/Chest: normal effort .  Musculoskeletal: Right knee with mild crepitus, swelling and tenderness in right popliteal fossa, .  Gait is  Antalgic on right.  She has generalized tenderness over ankles both hands and the left knee all with out swelling LB TTP. Neurological:AxO x 3. . Skin: Skin is warmand dry.   Psychiatric: She has a normal mood and affect.         Assessment & Plan:  1. 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 [place] was reviewed today.    -UDS today    -ice to posterior knee, basic care instructions provided   -knee injection if fails to heal, ?zilretta  2. Right Lumbar Radiculitis: Continue Gabapentin.  This was refilled today as well 3. Pain Management: Continue Tramadol and HEP             Fifteen minutes of face to face patient care time  were spent during this visit. All questions were encouraged and answered.  Greater than 50% of time during this encounter was spent counseling patient in regard to [facility] of her cyst.

## 2017-05-09 NOTE — Patient Instructions (Signed)
PLEASE FEEL FREE TO CALL OUR OFFICE WITH ANY PROBLEMS OR QUESTIONS ([phone])  WE CAN INJECT YOUR KNEE IF IT FAILS TO IMPROVE. JUST GIVE ME A CALL.

## 2017-05-11 ENCOUNTER — Other Ambulatory Visit: Payer: Self-pay | Admitting: Adult Health

## 2017-05-11 DIAGNOSIS — E785 Hyperlipidemia, unspecified: Secondary | ICD-10-CM

## 2017-05-11 NOTE — Telephone Encounter (Signed)
Sent to the pharmacy by e-scribe. 

## 2017-05-15 LAB — TOXASSURE SELECT,+ANTIDEPR,UR

## 2017-05-16 ENCOUNTER — Telehealth: Payer: Self-pay | Admitting: *Deleted

## 2017-05-16 NOTE — Telephone Encounter (Signed)
Urine drug screen for this encounter is consistent for prescribed medication 

## 2017-05-23 ENCOUNTER — Encounter: Payer: Self-pay | Admitting: Podiatry

## 2017-05-23 ENCOUNTER — Ambulatory Visit (INDEPENDENT_AMBULATORY_CARE_PROVIDER_SITE_OTHER): Payer: Medicare HMO | Admitting: Podiatry

## 2017-05-23 DIAGNOSIS — M79676 Pain in unspecified toe(s): Secondary | ICD-10-CM | POA: Diagnosis not present

## 2017-05-23 DIAGNOSIS — M129 Arthropathy, unspecified: Secondary | ICD-10-CM

## 2017-05-23 DIAGNOSIS — B351 Tinea unguium: Secondary | ICD-10-CM | POA: Diagnosis not present

## 2017-05-23 NOTE — Progress Notes (Signed)
Patient ID: Kelsey Sparks, female   DOB: [birth year], 72 y.o.   MRN: [id number] Complaint:  Visit Type: Patient returns to my office for continued preventative foot care services. Complaint: Patient states" my nails have grown long and thick and become painful to walk and wear shoes" . The patient presents for preventative foot care services. No changes to ROS.  Patient says she has pain in big toe joint right foot.  Podiatric Exam: Vascular: dorsalis pedis and posterior tibial pulses are palpable bilateral. Capillary return is immediate. Temperature gradient is WNL. Skin turgor WNL  Sensorium: Normal Semmes Weinstein monofilament test. Normal tactile sensation bilaterally. Nail Exam: Pt has thick disfigured discolored nails with subungual debris noted bilateral entire nail hallux through fifth toenails with the exception fourth toenail left foot. Ulcer Exam: There is no evidence of ulcer or pre-ulcerative changes or infection. Orthopedic Exam: Muscle tone and strength are WNL. No limitations in general ROM. No crepitus or effusions noted. Foot type and digits show no abnormalities. HAV  B/L. Skin: No Porokeratosis. No infection or ulcers  Diagnosis:  Onychomycosis, , Pain in right toe, pain in left toes  Treatment & Plan Procedures and Treatment: Consent by patient was obtained for treatment procedures. The patient understood the discussion of treatment and procedures well. All questions were answered thoroughly reviewed. Debridement of mycotic and hypertrophic toenails, 1 through 5 bilateral and clearing of subungual debris. No ulceration, no infection noted. Return Visit-Office Procedure: Patient instructed to return to the office for a follow up visit 3 months for continued evaluation and treatment.    Gardiner Barefoot DPM

## 2017-05-24 ENCOUNTER — Other Ambulatory Visit: Payer: Self-pay | Admitting: Adult Health

## 2017-05-25 NOTE — Telephone Encounter (Signed)
DENIED. PATIENT IS NO LONGER TAKING THIS MEDICATION.  NOW TAKING ACIPHEX.  MESSAGE SENT TO THE PHARMACY TO D/C.

## 2017-05-31 ENCOUNTER — Other Ambulatory Visit: Payer: Self-pay | Admitting: Adult Health

## 2017-05-31 ENCOUNTER — Other Ambulatory Visit: Payer: Self-pay | Admitting: Family Medicine

## 2017-05-31 ENCOUNTER — Other Ambulatory Visit: Payer: Self-pay | Admitting: Physical Medicine & Rehabilitation

## 2017-05-31 DIAGNOSIS — M19042 Primary osteoarthritis, left hand: Secondary | ICD-10-CM

## 2017-05-31 DIAGNOSIS — M5416 Radiculopathy, lumbar region: Secondary | ICD-10-CM

## 2017-05-31 DIAGNOSIS — M1711 Unilateral primary osteoarthritis, right knee: Secondary | ICD-10-CM

## 2017-05-31 DIAGNOSIS — K219 Gastro-esophageal reflux disease without esophagitis: Secondary | ICD-10-CM

## 2017-05-31 DIAGNOSIS — M1732 Unilateral post-traumatic osteoarthritis, left knee: Secondary | ICD-10-CM

## 2017-05-31 DIAGNOSIS — M19041 Primary osteoarthritis, right hand: Secondary | ICD-10-CM

## 2017-06-01 NOTE — Telephone Encounter (Signed)
Confirmed pt is using 90 day mail service.  Prescription sent by e-scribe.

## 2017-06-01 NOTE — Telephone Encounter (Signed)
Confirmed pt is using mail order pharmacy.  Prescriptions sent by e-scribe.

## 2017-06-06 ENCOUNTER — Encounter: Payer: Medicare HMO | Attending: Physical Medicine & Rehabilitation | Admitting: Physical Medicine & Rehabilitation

## 2017-06-06 ENCOUNTER — Encounter: Payer: Self-pay | Admitting: Physical Medicine & Rehabilitation

## 2017-06-06 VITALS — BP 122/78 | HR 77

## 2017-06-06 DIAGNOSIS — K589 Irritable bowel syndrome without diarrhea: Secondary | ICD-10-CM | POA: Diagnosis not present

## 2017-06-06 DIAGNOSIS — M961 Postlaminectomy syndrome, not elsewhere classified: Secondary | ICD-10-CM | POA: Diagnosis not present

## 2017-06-06 DIAGNOSIS — I10 Essential (primary) hypertension: Secondary | ICD-10-CM | POA: Diagnosis not present

## 2017-06-06 DIAGNOSIS — M1712 Unilateral primary osteoarthritis, left knee: Secondary | ICD-10-CM | POA: Insufficient documentation

## 2017-06-06 DIAGNOSIS — M19041 Primary osteoarthritis, right hand: Secondary | ICD-10-CM | POA: Diagnosis not present

## 2017-06-06 DIAGNOSIS — M1711 Unilateral primary osteoarthritis, right knee: Secondary | ICD-10-CM | POA: Diagnosis not present

## 2017-06-06 DIAGNOSIS — R200 Anesthesia of skin: Secondary | ICD-10-CM | POA: Insufficient documentation

## 2017-06-06 DIAGNOSIS — Z87891 Personal history of nicotine dependence: Secondary | ICD-10-CM | POA: Insufficient documentation

## 2017-06-06 DIAGNOSIS — E785 Hyperlipidemia, unspecified: Secondary | ICD-10-CM | POA: Diagnosis not present

## 2017-06-06 DIAGNOSIS — M19011 Primary osteoarthritis, right shoulder: Secondary | ICD-10-CM | POA: Insufficient documentation

## 2017-06-06 DIAGNOSIS — M19042 Primary osteoarthritis, left hand: Secondary | ICD-10-CM | POA: Diagnosis not present

## 2017-06-06 DIAGNOSIS — R202 Paresthesia of skin: Secondary | ICD-10-CM | POA: Diagnosis not present

## 2017-06-06 DIAGNOSIS — M4726 Other spondylosis with radiculopathy, lumbar region: Secondary | ICD-10-CM | POA: Diagnosis not present

## 2017-06-06 DIAGNOSIS — M25512 Pain in left shoulder: Secondary | ICD-10-CM | POA: Insufficient documentation

## 2017-06-06 DIAGNOSIS — G8929 Other chronic pain: Secondary | ICD-10-CM | POA: Insufficient documentation

## 2017-06-06 DIAGNOSIS — M7581 Other shoulder lesions, right shoulder: Secondary | ICD-10-CM | POA: Insufficient documentation

## 2017-06-06 DIAGNOSIS — R42 Dizziness and giddiness: Secondary | ICD-10-CM | POA: Insufficient documentation

## 2017-06-06 DIAGNOSIS — K449 Diaphragmatic hernia without obstruction or gangrene: Secondary | ICD-10-CM | POA: Insufficient documentation

## 2017-06-06 DIAGNOSIS — H3530 Unspecified macular degeneration: Secondary | ICD-10-CM | POA: Insufficient documentation

## 2017-06-06 DIAGNOSIS — I739 Peripheral vascular disease, unspecified: Secondary | ICD-10-CM | POA: Insufficient documentation

## 2017-06-06 DIAGNOSIS — K219 Gastro-esophageal reflux disease without esophagitis: Secondary | ICD-10-CM | POA: Insufficient documentation

## 2017-06-06 NOTE — Progress Notes (Signed)
Subjective:    Patient ID: Kelsey Sparks, female    DOB: Aug 23, 1945, 72 y.o.   MRN: [id number]  HPI   Kelsey Sparks is here in follow-up of her chronic pain syndrome.  Last time I saw her regarding her increased right knee pain and potential Baker's cyst issue.  We prescribed conservative measures including ice generalized rest and then resumption of some exercise.  She is reported improvement of her pain since I last saw her but still persistence of pain and some tightness in the anterior and posterior knee.  She is using her ice regularly and occasionally will use some heat as well.  She is taking tramadol for breakthrough pain.  She also likes her Voltaren gel.  Additionally she uses meloxicam Flexeril baclofen and gabapentin to help control her generalized pain syndrome.  Pain Inventory Average Pain 5 Pain Right Now 4 My pain is sharp and aching  In the last 24 hours, has pain interfered with the following? General activity 9 Relation with others 0 Enjoyment of life 9 What TIME of day is your pain at its worst? daytime Sleep (in general) Fair  Pain is worse with: walking, sitting and standing Pain improves with: heat/ice Relief from Meds: 6  Mobility walk without assistance ability to climb steps?  yes do you drive?  yes  Function retired  Neuro/Psych numbness spasms dizziness  Prior Studies Any changes since last visit?  no  Physicians involved in your care Any changes since last visit?  no   Family History  Problem Relation Age of Onset  . Arthritis Father   . Hypertension Father   . Arthritis Mother   . Asthma Mother   . Breast cancer Maternal Aunt   . Colon cancer Neg Hx    Social History   Socioeconomic History  . Marital status: Married    Spouse name: Not on file  . Number of children: Not on file  . Years of education: Not on file  . Highest education level: Not on file  Social Needs  . Financial resource strain: Not on file  . Food  insecurity - worry: Not on file  . Food insecurity - inability: Not on file  . Transportation needs - medical: Not on file  . Transportation needs - non-medical: Not on file  Occupational History  . Not on file  Tobacco Use  . Smoking status: Former Smoker    Packs/day: 0.50    Years: 25.00    Pack years: 12.50    Last attempt to quit: 03/27/2008    Years since quitting: 9.2  . Smokeless tobacco: Never Used  Substance and Sexual Activity  . Alcohol use: No  . Drug use: No  . Sexual activity: Not on file  Other Topics Concern  . Not on file  Social History Narrative  . Not on file   Past Surgical History:  Procedure Laterality Date  . BACK SURGERY    . CHOLECYSTECTOMY    . COLONOSCOPY    . KNEE SURGERY    . LUMBAR DISC SURGERY    . NECK SURGERY    . POLYPECTOMY    . TOTAL ABDOMINAL HYSTERECTOMY W/ BILATERAL SALPINGOOPHORECTOMY    . UPPER GASTROINTESTINAL ENDOSCOPY     Past Medical History:  Diagnosis Date  . Adenomatous colon polyp   . Allergy   . Anemia   . Arthritis   . Esophageal stricture   . GERD (gastroesophageal reflux disease)   . Hiatal hernia   .  Hyperlipidemia   . Hypertension   . IBS (irritable bowel syndrome)   . Low back pain   . Macular degeneration   . Muscular degeneration   . Peripheral vascular disease ([place])    There were no vitals taken for this visit.  Opioid Risk Score:   Fall Risk Score:  `1  Depression screen PHQ 2/9  Depression screen [facility] 2/9 12/13/2016 11/06/2016 03/15/2016 11/24/2015 02/24/2015 10/27/2014 10/06/2013  Decreased Interest 0 0 0 0 2 0 0  Down, Depressed, Hopeless 0 0 0 0 0 0 -  PHQ - 2 Score 0 0 0 0 2 0 0  Altered sleeping - - - - 1 - -  Tired, decreased energy - - - - 2 - -  Change in appetite - - - - 0 - -  Feeling bad or failure about yourself  - - - - 0 - -  Trouble concentrating - - - - 0 - -  Moving slowly or fidgety/restless - - - - 1 - -  Suicidal thoughts - - - - 0 - -  PHQ-9 Score - - - - 6 - -  Difficult  doing work/chores - - - - Not difficult at all - -     Review of Systems  Constitutional: Positive for diaphoresis and unexpected weight change.  HENT: Negative.   Eyes: Negative.   Respiratory: Negative.   Cardiovascular: Negative.   Gastrointestinal: Positive for abdominal pain and constipation.  Endocrine: Negative.   Genitourinary: Negative.   Musculoskeletal: Positive for arthralgias, back pain, joint swelling and myalgias.  Skin: Negative.   Allergic/Immunologic: Negative.   Neurological: Negative.   Hematological: Negative.   Psychiatric/Behavioral: Negative.   All other systems reviewed and are negative.      Objective:   Physical Exam General: No acute distress HEENT: EOMI, oral membranes moist Cards: reg rate  Chest: normal effort Abdomen: Soft, NT, ND Skin: dry, intact Extremities: no edema Musculoskeletal: Right knee with decreased crepitus, swelling. less tenderness in right popliteal fossa, mild right anterior joint effusion..Gait remains somewhat antalgic on right.  Seems to be moving more easily today LB TTP. Neurological:AxO x 3. motor 5/5. Cognition normal Skin: Skin is warmand dry.  Psychiatric: She has a normal mood and affect.         Assessment & Plan:  1. 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 [place] was reviewed today.             -continue ice to   Knee. Exercise as tolerated. Can use heat prior to activity          -After informed consent and preparation of the skin with betadine and isopropyl alcohol, I injected 6mg  (1cc) of celestone and 4cc of  1% lidocaine into the right knee via anterolateral approach. Additionally, aspiration was performed prior to injection. The patient tolerated well, and no complications were encountered. Afterward the area was cleaned and dressed. Post- injection instructions were provided.   2. Right Lumbar Radiculitis: Continue Gabapentin.  3. Pain Management: Continue Tramadol and HEP  15 minutes of face to face patient care time were spent during this visit. All questions were encouraged and answered. Follow  up in 2 months. Greater than 50% of time during this encounter was spent counseling patient/family in regard to pain control, HEP and follow up care after injection.Marland Kitchen

## 2017-06-29 ENCOUNTER — Telehealth: Payer: Self-pay

## 2017-06-29 MED ORDER — VOLTAREN 1 % TD GEL: 2.0000 g | Freq: Four times a day (QID) | TRANSDERMAL | 4 refills | Status: DC

## 2017-06-29 NOTE — Telephone Encounter (Signed)
Fax from pharmacy stating pt needs new prescription for Voltaren Gel. Rx sent to pharmacy.

## 2017-07-03 ENCOUNTER — Other Ambulatory Visit: Payer: Self-pay

## 2017-07-03 ENCOUNTER — Ambulatory Visit (AMBULATORY_SURGERY_CENTER): Payer: Self-pay | Admitting: *Deleted

## 2017-07-03 VITALS — Ht 64.0 in | Wt 168.0 lb

## 2017-07-03 DIAGNOSIS — Z86010 Personal history of colonic polyps: Secondary | ICD-10-CM

## 2017-07-03 MED ORDER — NA SULFATE-K SULFATE-MG SULF 17.5-3.13-1.6 GM/177ML PO SOLN: 1.0000 | Freq: Once | ORAL | 0 refills | Status: AC

## 2017-07-03 NOTE — Progress Notes (Signed)
No egg or soy allergy known to patient  No issues with past sedation with any surgeries  or procedures, no intubation problems  No diet pills per patient No home 02 use per patient  No blood thinners per patient  Pt states issues with constipation - uses metamucil for this - every other day- varies hard/ soft  No A fib or A flutter  EMMI video sent to pt's e mail - no email per pt.

## 2017-07-17 ENCOUNTER — Ambulatory Visit (AMBULATORY_SURGERY_CENTER): Payer: Medicare HMO | Admitting: Internal Medicine

## 2017-07-17 ENCOUNTER — Encounter: Payer: Self-pay | Admitting: Internal Medicine

## 2017-07-17 ENCOUNTER — Other Ambulatory Visit: Payer: Self-pay | Admitting: Internal Medicine

## 2017-07-17 ENCOUNTER — Other Ambulatory Visit: Payer: Self-pay

## 2017-07-17 VITALS — BP 130/86 | HR 86 | Temp 96.4°F | Resp 24 | Ht 64.0 in | Wt 168.0 lb

## 2017-07-17 DIAGNOSIS — D122 Benign neoplasm of ascending colon: Secondary | ICD-10-CM | POA: Diagnosis not present

## 2017-07-17 DIAGNOSIS — Z86010 Personal history of colonic polyps: Secondary | ICD-10-CM

## 2017-07-17 MED ORDER — SODIUM CHLORIDE 0.9 % IV SOLN: 500.0000 mL | Freq: Once | INTRAVENOUS | Status: DC

## 2017-07-17 NOTE — Patient Instructions (Signed)
*  Handouts given on polyps and diverticulosis. Repeat colonoscopy in 5 yrs Doctor will use pediatric scope.  YOU HAD AN ENDOSCOPIC PROCEDURE TODAY AT [facility]:   Refer to the procedure report that was given to you for any specific questions about what was found during the examination.  If the procedure report does not answer your questions, please call your gastroenterologist to clarify.  If you requested that your care partner not be given the details of your procedure findings, then the procedure report has been included in a sealed envelope for you to review at your convenience later.  YOU SHOULD EXPECT: Some feelings of bloating in the abdomen. Passage of more gas than usual.  Walking can help get rid of the air that was put into your GI tract during the procedure and reduce the bloating. If you had a lower endoscopy (such as a colonoscopy or flexible sigmoidoscopy) you may notice spotting of blood in your stool or on the toilet paper. If you underwent a bowel prep for your procedure, you may not have a normal bowel movement for a few days.  Please Note:  You might notice some irritation and congestion in your nose or some drainage.  This is from the oxygen used during your procedure.  There is no need for concern and it should clear up in a day or so.  SYMPTOMS TO REPORT IMMEDIATELY:   Following lower endoscopy (colonoscopy or flexible sigmoidoscopy):  Excessive amounts of blood in the stool  Significant tenderness or worsening of abdominal pains  Swelling of the abdomen that is new, acute  Fever of 100F or higher   For urgent or emergent issues, a gastroenterologist can be reached at any hour by calling [phone].   DIET:  We do recommend a small meal at first, but then you may proceed to your regular diet.  Drink plenty of fluids but you should avoid alcoholic beverages for 24 hours.  ACTIVITY:  You should plan to take it easy for the rest of today and you  should NOT DRIVE or use heavy machinery until tomorrow (because of the sedation medicines used during the test).    FOLLOW UP: Our staff will call the number listed on your records the next business day following your procedure to check on you and address any questions or concerns that you may have regarding the information given to you following your procedure. If we do not reach you, we will leave a message.  However, if you are feeling well and you are not experiencing any problems, there is no need to return our call.  We will assume that you have returned to your regular daily activities without incident.  If any biopsies were taken you will be contacted by phone or by letter within the next 1-3 weeks.  Please call us at [phone] if you have not heard about the biopsies in 3 weeks.    SIGNATURES/CONFIDENTIALITY: You and/or your care partner have signed paperwork which will be entered into your electronic medical record.  These signatures attest to the fact that that the information above on your After Visit Summary has been reviewed and is understood.  Full responsibility of the confidentiality of this discharge information lies with you and/or your care-partner.

## 2017-07-17 NOTE — Op Note (Signed)
[facility] Patient Name: Kelsey Sparks Procedure Date: 07/17/2017 9:59 AM MRN: [id number] Endoscopist: Docia Chuck. Henrene Pastor , MD Age: 72 Referring MD:  Date of Birth: 1946-03-10 Gender: Female Account #: [id number] Procedure:                Colonoscopy, with cold snare polypectomy x 1 Indications:              High risk colon cancer surveillance: Personal                            history of multiple (3 or more) adenomas. 2011                            (greater than 10); 2012 (4); 2015 (3) Medicines:                Monitored Anesthesia Care Procedure:                Pre-Anesthesia Assessment:                           - Prior to the procedure, a History and Physical                            was performed, and patient medications and                            allergies were reviewed. The patient's tolerance of                            previous anesthesia was also reviewed. The risks                            and benefits of the procedure and the sedation                            options and risks were discussed with the patient.                            All questions were answered, and informed consent                            was obtained. Prior Anticoagulants: The patient has                            taken no previous anticoagulant or antiplatelet                            agents. ASA Grade Assessment: II - A patient with                            mild systemic disease. After reviewing the risks                            and benefits, the patient was deemed in  satisfactory condition to undergo the procedure.                           After obtaining informed consent, the colonoscope                            was passed under direct vision. Throughout the                            procedure, the patient's blood pressure, pulse, and                            oxygen saturations were monitored continuously. The       Colonoscope was introduced through the anus and                            advanced to the the cecum, identified by                            appendiceal orifice and ileocecal valve. The                            ileocecal valve, appendiceal orifice, and rectum                            were photographed. The quality of the bowel                            preparation was excellent. The colonoscopy was                            performed without difficulty. The patient tolerated                            the procedure well. The bowel preparation used was                            SUPREP. Scope In: 10:14:17 AM Scope Out: 10:38:31 AM Scope Withdrawal Time: 0 hours 16 minutes 59 seconds  Total Procedure Duration: 0 hours 24 minutes 14 seconds  Findings:                 A 2 mm polyp was found in the ascending colon. The                            polyp was removed with a cold snare. Resection and                            retrieval were complete.                           Multiple diverticula were found in the left colon                            and right  colon.                           The exam was otherwise without abnormality on                            direct and retroflexion views. The sigmoid colon                            was somewhat stenotic. Complications:            No immediate complications. Estimated blood loss:                            None. Estimated Blood Loss:     Estimated blood loss: none. Impression:               - One 2 mm polyp in the ascending colon, removed                            with a cold snare. Resected and retrieved.                           - Diverticulosis in the left colon and in the right                            colon.                           - The examination was otherwise normal on direct                            and retroflexion views. Recommendation:           - Repeat colonoscopy in 5 years for surveillance                             (PEDIATRIC SCOPE0.                           - Patient has a contact number available for                            emergencies. The signs and symptoms of potential                            delayed complications were discussed with the                            patient. Return to normal activities tomorrow.                            Written discharge instructions were provided to the                            patient.                           -  Resume previous diet.                           - Continue present medications.                           - Await pathology results. Docia Chuck. Henrene Pastor, MD 07/17/2017 10:42:49 AM This report has been signed electronically.

## 2017-07-17 NOTE — Progress Notes (Signed)
Called to room to assist during endoscopic procedure.  Patient ID and intended procedure confirmed with present staff. Received instructions for my participation in the procedure from the performing physician.  

## 2017-07-17 NOTE — Progress Notes (Signed)
Spontaneous respirations throughout. VSS. Resting comfortably. To PACU on room air. Report to  RN. 

## 2017-07-18 ENCOUNTER — Telehealth: Payer: Self-pay

## 2017-07-18 NOTE — Telephone Encounter (Signed)
  Follow up Call-  Call back number 07/17/2017  Post procedure Call Back phone  # [phone]  Permission to leave phone message Yes  Some recent data might be hidden     Patient questions:  Do you have a fever, pain , or abdominal swelling? No. Pain Score  0 *  Have you tolerated food without any problems? Yes.    Have you been able to return to your normal activities? Yes.    Do you have any questions about your discharge instructions: Diet   No. Medications  No. Follow up visit  No.  Do you have questions or concerns about your Care? No.  Actions: * If pain score is 4 or above: No action needed, pain <4.

## 2017-07-23 ENCOUNTER — Encounter: Payer: Self-pay | Admitting: Internal Medicine

## 2017-08-06 ENCOUNTER — Ambulatory Visit: Payer: Medicare HMO | Admitting: Physical Medicine & Rehabilitation

## 2017-08-08 DIAGNOSIS — Z1231 Encounter for screening mammogram for malignant neoplasm of breast: Secondary | ICD-10-CM | POA: Diagnosis not present

## 2017-08-08 LAB — HM MAMMOGRAPHY

## 2017-08-13 ENCOUNTER — Other Ambulatory Visit: Payer: Self-pay

## 2017-08-13 ENCOUNTER — Encounter: Payer: Medicare HMO | Attending: Physical Medicine & Rehabilitation | Admitting: Physical Medicine & Rehabilitation

## 2017-08-13 ENCOUNTER — Encounter: Payer: Self-pay | Admitting: Physical Medicine & Rehabilitation

## 2017-08-13 VITALS — BP 122/80 | HR 86 | Ht 64.0 in | Wt 164.4 lb

## 2017-08-13 DIAGNOSIS — M7581 Other shoulder lesions, right shoulder: Secondary | ICD-10-CM | POA: Diagnosis not present

## 2017-08-13 DIAGNOSIS — M1711 Unilateral primary osteoarthritis, right knee: Secondary | ICD-10-CM | POA: Diagnosis not present

## 2017-08-13 DIAGNOSIS — M5416 Radiculopathy, lumbar region: Secondary | ICD-10-CM | POA: Diagnosis not present

## 2017-08-13 DIAGNOSIS — I739 Peripheral vascular disease, unspecified: Secondary | ICD-10-CM | POA: Insufficient documentation

## 2017-08-13 DIAGNOSIS — M4726 Other spondylosis with radiculopathy, lumbar region: Secondary | ICD-10-CM | POA: Diagnosis not present

## 2017-08-13 DIAGNOSIS — E785 Hyperlipidemia, unspecified: Secondary | ICD-10-CM | POA: Diagnosis not present

## 2017-08-13 DIAGNOSIS — M961 Postlaminectomy syndrome, not elsewhere classified: Secondary | ICD-10-CM | POA: Insufficient documentation

## 2017-08-13 DIAGNOSIS — M19041 Primary osteoarthritis, right hand: Secondary | ICD-10-CM | POA: Insufficient documentation

## 2017-08-13 DIAGNOSIS — M1712 Unilateral primary osteoarthritis, left knee: Secondary | ICD-10-CM | POA: Diagnosis not present

## 2017-08-13 DIAGNOSIS — M19242 Secondary osteoarthritis, left hand: Secondary | ICD-10-CM | POA: Diagnosis not present

## 2017-08-13 DIAGNOSIS — K449 Diaphragmatic hernia without obstruction or gangrene: Secondary | ICD-10-CM | POA: Insufficient documentation

## 2017-08-13 DIAGNOSIS — M19042 Primary osteoarthritis, left hand: Secondary | ICD-10-CM | POA: Insufficient documentation

## 2017-08-13 DIAGNOSIS — M19241 Secondary osteoarthritis, right hand: Secondary | ICD-10-CM

## 2017-08-13 DIAGNOSIS — M25512 Pain in left shoulder: Secondary | ICD-10-CM | POA: Insufficient documentation

## 2017-08-13 DIAGNOSIS — K589 Irritable bowel syndrome without diarrhea: Secondary | ICD-10-CM | POA: Insufficient documentation

## 2017-08-13 DIAGNOSIS — Z87891 Personal history of nicotine dependence: Secondary | ICD-10-CM | POA: Insufficient documentation

## 2017-08-13 DIAGNOSIS — I10 Essential (primary) hypertension: Secondary | ICD-10-CM | POA: Diagnosis not present

## 2017-08-13 DIAGNOSIS — K219 Gastro-esophageal reflux disease without esophagitis: Secondary | ICD-10-CM | POA: Insufficient documentation

## 2017-08-13 DIAGNOSIS — R42 Dizziness and giddiness: Secondary | ICD-10-CM | POA: Insufficient documentation

## 2017-08-13 DIAGNOSIS — R200 Anesthesia of skin: Secondary | ICD-10-CM | POA: Diagnosis not present

## 2017-08-13 DIAGNOSIS — M4716 Other spondylosis with myelopathy, lumbar region: Secondary | ICD-10-CM

## 2017-08-13 DIAGNOSIS — H3530 Unspecified macular degeneration: Secondary | ICD-10-CM | POA: Diagnosis not present

## 2017-08-13 DIAGNOSIS — G8929 Other chronic pain: Secondary | ICD-10-CM | POA: Diagnosis present

## 2017-08-13 DIAGNOSIS — R202 Paresthesia of skin: Secondary | ICD-10-CM | POA: Diagnosis not present

## 2017-08-13 DIAGNOSIS — M19011 Primary osteoarthritis, right shoulder: Secondary | ICD-10-CM | POA: Diagnosis not present

## 2017-08-13 MED ORDER — TRAMADOL HCL 50 MG PO TABS: ORAL_TABLET | ORAL | 1 refills | Status: DC

## 2017-08-13 MED ORDER — VOLTAREN 1 % TD GEL: 2.0000 g | Freq: Four times a day (QID) | TRANSDERMAL | 4 refills | Status: DC

## 2017-08-13 MED ORDER — GABAPENTIN 100 MG PO CAPS: 100.0000 mg | ORAL_CAPSULE | Freq: Three times a day (TID) | ORAL | 2 refills | Status: DC

## 2017-08-13 NOTE — Patient Instructions (Signed)
TRY TO STRETCH EACH DAY. STRETCH BEFORE AND DURING ACTIVITIES WHEN YOU WILL BE ON YOUR FEET AN EXTENDED PERIOD OF TIME.

## 2017-08-13 NOTE — Progress Notes (Signed)
Subjective:    Patient ID: Kelsey Sparks, female    DOB: 1946/02/21, 72 y.o.   MRN: [id number]  HPI  Mrs. Cureton is here in follow-up of her chronic pain.  I last saw her in February and we injected her right knee.  This seemed to help quite a bit.  She still has moments when the knee swells and causes her pain but it has been much improved in general.  She does report generalized pain in her low back at times when she standing or walking for longer distances.  She admits to not stretching every day like we have talked about before.  She also has had some intermittent right shoulder pain.  She remains on tramadol for breakthrough pain as well as gabapentin for her neuropathic symptoms.  She uses the Voltaren gel daily for her knees and feet.   Pain Inventory Average Pain 6 Pain Right Now 6 My pain is sharp and aching  In the last 24 hours, has pain interfered with the following? General activity 4 Relation with others 0 Enjoyment of life 6 What TIME of day is your pain at its worst? morning evening night Sleep (in general) Good  Pain is worse with: bending, sitting, inactivity, standing and some activites Pain improves with: rest, heat/ice, medication and injections Relief from Meds: 9  Mobility walk without assistance how many minutes can you walk? 3 ability to climb steps?  yes do you drive?  yes transfers alone  Function retired  Neuro/Psych numbness tingling spasms dizziness  Prior Studies Any changes since last visit?  no  Physicians involved in your care Any changes since last visit?  no   Family History  Problem Relation Age of Onset  . Arthritis Father   . Hypertension Father   . Arthritis Mother   . Asthma Mother   . Breast cancer Maternal Aunt   . Colon cancer Neg Hx   . Colon polyps Neg Hx   . Rectal cancer Neg Hx   . Stomach cancer Neg Hx    Social History   Socioeconomic History  . Marital status: Married    Spouse name: Not on file    . Number of children: Not on file  . Years of education: Not on file  . Highest education level: Not on file  Occupational History  . Not on file  Social Needs  . Financial resource strain: Not on file  . Food insecurity:    Worry: Not on file    Inability: Not on file  . Transportation needs:    Medical: Not on file    Non-medical: Not on file  Tobacco Use  . Smoking status: Former Smoker    Packs/day: 0.50    Years: 25.00    Pack years: 12.50    Last attempt to quit: 03/27/2008    Years since quitting: 9.3  . Smokeless tobacco: Never Used  Substance and Sexual Activity  . Alcohol use: No  . Drug use: No  . Sexual activity: Not on file  Lifestyle  . Physical activity:    Days per week: Not on file    Minutes per session: Not on file  . Stress: Not on file  Relationships  . Social connections:    Talks on phone: Not on file    Gets together: Not on file    Attends religious service: Not on file    Active member of club or organization: Not on file    Attends  meetings of clubs or organizations: Not on file    Relationship status: Not on file  Other Topics Concern  . Not on file  Social History Narrative  . Not on file   Past Surgical History:  Procedure Laterality Date  . BACK SURGERY    . CHOLECYSTECTOMY    . COLONOSCOPY    . KNEE SURGERY    . LUMBAR DISC SURGERY    . NECK SURGERY    . POLYPECTOMY    . right small toe surgery    . TOTAL ABDOMINAL HYSTERECTOMY W/ BILATERAL SALPINGOOPHORECTOMY    . UPPER GASTROINTESTINAL ENDOSCOPY     Past Medical History:  Diagnosis Date  . Adenomatous colon polyp   . Allergy   . Anemia   . Arthritis   . Constipation    pt uses metamucil  . Esophageal stricture   . GERD (gastroesophageal reflux disease)   . Hiatal hernia   . Hyperlipidemia   . Hypertension   . IBS (irritable bowel syndrome)   . Low back pain   . Macular degeneration   . Muscular degeneration   . Peripheral vascular disease (HCC)    BP 122/80    Pulse 86   Ht 5\' 4"  (1.626 m)   Wt 164 lb 6.4 oz (74.6 kg)   SpO2 95%   BMI 28.22 kg/m   Opioid Risk Score:   Fall Risk Score:  `1  Depression screen PHQ 2/9  Depression screen [facility] 2/9 08/13/2017 12/13/2016 11/06/2016 03/15/2016 11/24/2015 02/24/2015 10/27/2014  Decreased Interest 0 0 0 0 0 2 0  Down, Depressed, Hopeless 0 0 0 0 0 0 0  PHQ - 2 Score 0 0 0 0 0 2 0  Altered sleeping - - - - - 1 -  Tired, decreased energy - - - - - 2 -  Change in appetite - - - - - 0 -  Feeling bad or failure about yourself  - - - - - 0 -  Trouble concentrating - - - - - 0 -  Moving slowly or fidgety/restless - - - - - 1 -  Suicidal thoughts - - - - - 0 -  PHQ-9 Score - - - - - 6 -  Difficult doing work/chores - - - - - Not difficult at all -     Review of Systems  Constitutional: Positive for unexpected weight change.  Eyes: Negative.   Respiratory: Negative.   Cardiovascular:       Limb swelling  Gastrointestinal: Positive for abdominal pain and constipation.  Endocrine: Negative.   Genitourinary: Negative.   Musculoskeletal: Negative.   Skin: Negative.   Allergic/Immunologic: Negative.   Neurological: Negative.   Hematological: Negative.   Psychiatric/Behavioral: Negative.   All other systems reviewed and are negative.      Objective:   Physical Exam General: No acute distress HEENT: EOMI, oral membranes moist Cards: reg rate  Chest: normal effort Abdomen: Soft, NT, ND Skin: dry, intact Extremities: no edema  Musculoskeletal:  Right knee with mild effusion.  Gait is much improved however.  She is less antalgic and has better weight shift.  He does have a anteriorly tilted pelvis and tends to lean forward during stance and gait.  With cueing she could correct to a certain extent. LB TTP. Neurological:AxO x 3.motor 5/5. Cognition normal Skin: Skin is warmand dry.  Psychiatric: She has a normal mood and affect.         Assessment & Plan:  1. 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  She should use it on days when her pain is more severe -voltaren gel---  name brand rx was refilled -tramadol---continue daily to bid prn--- was refilled today -We will continue the controlled substance monitoring program, this consists of regular clinic visits, examinations, routine drug screening, pill counts as well as use of [place] Controlled Substance Reporting System. [place] today.   -continue ice to   Knee. Exercise as tolerated. Can use heat prior to activity    2. Right Lumbar Radiculitis/spondylosis: Continue Gabapentin.  -Provided Pilates stretches and exercises today  -Discussed the importance of posture as well as building and regular respirations to her activities and keeping up with a scheduled stretching program.  15 minutes of face to face patient care time were spent during this visit. All questions were encouraged and answered. Follow-up visit in 4 months time  Greater than 50% of this visit today was spent in counseling regarding Pilates principles and home exercise program.

## 2017-08-14 ENCOUNTER — Encounter: Payer: Self-pay | Admitting: Family Medicine

## 2017-08-22 ENCOUNTER — Ambulatory Visit: Payer: Medicare HMO | Admitting: Podiatry

## 2017-09-21 ENCOUNTER — Telehealth: Payer: Self-pay | Admitting: Adult Health

## 2017-09-21 NOTE — Telephone Encounter (Signed)
Copied from [place] [id number]. Topic: Quick Communication - See Telephone Encounter >> Sep 21, 2017  9:52 AM Rutherford Nail, NT wrote: CRM for notification. See Telephone encounter for: 09/21/17. Patient calling and is requesting a prescription for the MMR vaccine be sent to her pharmacy.  [id number] [address], [place] [address] AT [place] CB#: [phone]

## 2017-09-21 NOTE — Telephone Encounter (Signed)
If patient is requesting MMR booster or lab work, if any of the following questions are answered YES then no booster is needed. If all answers are NO then an office visit will need to be scheduled to discuss plan.  1. Were you born before [birth year]? YES 2. For those patients considered high risk (college students, healthcare workers, international travelers, and groups at increased risk during outbreaks) do you have documentation of 2 doses of measles and mumps virus - containing vaccine? 3. For all other patients (those not considered high risk) do you have documentation of one dose of measles and mumps virus - containing vaccine?  4. Do you have laboratory confirmation of past infection or had blood tests (titer) that show you are immune to measles, mumps, and rubella?   IF pt still wants to consider MMR vaccine would need provider approval for order.   LMTCB

## 2017-09-21 NOTE — Telephone Encounter (Signed)
Pt says that she has never had MMR vaccine. Pt says that she was told to just request Rx to be sent to pharmacy. I did advise per message below. Pt says that she will just wait until her apt with PCP in Sept to discuss further.

## 2017-09-24 ENCOUNTER — Other Ambulatory Visit: Payer: Self-pay | Admitting: Adult Health

## 2017-09-25 NOTE — Telephone Encounter (Signed)
Sent to the pharmacy by e-scribe. 

## 2017-10-03 ENCOUNTER — Ambulatory Visit (INDEPENDENT_AMBULATORY_CARE_PROVIDER_SITE_OTHER): Payer: Medicare HMO | Admitting: Podiatry

## 2017-10-03 ENCOUNTER — Encounter: Payer: Self-pay | Admitting: Podiatry

## 2017-10-03 ENCOUNTER — Other Ambulatory Visit: Payer: Self-pay | Admitting: Podiatry

## 2017-10-03 ENCOUNTER — Ambulatory Visit (INDEPENDENT_AMBULATORY_CARE_PROVIDER_SITE_OTHER): Payer: Medicare HMO

## 2017-10-03 DIAGNOSIS — M205X1 Other deformities of toe(s) (acquired), right foot: Secondary | ICD-10-CM

## 2017-10-03 DIAGNOSIS — M779 Enthesopathy, unspecified: Secondary | ICD-10-CM

## 2017-10-03 DIAGNOSIS — M2010 Hallux valgus (acquired), unspecified foot: Secondary | ICD-10-CM

## 2017-10-03 MED ORDER — TRIAMCINOLONE ACETONIDE 10 MG/ML IJ SUSP
10.0000 mg | Freq: Once | INTRAMUSCULAR | Status: AC
  Administered 2017-10-03: 10 mg

## 2017-10-04 NOTE — Progress Notes (Signed)
Subjective:   Patient ID: Kelsey Sparks, female   DOB: 72 y.o.   MRN: [id number]   HPI Patient presents stating the big toe joint right has been very tender for the last month and she does not remember injury to the area.  She feels like she is lost some of her motion also in the joint surface   ROS      Objective:  Physical Exam  Neurovascular status intact negative Homans sign was noted with patient found to have inflammation pain around the first MPJ of the right foot with fluid buildup around the joint pain and discomfort with palpation deep into the joint surface     Assessment:  Inflammatory capsulitis first MPJ with probability for moderate hallux limitus condition     Plan:  H&P and x-rays reviewed with patient.  I discussed hallux limitus and the possibility at one point that this may require surgical intervention but at this point we are to treat inflammatory wise and I did a sterile prep of the right first MPJ and injected the joint with 3 mg Kenalog 5 mg Xylocaine advised on anti-inflammatory stretching exercises and supportive shoes and will be seen back as symptoms indicate  X-rays indicate there is inflammation and narrowing of the joint surface but minimal spurring of the first metatarsal head right foot

## 2017-11-01 DIAGNOSIS — G43019 Migraine without aura, intractable, without status migrainosus: Secondary | ICD-10-CM | POA: Diagnosis not present

## 2017-11-10 IMAGING — CR DG KNEE COMPLETE 4+V*R*
4 series · 4 of 4 positions shown · non-contrast
Comparison: None.

CLINICAL DATA: Two week history of pain. No history of recent
trauma

EXAM:
RIGHT KNEE - COMPLETE 4+ VIEW

[w knee ap right]
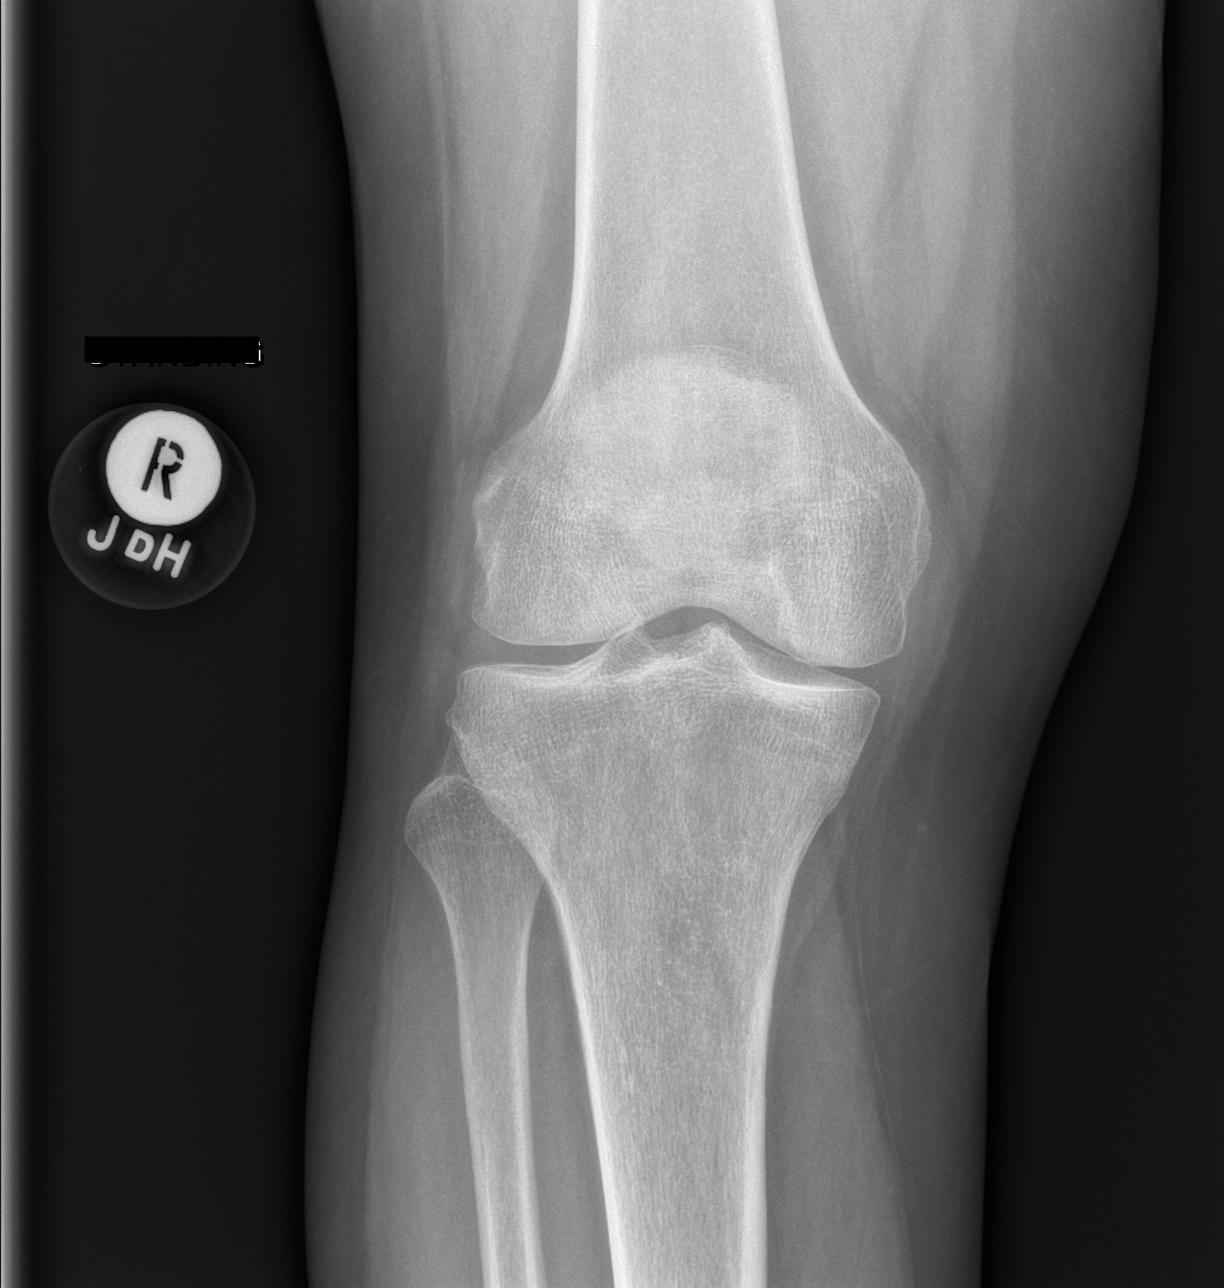

[w knee lat right]
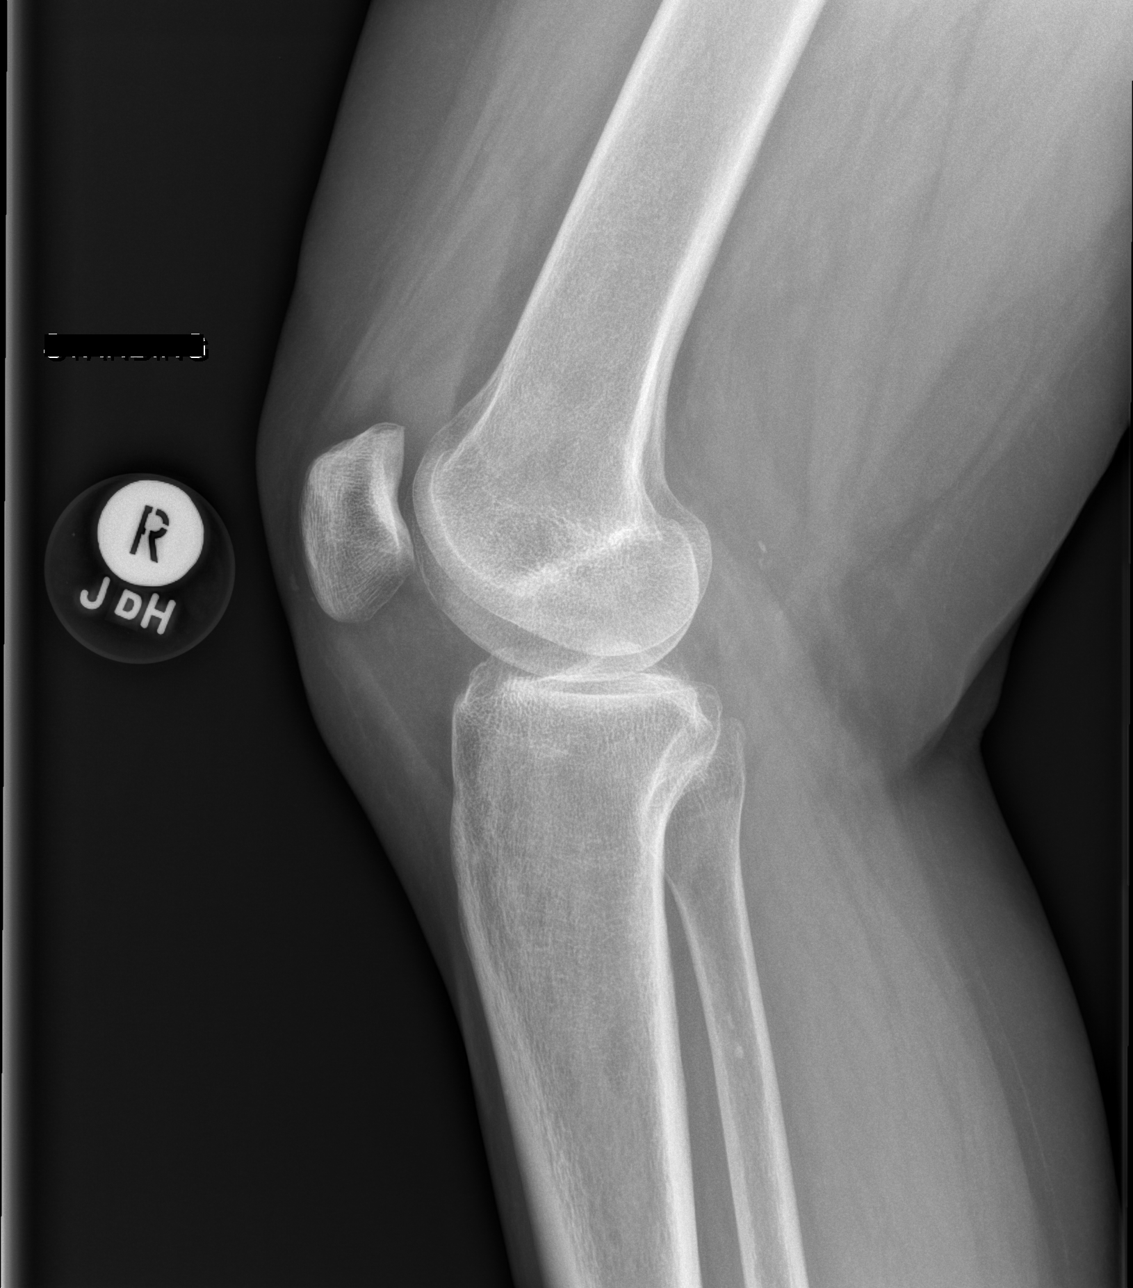

[x knee tunnel right]
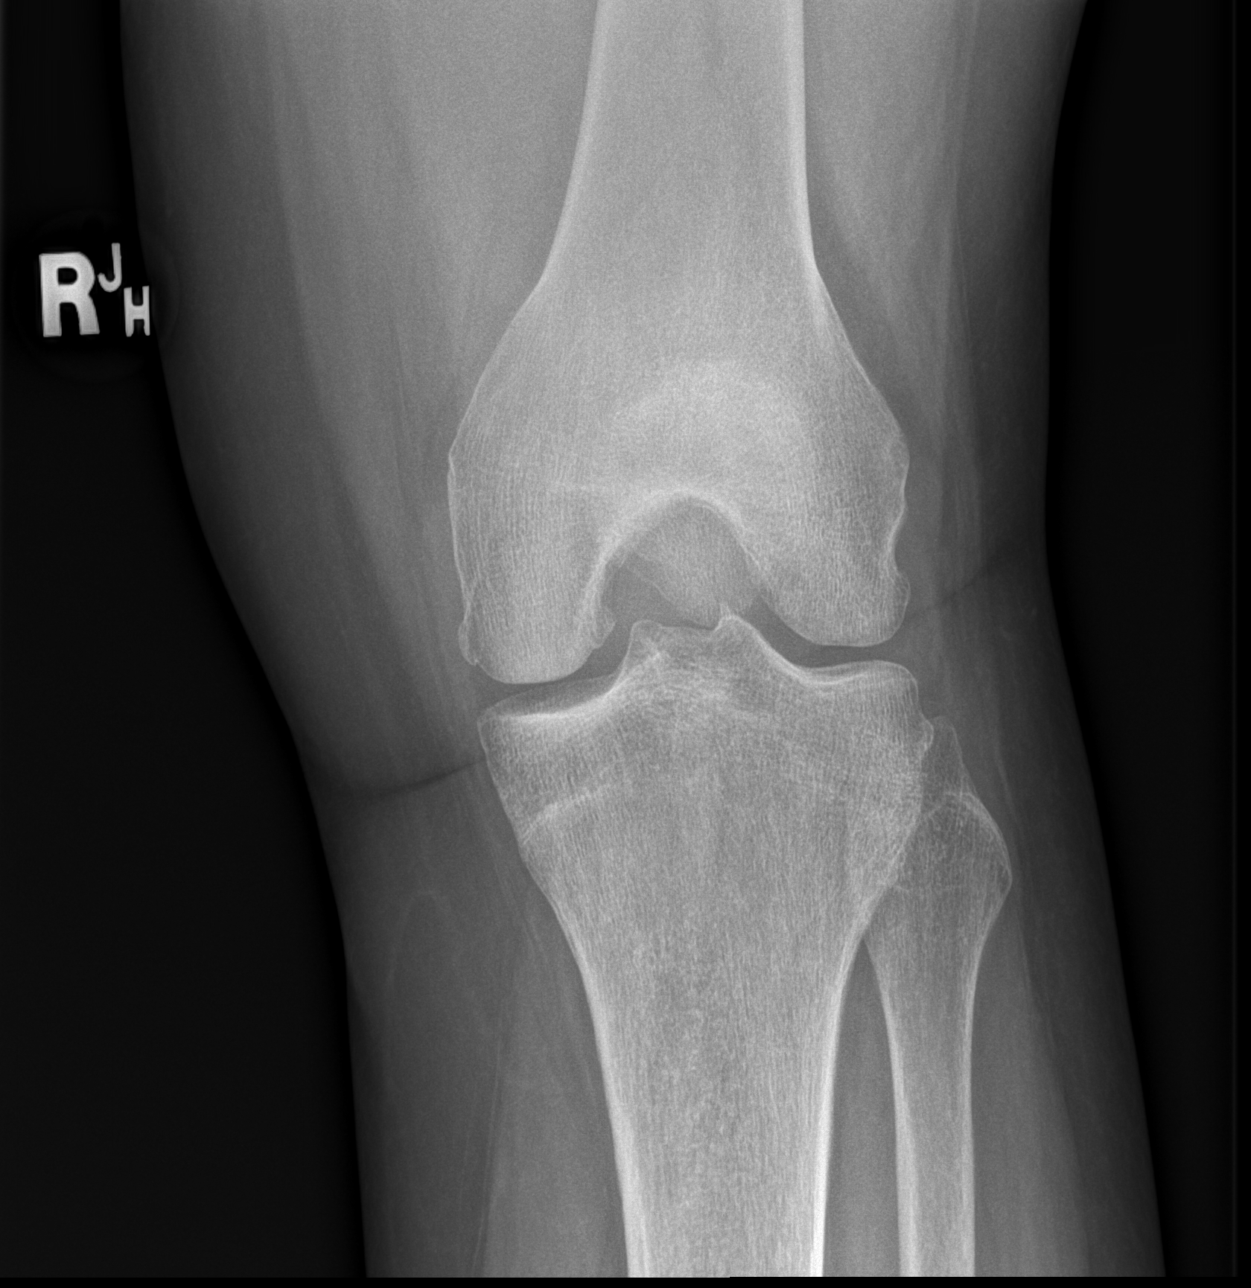

[x knee sunrise right]
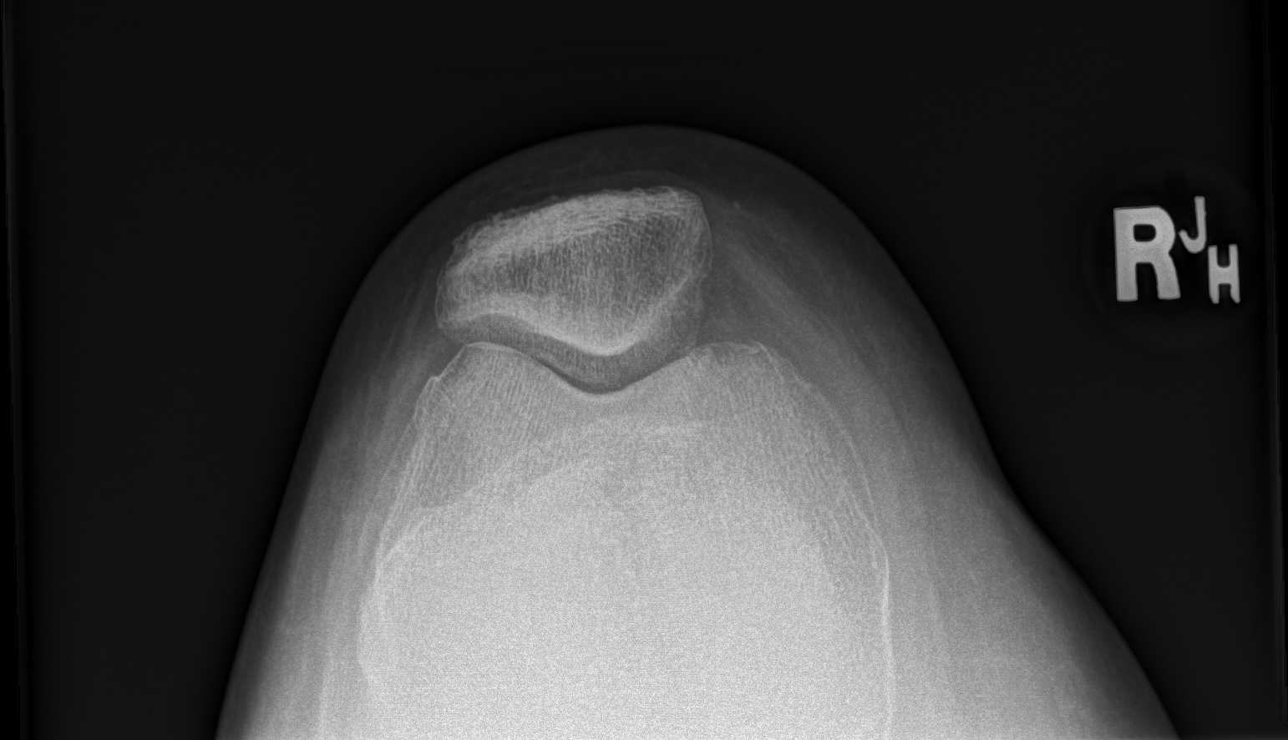

[4 of 4 positions shown; findings below may reference images not displayed]

FINDINGS: Standing frontal, standing lateral, tunnel, and sunrise patellar
images were obtained. There is no fracture or dislocation. There is
a focal joint effusion. The joint spaces appear unremarkable. No
erosive change.
IMPRESSION: There is a joint effusion. No fracture or dislocation. No
appreciable joint space narrowing or erosion.

## 2017-12-04 ENCOUNTER — Ambulatory Visit (INDEPENDENT_AMBULATORY_CARE_PROVIDER_SITE_OTHER): Payer: Medicare HMO | Admitting: Adult Health

## 2017-12-04 ENCOUNTER — Encounter: Payer: Self-pay | Admitting: Adult Health

## 2017-12-04 VITALS — BP 134/72 | Temp 98.3°F | Wt 164.0 lb

## 2017-12-04 DIAGNOSIS — L509 Urticaria, unspecified: Secondary | ICD-10-CM | POA: Diagnosis not present

## 2017-12-04 MED ORDER — PREDNISONE 10 MG PO TABS: ORAL_TABLET | ORAL | 0 refills | Status: DC

## 2017-12-04 NOTE — Progress Notes (Signed)
Subjective:    Patient ID: Kelsey Sparks, female    DOB: 1945/11/04, 72 y.o.   MRN: [id number]  HPI  72 year old female who  has a past medical history of Adenomatous colon polyp, Allergy, Anemia, Arthritis, Constipation, Esophageal stricture, GERD (gastroesophageal reflux disease), Hiatal hernia, Hyperlipidemia, Hypertension, IBS (irritable bowel syndrome), Low back pain, Macular degeneration, Muscular degeneration, and Peripheral vascular disease ([place]).  She presents to the office today for rash and itching. Her symptoms have been present for 2 weeks. Reports itching and rash on bilateral arms, lower back, and legs. She has been using Benadryl, cortisone, and anti itch cream, nothing has helped with her symptoms/   She denies working out side, changing detergents, or soaps.    Review of Systems See HPI   Past Medical History:  Diagnosis Date  . Adenomatous colon polyp   . Allergy   . Anemia   . Arthritis   . Constipation    pt uses metamucil  . Esophageal stricture   . GERD (gastroesophageal reflux disease)   . Hiatal hernia   . Hyperlipidemia   . Hypertension   . IBS (irritable bowel syndrome)   . Low back pain   . Macular degeneration   . Muscular degeneration   . Peripheral vascular disease ([place])     Social History   Socioeconomic History  . Marital status: Married    Spouse name: Not on file  . Number of children: Not on file  . Years of education: Not on file  . Highest education level: Not on file  Occupational History  . Not on file  Social Needs  . Financial resource strain: Not on file  . Food insecurity:    Worry: Not on file    Inability: Not on file  . Transportation needs:    Medical: Not on file    Non-medical: Not on file  Tobacco Use  . Smoking status: Former Smoker    Packs/day: 0.50    Years: 25.00    Pack years: 12.50    Last attempt to quit: 03/27/2008    Years since quitting: 9.6  . Smokeless tobacco: Never Used  Substance and  Sexual Activity  . Alcohol use: No  . Drug use: No  . Sexual activity: Not on file  Lifestyle  . Physical activity:    Days per week: Not on file    Minutes per session: Not on file  . Stress: Not on file  Relationships  . Social connections:    Talks on phone: Not on file    Gets together: Not on file    Attends religious service: Not on file    Active member of club or organization: Not on file    Attends meetings of clubs or organizations: Not on file    Relationship status: Not on file  . Intimate partner violence:    Fear of current or ex partner: Not on file    Emotionally abused: Not on file    Physically abused: Not on file    Forced sexual activity: Not on file  Other Topics Concern  . Not on file  Social History Narrative  . Not on file    Past Surgical History:  Procedure Laterality Date  . BACK SURGERY    . CHOLECYSTECTOMY    . COLONOSCOPY    . KNEE SURGERY    . LUMBAR DISC SURGERY    . NECK SURGERY    . POLYPECTOMY    .  right small toe surgery    . TOTAL ABDOMINAL HYSTERECTOMY W/ BILATERAL SALPINGOOPHORECTOMY    . UPPER GASTROINTESTINAL ENDOSCOPY      Family History  Problem Relation Age of Onset  . Arthritis Father   . Hypertension Father   . Arthritis Mother   . Asthma Mother   . Breast cancer Maternal Aunt   . Colon cancer Neg Hx   . Colon polyps Neg Hx   . Rectal cancer Neg Hx   . Stomach cancer Neg Hx     No Known Allergies  Current Outpatient Medications on File Prior to Visit  Medication Sig Dispense Refill  . aspirin 81 MG tablet Take 81 mg by mouth daily.    Marland Kitchen atenolol (TENORMIN) 50 MG tablet Take 1 tablet (50 mg total) by mouth every morning. 100 tablet 3  . baclofen (LIORESAL) 10 MG tablet Take 10 mg by mouth 2 (two) times daily.    . cetirizine (ZYRTEC) 10 MG tablet Take 10 mg by mouth daily.    . cyclobenzaprine (FLEXERIL) 10 MG tablet take 1 tablet by mouth at bedtime if needed 90 tablet 1  . fluticasone (FLONASE) 50 MCG/ACT  nasal spray instill 2 sprays into each nostril once daily 48 g 3  . gabapentin (NEURONTIN) 100 MG capsule Take 1 capsule (100 mg total) by mouth 3 (three) times daily. 270 capsule 2  . hydrocortisone (ANUSOL-HC) 25 MG suppository Place 1 suppository (25 mg total) rectally 2 (two) times daily as needed for hemorrhoids or itching. 30 suppository 6  . meclizine (ANTIVERT) 25 MG tablet TAKE 1/2 TO 1 TABLET EVERY 6 HOURS IF NEEDED 40 tablet 0  . meloxicam (MOBIC) 15 MG tablet TAKE 1 TABLET EVERY DAY 90 tablet 3  . Polyethyl Glycol-Propyl Glycol (SYSTANE) 0.4-0.3 % SOLN Apply 2 drops to eye 2 (two) times daily. 4 mL 3  . RABEprazole (ACIPHEX) 20 MG tablet TAKE 1 TABLET EVERY DAY 90 tablet 1  . simvastatin (ZOCOR) 40 MG tablet TAKE 1 TABLET EVERY DAY 90 tablet 1  . traMADol (ULTRAM) 50 MG tablet take 1-2 tablets by mouth every 8 hours if needed 405 tablet 1  . triamterene-hydrochlorothiazide (MAXZIDE-25) 37.5-25 MG tablet TAKE 1 TABLET BY MOUTH EVERY MORNING 90 tablet 0  . VOLTAREN 1 % GEL Apply 2 g topically 4 (four) times daily. 3 Tube 4  . zonisamide (ZONEGRAN) 25 MG capsule Take 50 mg by mouth daily. Increase as directed  0  . [DISCONTINUED] ferrous sulfate 325 (65 FE) MG tablet Take 325 mg by mouth daily with breakfast.       Current Facility-Administered Medications on File Prior to Visit  Medication Dose Route Frequency Provider Last Rate Last Dose  . 0.9 %  sodium chloride infusion  500 mL Intravenous Once Irene Shipper, MD        BP 134/72   Temp 98.3 F (36.8 C) (Oral)   Wt 164 lb (74.4 kg)   BMI 28.15 kg/m       Objective:   Physical Exam  Constitutional: She is oriented to person, place, and time. She appears well-developed and well-nourished. No distress.  Cardiovascular: Normal rate, regular rhythm, normal heart sounds and intact distal pulses.  Pulmonary/Chest: Effort normal and breath sounds normal.  Neurological: She is alert and oriented to person, place, and time.  Skin:  Rash noted. Rash is urticarial. She is not diaphoretic. There is erythema.  uticarial rash noted on bilateral arms, low back and bilateral legs.  Psychiatric: She has a normal mood and affect. Her behavior is normal. Judgment and thought content normal.  Nursing note and vitals reviewed.     Assessment & Plan:  1. Hives - predniSONE (DELTASONE) 10 MG tablet; 40 mg x 3 days, 20 mg x 3 days, 10 mg x 3 days  Dispense: 21 tablet; Refill: 0 - Follow up if no improvement in the next 2-3 days   Dorothyann Peng, NP

## 2017-12-05 ENCOUNTER — Other Ambulatory Visit: Payer: Self-pay | Admitting: Adult Health

## 2017-12-05 DIAGNOSIS — K219 Gastro-esophageal reflux disease without esophagitis: Secondary | ICD-10-CM

## 2017-12-07 NOTE — Telephone Encounter (Signed)
Sent to the pharmacy by e-scribe.  Pt has appt for cpx on 12/18/17.

## 2017-12-10 ENCOUNTER — Ambulatory Visit: Payer: Medicare HMO | Admitting: Physical Medicine & Rehabilitation

## 2017-12-11 ENCOUNTER — Encounter: Payer: Medicare HMO | Attending: Physical Medicine & Rehabilitation | Admitting: Physical Medicine & Rehabilitation

## 2017-12-11 ENCOUNTER — Encounter: Payer: Self-pay | Admitting: Physical Medicine & Rehabilitation

## 2017-12-11 VITALS — BP 139/87 | HR 64 | Ht 64.0 in | Wt 163.8 lb

## 2017-12-11 DIAGNOSIS — M170 Bilateral primary osteoarthritis of knee: Secondary | ICD-10-CM | POA: Insufficient documentation

## 2017-12-11 DIAGNOSIS — M1711 Unilateral primary osteoarthritis, right knee: Secondary | ICD-10-CM | POA: Diagnosis not present

## 2017-12-11 DIAGNOSIS — Z8719 Personal history of other diseases of the digestive system: Secondary | ICD-10-CM | POA: Diagnosis not present

## 2017-12-11 DIAGNOSIS — M5416 Radiculopathy, lumbar region: Secondary | ICD-10-CM

## 2017-12-11 DIAGNOSIS — G8929 Other chronic pain: Secondary | ICD-10-CM | POA: Diagnosis not present

## 2017-12-11 DIAGNOSIS — H3530 Unspecified macular degeneration: Secondary | ICD-10-CM | POA: Diagnosis not present

## 2017-12-11 DIAGNOSIS — Z5181 Encounter for therapeutic drug level monitoring: Secondary | ICD-10-CM | POA: Diagnosis not present

## 2017-12-11 DIAGNOSIS — Z803 Family history of malignant neoplasm of breast: Secondary | ICD-10-CM | POA: Diagnosis not present

## 2017-12-11 DIAGNOSIS — M4716 Other spondylosis with myelopathy, lumbar region: Secondary | ICD-10-CM

## 2017-12-11 DIAGNOSIS — Z8249 Family history of ischemic heart disease and other diseases of the circulatory system: Secondary | ICD-10-CM | POA: Insufficient documentation

## 2017-12-11 DIAGNOSIS — Z79899 Other long term (current) drug therapy: Secondary | ICD-10-CM | POA: Diagnosis not present

## 2017-12-11 DIAGNOSIS — M25552 Pain in left hip: Secondary | ICD-10-CM | POA: Insufficient documentation

## 2017-12-11 DIAGNOSIS — M19241 Secondary osteoarthritis, right hand: Secondary | ICD-10-CM

## 2017-12-11 DIAGNOSIS — Z8261 Family history of arthritis: Secondary | ICD-10-CM | POA: Diagnosis not present

## 2017-12-11 DIAGNOSIS — Z9049 Acquired absence of other specified parts of digestive tract: Secondary | ICD-10-CM | POA: Diagnosis not present

## 2017-12-11 DIAGNOSIS — M19242 Secondary osteoarthritis, left hand: Secondary | ICD-10-CM

## 2017-12-11 DIAGNOSIS — Z9889 Other specified postprocedural states: Secondary | ICD-10-CM | POA: Insufficient documentation

## 2017-12-11 DIAGNOSIS — M1712 Unilateral primary osteoarthritis, left knee: Secondary | ICD-10-CM

## 2017-12-11 DIAGNOSIS — R42 Dizziness and giddiness: Secondary | ICD-10-CM | POA: Insufficient documentation

## 2017-12-11 DIAGNOSIS — Z90710 Acquired absence of both cervix and uterus: Secondary | ICD-10-CM | POA: Insufficient documentation

## 2017-12-11 DIAGNOSIS — Z87891 Personal history of nicotine dependence: Secondary | ICD-10-CM | POA: Diagnosis not present

## 2017-12-11 MED ORDER — VOLTAREN 1 % TD GEL: 2.0000 g | Freq: Four times a day (QID) | TRANSDERMAL | 6 refills | Status: DC

## 2017-12-11 MED ORDER — GABAPENTIN 100 MG PO CAPS: 100.0000 mg | ORAL_CAPSULE | Freq: Three times a day (TID) | ORAL | 2 refills | Status: DC

## 2017-12-11 NOTE — Progress Notes (Signed)
Subjective:    Patient ID: Kelsey Sparks, female    DOB: March 02, 1946, 72 y.o.   MRN: [id number]  HPI   Kelsey Sparks is here in follow up of her chronic pain. Her husband passed this summer which has been adjustment for her, but she's taking things "a day at a time."   She has been going back to the gym since her husband passed which is good for her. She has noticed some pain in her anterior left thigh when she first stands up or flexes her left hip.   She remains on tramadol for her back which seems to do the trick for her.  She is pleased with her back pain levels at this point.  She is using gabapentin for referred pain into her legs which is also quite helpful.   Pain Inventory Average Pain 6 Pain Right Now 7 My pain is sharp, burning and aching  In the last 24 hours, has pain interfered with the following? General activity 1 Relation with others 0 Enjoyment of life 1 What TIME of day is your pain at its worst? night Sleep (in general) Fair  Pain is worse with: walking, bending, inactivity and standing Pain improves with: rest, heat/ice, therapy/exercise, medication and injections Relief from Meds: 7  Mobility walk without assistance ability to climb steps?  yes do you drive?  yes  Function retired  Neuro/Psych numbness dizziness  Prior Studies Any changes since last visit?  no  Physicians involved in your care Any changes since last visit?  no   Family History  Problem Relation Age of Onset  . Arthritis Father   . Hypertension Father   . Arthritis Mother   . Asthma Mother   . Breast cancer Maternal Aunt   . Colon cancer Neg Hx   . Colon polyps Neg Hx   . Rectal cancer Neg Hx   . Stomach cancer Neg Hx    Social History   Socioeconomic History  . Marital status: Married    Spouse name: Not on file  . Number of children: Not on file  . Years of education: Not on file  . Highest education level: Not on file  Occupational History  . Not on file    Social Needs  . Financial resource strain: Not on file  . Food insecurity:    Worry: Not on file    Inability: Not on file  . Transportation needs:    Medical: Not on file    Non-medical: Not on file  Tobacco Use  . Smoking status: Former Smoker    Packs/day: 0.50    Years: 25.00    Pack years: 12.50    Last attempt to quit: 03/27/2008    Years since quitting: 9.7  . Smokeless tobacco: Never Used  Substance and Sexual Activity  . Alcohol use: No  . Drug use: No  . Sexual activity: Not on file  Lifestyle  . Physical activity:    Days per week: Not on file    Minutes per session: Not on file  . Stress: Not on file  Relationships  . Social connections:    Talks on phone: Not on file    Gets together: Not on file    Attends religious service: Not on file    Active member of club or organization: Not on file    Attends meetings of clubs or organizations: Not on file    Relationship status: Not on file  Other Topics Concern  .  Not on file  Social History Narrative  . Not on file   Past Surgical History:  Procedure Laterality Date  . BACK SURGERY    . CHOLECYSTECTOMY    . COLONOSCOPY    . KNEE SURGERY    . LUMBAR DISC SURGERY    . NECK SURGERY    . POLYPECTOMY    . right small toe surgery    . TOTAL ABDOMINAL HYSTERECTOMY W/ BILATERAL SALPINGOOPHORECTOMY    . UPPER GASTROINTESTINAL ENDOSCOPY     Past Medical History:  Diagnosis Date  . Adenomatous colon polyp   . Allergy   . Anemia   . Arthritis   . Constipation    pt uses metamucil  . Esophageal stricture   . GERD (gastroesophageal reflux disease)   . Hiatal hernia   . Hyperlipidemia   . Hypertension   . IBS (irritable bowel syndrome)   . Low back pain   . Macular degeneration   . Muscular degeneration   . Peripheral vascular disease ([place])    There were no vitals taken for this visit.  Opioid Risk Score:   Fall Risk Score:  `1  Depression screen PHQ 2/9  Depression screen [facility] 2/9 08/13/2017  12/13/2016 11/06/2016 03/15/2016 11/24/2015 02/24/2015 10/27/2014  Decreased Interest 0 0 0 0 0 2 0  Down, Depressed, Hopeless 0 0 0 0 0 0 0  PHQ - 2 Score 0 0 0 0 0 2 0  Altered sleeping - - - - - 1 -  Tired, decreased energy - - - - - 2 -  Change in appetite - - - - - 0 -  Feeling bad or failure about yourself  - - - - - 0 -  Trouble concentrating - - - - - 0 -  Moving slowly or fidgety/restless - - - - - 1 -  Suicidal thoughts - - - - - 0 -  PHQ-9 Score - - - - - 6 -  Difficult doing work/chores - - - - - Not difficult at all -     Review of Systems  Constitutional: Positive for diaphoresis and unexpected weight change.  HENT: Negative.   Eyes: Negative.   Respiratory: Negative.   Cardiovascular: Negative.   Gastrointestinal: Positive for constipation.  Endocrine: Negative.   Genitourinary: Negative.   Musculoskeletal: Positive for arthralgias, joint swelling and myalgias.  Skin: Positive for rash.  Allergic/Immunologic: Negative.   Neurological: Positive for dizziness and numbness.  Hematological: Negative.   Psychiatric/Behavioral: Negative.   All other systems reviewed and are negative.      Objective:   Physical Exam  General: No acute distress HEENT: EOMI, oral membranes moist Cards: reg rate  Chest: normal effort Abdomen: Soft, NT, ND Skin: dry, intact Extremities: no edema   Musculoskeletal:  Right knee with continued effusion.  gait is stable.  She is less antalgic and has better weight shift.  LB TTP.  Mild tenderness over the mid quadricep muscle on left Neurological:AxO x 3.motor 5/5. Cognition normal Skin: intact Psychiatric: normal          Assessment & Plan:  1. 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 [place] today.    -  UDS today -continueice to Knee. Exercise as tolerated. Can use heat prior to activity   2. Right Lumbar Radiculitis/spondylosis: Continue Gabapentin which was refilled today.      -continue with Pilates stretches and exercises today            -looks to have mild quad strain---exercises described and provided   62minutes of face to face patient care time were spent during this visit. All questions were encouraged and answered. Follow-up visit in 3 months time.  Greater than 50% of this visit was spent in consultation regarding home exercise program and modalities to address pain

## 2017-12-16 LAB — TOXASSURE SELECT,+ANTIDEPR,UR

## 2017-12-17 ENCOUNTER — Telehealth: Payer: Self-pay | Admitting: *Deleted

## 2017-12-17 NOTE — Telephone Encounter (Signed)
Urine drug screen for this encounter is consistent for prescribed medication 

## 2017-12-18 ENCOUNTER — Encounter: Payer: Self-pay | Admitting: Adult Health

## 2017-12-18 ENCOUNTER — Ambulatory Visit (INDEPENDENT_AMBULATORY_CARE_PROVIDER_SITE_OTHER): Payer: Medicare HMO | Admitting: Podiatry

## 2017-12-18 ENCOUNTER — Encounter: Payer: Self-pay | Admitting: Podiatry

## 2017-12-18 ENCOUNTER — Other Ambulatory Visit: Payer: Self-pay | Admitting: *Deleted

## 2017-12-18 ENCOUNTER — Ambulatory Visit (INDEPENDENT_AMBULATORY_CARE_PROVIDER_SITE_OTHER): Payer: Medicare HMO | Admitting: Adult Health

## 2017-12-18 VITALS — BP 124/78 | HR 82 | Temp 97.9°F | Ht 64.5 in | Wt 162.4 lb

## 2017-12-18 DIAGNOSIS — E785 Hyperlipidemia, unspecified: Secondary | ICD-10-CM | POA: Diagnosis not present

## 2017-12-18 DIAGNOSIS — M79676 Pain in unspecified toe(s): Secondary | ICD-10-CM | POA: Diagnosis not present

## 2017-12-18 DIAGNOSIS — K219 Gastro-esophageal reflux disease without esophagitis: Secondary | ICD-10-CM | POA: Diagnosis not present

## 2017-12-18 DIAGNOSIS — Z0184 Encounter for antibody response examination: Secondary | ICD-10-CM | POA: Diagnosis not present

## 2017-12-18 DIAGNOSIS — I10 Essential (primary) hypertension: Secondary | ICD-10-CM

## 2017-12-18 DIAGNOSIS — Z0000 Encounter for general adult medical examination without abnormal findings: Secondary | ICD-10-CM | POA: Diagnosis not present

## 2017-12-18 DIAGNOSIS — B351 Tinea unguium: Secondary | ICD-10-CM | POA: Diagnosis not present

## 2017-12-18 LAB — HEPATIC FUNCTION PANEL
ALT: 12 U/L (ref 0–35)
AST: 15 U/L (ref 0–37)
Albumin: 4.2 g/dL (ref 3.5–5.2)
Alkaline Phosphatase: 96 U/L (ref 39–117)
BILIRUBIN TOTAL: 0.6 mg/dL (ref 0.2–1.2)
Bilirubin, Direct: 0.1 mg/dL (ref 0.0–0.3)
Total Protein: 7.1 g/dL (ref 6.0–8.3)

## 2017-12-18 LAB — CBC WITH DIFFERENTIAL/PLATELET
Basophils Absolute: 0.1 10*3/uL (ref 0.0–0.1)
Basophils Relative: 1.1 % (ref 0.0–3.0)
Eosinophils Absolute: 0.3 10*3/uL (ref 0.0–0.7)
Eosinophils Relative: 3.3 % (ref 0.0–5.0)
HCT: 35.4 % — ABNORMAL LOW (ref 36.0–46.0)
Hemoglobin: 12 g/dL (ref 12.0–15.0)
LYMPHS PCT: 16.2 % (ref 12.0–46.0)
Lymphs Abs: 1.4 10*3/uL (ref 0.7–4.0)
MCHC: 33.8 g/dL (ref 30.0–36.0)
MCV: 89.8 fl (ref 78.0–100.0)
MONO ABS: 0.8 10*3/uL (ref 0.1–1.0)
Monocytes Relative: 8.8 % (ref 3.0–12.0)
NEUTROS PCT: 70.6 % (ref 43.0–77.0)
Neutro Abs: 6.3 10*3/uL (ref 1.4–7.7)
Platelets: 176 10*3/uL (ref 150.0–400.0)
RBC: 3.93 Mil/uL (ref 3.87–5.11)
RDW: 15 % (ref 11.5–15.5)
WBC: 8.9 10*3/uL (ref 4.0–10.5)

## 2017-12-18 LAB — BASIC METABOLIC PANEL
BUN: 20 mg/dL (ref 6–23)
CO2: 30 meq/L (ref 19–32)
CREATININE: 1.52 mg/dL — AB (ref 0.40–1.20)
Calcium: 9.3 mg/dL (ref 8.4–10.5)
Chloride: 101 mEq/L (ref 96–112)
GFR: 43.46 mL/min — AB (ref >60.00)
Glucose, Bld: 96 mg/dL (ref 70–99)
Potassium: 3.7 mEq/L (ref 3.5–5.1)
SODIUM: 140 meq/L (ref 135–145)

## 2017-12-18 LAB — LIPID PANEL
CHOL/HDL RATIO: 5
CHOLESTEROL: 206 mg/dL — AB (ref 0–200)
HDL: 38.5 mg/dL — ABNORMAL LOW (ref >39.00)
NonHDL: 167.99
TRIGLYCERIDES: 270 mg/dL — AB (ref 0.0–149.0)
VLDL: 54 mg/dL — AB (ref 0.0–40.0)

## 2017-12-18 LAB — LDL CHOLESTEROL, DIRECT: Direct LDL: 120 mg/dL

## 2017-12-18 LAB — TSH: TSH: 1.18 u[IU]/mL (ref 0.35–4.50)

## 2017-12-18 MED ORDER — ATORVASTATIN CALCIUM 40 MG PO TABS: 40.0000 mg | ORAL_TABLET | Freq: Every day | ORAL | 3 refills | Status: DC

## 2017-12-18 NOTE — Progress Notes (Signed)
Subjective:    Patient ID: Kelsey Sparks, female    DOB: 1945/09/04, 72 y.o.   MRN: [id number]  HPI  Patient presents for yearly preventative medicine examination. She is a pleasant 72 year old female who  has a past medical history of Adenomatous colon polyp, Allergy, Anemia, Arthritis, Constipation, Esophageal stricture, GERD (gastroesophageal reflux disease), Hiatal hernia, Hyperlipidemia, Hypertension, IBS (irritable bowel syndrome), Low back pain, Macular degeneration, Muscular degeneration, and Peripheral vascular disease ([place]).   Essential Hypertension - Takes Maxide 37.5-25 mg and Atenolol. Denies any chest pain, SOB, headaches, blurred vision, or dizziness  BP Readings from Last 3 Encounters:  12/18/17 124/78  12/11/17 139/87  12/04/17 134/72   GERD - Controlled with PPI   Hyperlipidemia - Takes Zocor 40 mg and 81 mg ASA Lab Results  Component Value Date   CHOL 192 12/13/2016   HDL 51.50 12/13/2016   LDLCALC 104 (H) 12/13/2016   LDLDIRECT 109.0 12/07/2015   TRIG 182.0 (H) 12/13/2016   CHOLHDL 4 12/13/2016   Chronic Pain - Takes Gabpentin and Tramadol PRN - Is seen by Dr. Naaman Plummer   Migraines - Is [facility] at [facility] - Dr. Domingo Cocking. Currently prescribed Zonegran and Baclofen. Has not had any recent headaches.   All immunizations and health maintenance protocols were reviewed with the patient and needed orders were placed. Vaccinations are UTD   Appropriate screening laboratory values were ordered for the patient including screening of hyperlipidemia, renal function and hepatic function.  Medication reconciliation,  past medical history, social history, problem list and allergies were reviewed in detail with the patient  Goals were established with regard to weight loss, exercise, and  diet in compliance with medications  End of life planning was discussed. She has an advanced directive and living will  She is up to date on routine colonoscopy,  mammogram, dental and vision screens.   She has no acute complaints but would like to make sure she has immunity to measles as her pharmacy wants her to get a measles vaccination  Review of Systems  Constitutional: Negative.   HENT: Negative.   Eyes: Negative.   Respiratory: Negative.   Cardiovascular: Negative.   Gastrointestinal: Negative.   Endocrine: Negative.   Genitourinary: Negative.   Musculoskeletal: Negative.   Skin: Negative.   Allergic/Immunologic: Negative.   Neurological: Negative.   Hematological: Negative.   Psychiatric/Behavioral: Negative.    Past Medical History:  Diagnosis Date  . Adenomatous colon polyp   . Allergy   . Anemia   . Arthritis   . Constipation    pt uses metamucil  . Esophageal stricture   . GERD (gastroesophageal reflux disease)   . Hiatal hernia   . Hyperlipidemia   . Hypertension   . IBS (irritable bowel syndrome)   . Low back pain   . Macular degeneration   . Muscular degeneration   . Peripheral vascular disease ([place])     Social History   Socioeconomic History  . Marital status: Married    Spouse name: Not on file  . Number of children: Not on file  . Years of education: Not on file  . Highest education level: Not on file  Occupational History  . Not on file  Social Needs  . Financial resource strain: Not on file  . Food insecurity:    Worry: Not on file    Inability: Not on file  . Transportation needs:    Medical: Not on file    Non-medical: Not  on file  Tobacco Use  . Smoking status: Former Smoker    Packs/day: 0.50    Years: 25.00    Pack years: 12.50    Last attempt to quit: 03/27/2008    Years since quitting: 9.7  . Smokeless tobacco: Never Used  Substance and Sexual Activity  . Alcohol use: No  . Drug use: No  . Sexual activity: Not on file  Lifestyle  . Physical activity:    Days per week: Not on file    Minutes per session: Not on file  . Stress: Not on file  Relationships  . Social connections:      Talks on phone: Not on file    Gets together: Not on file    Attends religious service: Not on file    Active member of club or organization: Not on file    Attends meetings of clubs or organizations: Not on file    Relationship status: Not on file  . Intimate partner violence:    Fear of current or ex partner: Not on file    Emotionally abused: Not on file    Physically abused: Not on file    Forced sexual activity: Not on file  Other Topics Concern  . Not on file  Social History Narrative  . Not on file    Past Surgical History:  Procedure Laterality Date  . BACK SURGERY    . CHOLECYSTECTOMY    . COLONOSCOPY    . KNEE SURGERY    . LUMBAR DISC SURGERY    . NECK SURGERY    . POLYPECTOMY    . right small toe surgery    . TOTAL ABDOMINAL HYSTERECTOMY W/ BILATERAL SALPINGOOPHORECTOMY    . UPPER GASTROINTESTINAL ENDOSCOPY      Family History  Problem Relation Age of Onset  . Arthritis Father   . Hypertension Father   . Arthritis Mother   . Asthma Mother   . Breast cancer Maternal Aunt   . Colon cancer Neg Hx   . Colon polyps Neg Hx   . Rectal cancer Neg Hx   . Stomach cancer Neg Hx     No Known Allergies  Current Outpatient Medications on File Prior to Visit  Medication Sig Dispense Refill  . aspirin 81 MG tablet Take 81 mg by mouth daily.    Marland Kitchen atenolol (TENORMIN) 50 MG tablet Take 1 tablet (50 mg total) by mouth every morning. 100 tablet 3  . baclofen (LIORESAL) 10 MG tablet Take 10 mg by mouth 2 (two) times daily.    . cetirizine (ZYRTEC) 10 MG tablet Take 10 mg by mouth daily.    . fluticasone (FLONASE) 50 MCG/ACT nasal spray instill 2 sprays into each nostril once daily 48 g 3  . gabapentin (NEURONTIN) 100 MG capsule Take 1 capsule (100 mg total) by mouth 3 (three) times daily. 270 capsule 2  . hydrocortisone (ANUSOL-HC) 25 MG suppository Place 1 suppository (25 mg total) rectally 2 (two) times daily as needed for hemorrhoids or itching. 30 suppository 6  .  meclizine (ANTIVERT) 25 MG tablet TAKE 1/2 TO 1 TABLET EVERY 6 HOURS IF NEEDED 40 tablet 0  . meloxicam (MOBIC) 15 MG tablet TAKE 1 TABLET EVERY DAY 90 tablet 3  . Polyethyl Glycol-Propyl Glycol (SYSTANE) 0.4-0.3 % SOLN Apply 2 drops to eye 2 (two) times daily. 4 mL 3  . RABEprazole (ACIPHEX) 20 MG tablet TAKE 1 TABLET EVERY DAY 90 tablet 0  . simvastatin (ZOCOR) 40 MG  tablet TAKE 1 TABLET EVERY DAY 90 tablet 0  . traMADol (ULTRAM) 50 MG tablet take 1-2 tablets by mouth every 8 hours if needed 405 tablet 1  . triamterene-hydrochlorothiazide (MAXZIDE-25) 37.5-25 MG tablet TAKE 1 TABLET BY MOUTH EVERY MORNING 90 tablet 0  . VOLTAREN 1 % GEL Apply 2 g topically 4 (four) times daily. 3 Tube 6  . zonisamide (ZONEGRAN) 25 MG capsule Take 50 mg by mouth daily. Increase as directed  0  . [DISCONTINUED] ferrous sulfate 325 (65 FE) MG tablet Take 325 mg by mouth daily with breakfast.       Current Facility-Administered Medications on File Prior to Visit  Medication Dose Route Frequency Provider Last Rate Last Dose  . 0.9 %  sodium chloride infusion  500 mL Intravenous Once Irene Shipper, MD        BP 124/78 (BP Location: Left Arm, Patient Position: Sitting, Cuff Size: Normal)   Pulse 82   Temp 97.9 F (36.6 C) (Oral)   Ht 5' 4.5" (1.638 m) Comment: with shoes  Wt 162 lb 6.4 oz (73.7 kg)   SpO2 96%   BMI 27.45 kg/m       Objective:   Physical Exam  Constitutional: She is oriented to person, place, and time. She appears well-developed and well-nourished. No distress.  HENT:  Head: Normocephalic and atraumatic.  Right Ear: External ear normal.  Left Ear: External ear normal.  Nose: Nose normal.  Mouth/Throat: Oropharynx is clear and moist. No oropharyngeal exudate.  Eyes: Pupils are equal, round, and reactive to light. Conjunctivae and EOM are normal. Right eye exhibits no discharge. Left eye exhibits no discharge. No scleral icterus.  Neck: Normal range of motion. Neck supple. No JVD  present. No tracheal deviation present. No thyromegaly present.  Cardiovascular: Normal rate, regular rhythm, normal heart sounds and intact distal pulses. Exam reveals no gallop and no friction rub.  No murmur heard. Pulmonary/Chest: Effort normal and breath sounds normal. No respiratory distress. She has no wheezes. She has no rales. She exhibits no tenderness. Right breast exhibits no inverted nipple, no mass, no nipple discharge, no skin change and no tenderness. Left breast exhibits no inverted nipple, no mass, no nipple discharge, no skin change and no tenderness.  Abdominal: Soft. Bowel sounds are normal. She exhibits no distension and no mass. There is no tenderness. There is no rebound and no guarding. No hernia.  Musculoskeletal: Normal range of motion. She exhibits no edema, tenderness or deformity.  Lymphadenopathy:    She has no cervical adenopathy.  Neurological: She is alert and oriented to person, place, and time. She displays normal reflexes. No cranial nerve deficit or sensory deficit. She exhibits normal muscle tone. Coordination normal.  Skin: Skin is warm and dry. Capillary refill takes less than 2 seconds. No rash noted. She is not diaphoretic. No erythema. No pallor.  Psychiatric: She has a normal mood and affect. Her behavior is normal. Judgment and thought content normal.  Nursing note and vitals reviewed.     Assessment & Plan:  1. Routine general medical examination at a health care facility - Benign exam  - Follow up in one year or sooner if needed - Basic metabolic panel - CBC with Differential/Platelet - Hepatic function panel - Lipid panel - TSH  2. Hyperlipidemia, unspecified hyperlipidemia type - Consider increase in statin  - Basic metabolic panel - CBC with Differential/Platelet - Hepatic function panel - Lipid panel - TSH  3. Essential hypertension - Controlled.  No change in medications  - Basic metabolic panel - CBC with Differential/Platelet -  Hepatic function panel - Lipid panel - TSH  4. Gastroesophageal reflux disease without esophagitis - Controlled - Basic metabolic panel - CBC with Differential/Platelet - Hepatic function panel - Lipid panel - TSH  5. Immunity status testing  - Measles/Mumps/Rubella Immunity  Dorothyann Peng, NP

## 2017-12-18 NOTE — Progress Notes (Signed)
Patient ID: Kelsey Sparks, female   DOB: 10/20/1945, 72 y.o.   MRN: [id number] Complaint:  Visit Type: Patient returns to my office for continued preventative foot care services. Complaint: Patient states" my nails have grown long and thick and become painful to walk and wear shoes" . The patient presents for preventative foot care services. No changes to ROS.  Patient says she has pain in big toe joint right foot.  Podiatric Exam: Vascular: dorsalis pedis and posterior tibial pulses are palpable bilateral. Capillary return is immediate. Temperature gradient is WNL. Skin turgor WNL  Sensorium: Normal Semmes Weinstein monofilament test. Normal tactile sensation bilaterally. Nail Exam: Pt has thick disfigured discolored nails with subungual debris noted bilateral entire nail hallux through fifth toenails with the exception fourth toenail left foot. Ulcer Exam: There is no evidence of ulcer or pre-ulcerative changes or infection. Orthopedic Exam: Muscle tone and strength are WNL. No limitations in general ROM. No crepitus or effusions noted. Foot type and digits show no abnormalities. HAV  B/L. Skin: No Porokeratosis. No infection or ulcers  Diagnosis:  Onychomycosis, , Pain in right toe, pain in left toes  Treatment & Plan Procedures and Treatment: Consent by patient was obtained for treatment procedures. The patient understood the discussion of treatment and procedures well. All questions were answered thoroughly reviewed. Debridement of mycotic and hypertrophic toenails, 1 through 5 bilateral and clearing of subungual debris. No ulceration, no infection noted. Return Visit-Office Procedure: Patient instructed to return to the office for a follow up visit 3 months for continued evaluation and treatment.    Fallan Mccarey DPM 

## 2017-12-19 LAB — MEASLES/MUMPS/RUBELLA IMMUNITY
Mumps IgG: >300 AU/mL
RUBELLA: 31.8 {index}
Rubeola IgG: >300 AU/mL

## 2017-12-21 ENCOUNTER — Other Ambulatory Visit: Payer: Self-pay | Admitting: Adult Health

## 2017-12-31 ENCOUNTER — Other Ambulatory Visit: Payer: Self-pay | Admitting: Adult Health

## 2017-12-31 ENCOUNTER — Ambulatory Visit: Payer: Self-pay

## 2017-12-31 DIAGNOSIS — I10 Essential (primary) hypertension: Secondary | ICD-10-CM

## 2017-12-31 MED ORDER — SIMVASTATIN 40 MG PO TABS: 40.0000 mg | ORAL_TABLET | Freq: Every day | ORAL | 3 refills | Status: DC

## 2017-12-31 NOTE — Telephone Encounter (Signed)
Pt notified to stop the lipitor and restart simvastatin.  Rx sent to the pharmacy by e-scribe.  Atorvastatin added to allergy list.

## 2017-12-31 NOTE — Telephone Encounter (Signed)
Ok to stop lipitor. She was on Simvastain in the past, lets have her wait until hives resolve then can go back on Simvastatin 40 mg

## 2017-12-31 NOTE — Telephone Encounter (Signed)
Pt. Reports the same week she started the Lipitor, she started breaking out in "hives". On the back of her neck, arms,legs and feet. They are raised and "round-shaped" and itchy. She has continued the Lipitor and the rash has not resolved. Denies any other symptoms. Acknowledges that she had hives 2 weeks prior to starting Lipitor, and they went away. OTC treatments are not helping. Wants to know what Mr. Nafziger wants her to do about the Lipitor. Please advise pt.  Answer Assessment - Initial Assessment Questions 1. APPEARANCE: "What does the rash look like?"      Bumps - raised, not red 2. LOCATION: "Where is the rash located?"      Arms,legs, neck and feet 3. NUMBER: "How many hives are there?"      Maybe 20 4. SIZE: "How big are the hives?" (inches, cm, compare to coins) "Do they all look the same or is there lots of variation in shape and size?"      Size of an eraser 5. ONSET: "When did the hives begin?" (Hours or days ago)      I after I started the Lipitor 6. ITCHING: "Does it itch?" If so, ask: "How bad is the itch?"    - MILD: doesn't interfere with normal activities   - MODERATE - SEVERE: interferes with work, school, sleep, or other activities      Moderate 7. RECURRENT PROBLEM: "Have you had hives before?" If so, ask: "When was the last time?" and "What happened that time?"      Yes. I took Prednisone and they went away. 8. TRIGGERS: "Were you exposed to any new food, plant, cosmetic product or animal just before the hives began?"     Lipitor 9. OTHER SYMPTOMS: "Do you have any other symptoms?" (e.g., fever, tongue swelling, difficulty breathing, abdominal pain)     No 10. PREGNANCY: "Is there any chance you are pregnant?" "When was your last menstrual period?"       No  Protocols used: HIVES-A-AH

## 2018-01-01 ENCOUNTER — Telehealth: Payer: Self-pay

## 2018-01-01 DIAGNOSIS — Z23 Encounter for immunization: Secondary | ICD-10-CM

## 2018-01-01 NOTE — Telephone Encounter (Signed)
Copied from [place] [id number]. Topic: General - Other >> Jan 01, 2018  9:43 AM Yvette Rack wrote: Reason for CRM: pt calling wanting a RX for vaginal cream for dryness and hot flashes   RX conjugated estrogens (PREMARIN) vaginal cream that was given to her about a year ago [facility] [id number] - [place] [phone] (Phone) [phone] (Fax)

## 2018-01-02 NOTE — Telephone Encounter (Signed)
Ok to send in medication?   

## 2018-01-02 NOTE — Telephone Encounter (Signed)
Tommi Rumps are you okay with prescribing this medication, not on current medlist Last prescribed x 1 year ago.

## 2018-01-03 ENCOUNTER — Other Ambulatory Visit: Payer: Self-pay | Admitting: *Deleted

## 2018-01-03 ENCOUNTER — Telehealth: Payer: Self-pay | Admitting: *Deleted

## 2018-01-03 DIAGNOSIS — M19242 Secondary osteoarthritis, left hand: Secondary | ICD-10-CM

## 2018-01-03 DIAGNOSIS — M19241 Secondary osteoarthritis, right hand: Secondary | ICD-10-CM

## 2018-01-03 DIAGNOSIS — M1711 Unilateral primary osteoarthritis, right knee: Secondary | ICD-10-CM

## 2018-01-03 MED ORDER — DICLOFENAC SODIUM 1 % TD GEL: 2.0000 g | Freq: Four times a day (QID) | TRANSDERMAL | 0 refills | Status: DC

## 2018-01-03 MED ORDER — VOLTAREN 1 % TD GEL: 2.0000 g | Freq: Four times a day (QID) | TRANSDERMAL | 6 refills | Status: AC

## 2018-01-03 NOTE — Telephone Encounter (Signed)
[facility] will not have voltaren gel until about 2 weeks, so they suggested she get some from local pharmacy.  She needs it sent to [facility] on [address].  I have sent one fill over to the pharmacy and notified Kelsey Sparks.

## 2018-01-03 NOTE — Telephone Encounter (Signed)
LM for patient to clarify which strength she is wanting prescribed.  Pt has been prescribed 1mg , 0.5mg  and 0.25mg  in the past  Which one is she needing refilled?  Kelsey Sparks has okayed a refill we just need to know what strength.

## 2018-01-09 NOTE — Telephone Encounter (Signed)
Kelsey Sparks,  Pt asking for 1 gram.  Looked in chart.  The last filled was .25 to be placed vaginally twice daily.  Please advise.

## 2018-01-10 MED ORDER — ESTROGENS, CONJUGATED 0.625 MG/GM VA CREA: TOPICAL_CREAM | VAGINAL | 5 refills | Status: DC

## 2018-01-10 NOTE — Telephone Encounter (Signed)
Sent to the pharmacy by e-scribe. 

## 2018-01-10 NOTE — Telephone Encounter (Signed)
Start with 0.25 mg and work up if needed

## 2018-01-29 DIAGNOSIS — H524 Presbyopia: Secondary | ICD-10-CM | POA: Diagnosis not present

## 2018-01-29 DIAGNOSIS — H25043 Posterior subcapsular polar age-related cataract, bilateral: Secondary | ICD-10-CM | POA: Diagnosis not present

## 2018-01-29 DIAGNOSIS — H2513 Age-related nuclear cataract, bilateral: Secondary | ICD-10-CM | POA: Diagnosis not present

## 2018-01-29 DIAGNOSIS — H04121 Dry eye syndrome of right lacrimal gland: Secondary | ICD-10-CM | POA: Diagnosis not present

## 2018-01-29 DIAGNOSIS — H25013 Cortical age-related cataract, bilateral: Secondary | ICD-10-CM | POA: Diagnosis not present

## 2018-01-29 DIAGNOSIS — H52223 Regular astigmatism, bilateral: Secondary | ICD-10-CM | POA: Diagnosis not present

## 2018-01-29 DIAGNOSIS — H353132 Nonexudative age-related macular degeneration, bilateral, intermediate dry stage: Secondary | ICD-10-CM | POA: Diagnosis not present

## 2018-02-11 ENCOUNTER — Other Ambulatory Visit: Payer: Self-pay | Admitting: Adult Health

## 2018-02-11 DIAGNOSIS — K219 Gastro-esophageal reflux disease without esophagitis: Secondary | ICD-10-CM

## 2018-02-12 NOTE — Telephone Encounter (Signed)
Sent to the pharmacy by e-scribe. 

## 2018-02-26 ENCOUNTER — Other Ambulatory Visit: Payer: Self-pay | Admitting: Physical Medicine & Rehabilitation

## 2018-02-26 DIAGNOSIS — M19241 Secondary osteoarthritis, right hand: Secondary | ICD-10-CM

## 2018-02-26 DIAGNOSIS — M4716 Other spondylosis with myelopathy, lumbar region: Secondary | ICD-10-CM

## 2018-02-26 DIAGNOSIS — M19242 Secondary osteoarthritis, left hand: Principal | ICD-10-CM

## 2018-03-06 ENCOUNTER — Telehealth: Payer: Self-pay

## 2018-03-06 NOTE — Telephone Encounter (Signed)
Received a fax from [facility] asking that a PA be sent for simvastatin. PAwas sent to [employer] and response stated "no PA needed"

## 2018-03-12 ENCOUNTER — Encounter: Payer: Medicare HMO | Attending: Physical Medicine & Rehabilitation | Admitting: Physical Medicine & Rehabilitation

## 2018-03-12 ENCOUNTER — Encounter: Payer: Self-pay | Admitting: Physical Medicine & Rehabilitation

## 2018-03-12 VITALS — BP 102/68 | HR 71 | Ht 64.0 in | Wt 155.4 lb

## 2018-03-12 DIAGNOSIS — Z8719 Personal history of other diseases of the digestive system: Secondary | ICD-10-CM | POA: Diagnosis not present

## 2018-03-12 DIAGNOSIS — M19242 Secondary osteoarthritis, left hand: Secondary | ICD-10-CM

## 2018-03-12 DIAGNOSIS — R42 Dizziness and giddiness: Secondary | ICD-10-CM | POA: Diagnosis not present

## 2018-03-12 DIAGNOSIS — M4716 Other spondylosis with myelopathy, lumbar region: Secondary | ICD-10-CM

## 2018-03-12 DIAGNOSIS — Z9049 Acquired absence of other specified parts of digestive tract: Secondary | ICD-10-CM | POA: Diagnosis not present

## 2018-03-12 DIAGNOSIS — M19241 Secondary osteoarthritis, right hand: Secondary | ICD-10-CM

## 2018-03-12 DIAGNOSIS — Z803 Family history of malignant neoplasm of breast: Secondary | ICD-10-CM | POA: Insufficient documentation

## 2018-03-12 DIAGNOSIS — M5416 Radiculopathy, lumbar region: Secondary | ICD-10-CM | POA: Diagnosis not present

## 2018-03-12 DIAGNOSIS — Z87891 Personal history of nicotine dependence: Secondary | ICD-10-CM | POA: Diagnosis not present

## 2018-03-12 DIAGNOSIS — Z8249 Family history of ischemic heart disease and other diseases of the circulatory system: Secondary | ICD-10-CM | POA: Insufficient documentation

## 2018-03-12 DIAGNOSIS — H3530 Unspecified macular degeneration: Secondary | ICD-10-CM | POA: Diagnosis not present

## 2018-03-12 DIAGNOSIS — M25552 Pain in left hip: Secondary | ICD-10-CM | POA: Diagnosis not present

## 2018-03-12 DIAGNOSIS — Z8261 Family history of arthritis: Secondary | ICD-10-CM | POA: Insufficient documentation

## 2018-03-12 DIAGNOSIS — G8929 Other chronic pain: Secondary | ICD-10-CM | POA: Insufficient documentation

## 2018-03-12 DIAGNOSIS — M170 Bilateral primary osteoarthritis of knee: Secondary | ICD-10-CM | POA: Diagnosis not present

## 2018-03-12 DIAGNOSIS — M1712 Unilateral primary osteoarthritis, left knee: Secondary | ICD-10-CM | POA: Diagnosis not present

## 2018-03-12 DIAGNOSIS — Z90710 Acquired absence of both cervix and uterus: Secondary | ICD-10-CM | POA: Insufficient documentation

## 2018-03-12 DIAGNOSIS — S76012A Strain of muscle, fascia and tendon of left hip, initial encounter: Secondary | ICD-10-CM

## 2018-03-12 DIAGNOSIS — Z9889 Other specified postprocedural states: Secondary | ICD-10-CM | POA: Diagnosis not present

## 2018-03-12 NOTE — Progress Notes (Signed)
Subjective:    Patient ID: Kelsey Sparks, female    DOB: 1945/12/06, 72 y.o.   MRN: [id number]  HPI   Kelsey Sparks is here in follow up of her chronic pain. She has noticed more "catching" in the left hip and knee. She uses heat and ice which help somewhat.  She remains on tramadol for pain which seems to help as well as her gabapentin.  She stays as active as she can at home and likes to walk.  Right knee remains a problem as well but does not appear any worse than it has in the past.    Pain Inventory Average Pain 9 Pain Right Now 6 My pain is sharp and aching  In the last 24 hours, has pain interfered with the following? General activity 5 Relation with others 0 Enjoyment of life 0 What TIME of day is your pain at its worst? all Sleep (in general) Fair  Pain is worse with: walking, bending, standing and some activites Pain improves with: rest, heat/ice and medication Relief from Meds: 8  Mobility ability to climb steps?  yes do you drive?  yes  Function retired  Neuro/Psych numbness tingling spasms dizziness  Prior Studies Any changes since last visit?  no  Physicians involved in your care Any changes since last visit?  no   Family History  Problem Relation Age of Onset  . Arthritis Father   . Hypertension Father   . Arthritis Mother   . Asthma Mother   . Breast cancer Maternal Aunt   . Colon cancer Neg Hx   . Colon polyps Neg Hx   . Rectal cancer Neg Hx   . Stomach cancer Neg Hx    Social History   Socioeconomic History  . Marital status: Married    Spouse name: Not on file  . Number of children: Not on file  . Years of education: Not on file  . Highest education level: Not on file  Occupational History  . Not on file  Social Needs  . Financial resource strain: Not on file  . Food insecurity:    Worry: Not on file    Inability: Not on file  . Transportation needs:    Medical: Not on file    Non-medical: Not on file  Tobacco Use    . Smoking status: Former Smoker    Packs/day: 0.50    Years: 25.00    Pack years: 12.50    Last attempt to quit: 03/27/2008    Years since quitting: 9.9  . Smokeless tobacco: Never Used  Substance and Sexual Activity  . Alcohol use: No  . Drug use: No  . Sexual activity: Not on file  Lifestyle  . Physical activity:    Days per week: Not on file    Minutes per session: Not on file  . Stress: Not on file  Relationships  . Social connections:    Talks on phone: Not on file    Gets together: Not on file    Attends religious service: Not on file    Active member of club or organization: Not on file    Attends meetings of clubs or organizations: Not on file    Relationship status: Not on file  Other Topics Concern  . Not on file  Social History Narrative  . Not on file   Past Surgical History:  Procedure Laterality Date  . BACK SURGERY    . CHOLECYSTECTOMY    . COLONOSCOPY    .  KNEE SURGERY    . LUMBAR DISC SURGERY    . NECK SURGERY    . POLYPECTOMY    . right small toe surgery    . TOTAL ABDOMINAL HYSTERECTOMY W/ BILATERAL SALPINGOOPHORECTOMY    . UPPER GASTROINTESTINAL ENDOSCOPY     Past Medical History:  Diagnosis Date  . Adenomatous colon polyp   . Allergy   . Anemia   . Arthritis   . Constipation    pt uses metamucil  . Esophageal stricture   . GERD (gastroesophageal reflux disease)   . Hiatal hernia   . Hyperlipidemia   . Hypertension   . IBS (irritable bowel syndrome)   . Low back pain   . Macular degeneration   . Muscular degeneration   . Peripheral vascular disease (HCC)    BP 102/68   Pulse 71   Ht 5\' 4"  (1.626 m)   Wt 155 lb 6.4 oz (70.5 kg)   SpO2 97%   BMI 26.67 kg/m   Opioid Risk Score:   Fall Risk Score:  `1  Depression screen PHQ 2/9  Depression screen [facility] 2/9 12/18/2017 08/13/2017 12/13/2016 11/06/2016 03/15/2016 11/24/2015 02/24/2015  Decreased Interest 0 0 0 0 0 0 2  Down, Depressed, Hopeless 0 0 0 0 0 0 0  PHQ - 2 Score 0 0 0 0 0 0  2  Altered sleeping - - - - - - 1  Tired, decreased energy - - - - - - 2  Change in appetite - - - - - - 0  Feeling bad or failure about yourself  - - - - - - 0  Trouble concentrating - - - - - - 0  Moving slowly or fidgety/restless - - - - - - 1  Suicidal thoughts - - - - - - 0  PHQ-9 Score - - - - - - 6  Difficult doing work/chores - - - - - - Not difficult at all     Review of Systems  Constitutional: Positive for appetite change, diaphoresis and unexpected weight change.  HENT: Negative.   Eyes: Negative.   Respiratory: Negative.   Cardiovascular: Negative.   Gastrointestinal: Positive for abdominal pain and constipation.  Endocrine: Negative.   Genitourinary: Negative.   Musculoskeletal: Positive for arthralgias, joint swelling and myalgias.  Skin: Negative.   Allergic/Immunologic: Negative.   Neurological: Positive for dizziness and numbness.  Hematological: Negative.   Psychiatric/Behavioral: Negative.   All other systems reviewed and are negative.      Objective:   Physical Exam  General: No acute distress HEENT: EOMI, oral membranes moist Cards: reg rate  Chest: normal effort Abdomen: Soft, NT, ND Skin: dry, intact Extremities: no edema Musculoskeletal: Right knee with  ongoing effusion. gait is stable. She is less antalgic and has better weight shift. LB TTP.    Mild tenderness today with palpation of the left knee medial and lateral joint line.  Mild antalgia was seen right more than left.  No popping with range of motion at the hip in flexion or extension.  Mild tenderness with palpation in the inguinal region along the quad tendon insertion. Neurological:AxO x 3.motor 5/5. Cognition normal Skin: intact Psychiatric: Pleasant as always        Assessment & Plan:  1. 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 [place] was reviewed today.          -continueiceKnee and HEP   2. Right Lumbar Radiculitis/spondylosis: Continue Gabapentin which was refilled today.-continue with Pilates stretches and exercises today 3. Left hip flexor strain:   -stretches/exercises provided.    15 minutes of face to face patient care time were spent during this visit. All questions were encouraged and answered. Follow-up visit in 3 months time.   Greater than 50% of time during this encounter was spent counseling patient/family in regard to hip flexor anatomy and stretches. Marland Kitchen

## 2018-03-12 NOTE — Patient Instructions (Signed)
PLEASE FEEL FREE TO CALL OUR OFFICE WITH ANY PROBLEMS OR QUESTIONS ([phone])      

## 2018-03-15 ENCOUNTER — Encounter: Payer: Self-pay | Admitting: Podiatry

## 2018-03-15 ENCOUNTER — Ambulatory Visit (INDEPENDENT_AMBULATORY_CARE_PROVIDER_SITE_OTHER): Payer: Medicare HMO | Admitting: Podiatry

## 2018-03-15 DIAGNOSIS — B351 Tinea unguium: Secondary | ICD-10-CM | POA: Diagnosis not present

## 2018-03-15 DIAGNOSIS — M79676 Pain in unspecified toe(s): Secondary | ICD-10-CM

## 2018-03-15 NOTE — Progress Notes (Signed)
Patient ID: BRYER GOTTSCH, female   DOB: 1945-06-10, 72 y.o.   MRN: [id number] Complaint:  Visit Type: Patient returns to my office for continued preventative foot care services. Complaint: Patient states" my nails have grown long and thick and become painful to walk and wear shoes" . The patient presents for preventative foot care services. No changes to ROS.  Patient says she has pain in big toe joint right foot.  Podiatric Exam: Vascular: dorsalis pedis and posterior tibial pulses are palpable bilateral. Capillary return is immediate. Temperature gradient is WNL. Skin turgor WNL  Sensorium: Normal Semmes Weinstein monofilament test. Normal tactile sensation bilaterally. Nail Exam: Pt has thick disfigured discolored nails with subungual debris noted bilateral entire nail hallux through fifth toenails with the exception fourth toenail left foot. Ulcer Exam: There is no evidence of ulcer or pre-ulcerative changes or infection. Orthopedic Exam: Muscle tone and strength are WNL. No limitations in general ROM. No crepitus or effusions noted. Foot type and digits show no abnormalities. HAV  B/L. Skin: No Porokeratosis. No infection or ulcers  Diagnosis:  Onychomycosis, , Pain in right toe, pain in left toes  Treatment & Plan Procedures and Treatment: Consent by patient was obtained for treatment procedures. The patient understood the discussion of treatment and procedures well. All questions were answered thoroughly reviewed. Debridement of mycotic and hypertrophic toenails, 1 through 5 bilateral and clearing of subungual debris. No ulceration, no infection noted. Return Visit-Office Procedure: Patient instructed to return to the office for a follow up visit 3 months for continued evaluation and treatment.    Gardiner Barefoot DPM

## 2018-03-31 ENCOUNTER — Other Ambulatory Visit: Payer: Self-pay | Admitting: Adult Health

## 2018-03-31 DIAGNOSIS — I10 Essential (primary) hypertension: Secondary | ICD-10-CM

## 2018-04-02 NOTE — Telephone Encounter (Signed)
Sent to the pharmacy by e-scribe. 

## 2018-04-18 NOTE — Addendum Note (Signed)
Addended by: Geryl Rankins D on: 04/18/2018 12:11 PM   Modules accepted: Orders

## 2018-04-19 MED ORDER — TRAMADOL HCL 50 MG PO TABS: ORAL_TABLET | ORAL | 5 refills | Status: DC

## 2018-04-19 NOTE — Addendum Note (Signed)
Addended by: Geryl Rankins D on: 04/19/2018 01:01 PM   Modules accepted: Orders

## 2018-04-22 ENCOUNTER — Ambulatory Visit: Payer: Medicare HMO | Admitting: Podiatry

## 2018-04-22 ENCOUNTER — Encounter: Payer: Self-pay | Admitting: Podiatry

## 2018-04-22 DIAGNOSIS — B351 Tinea unguium: Secondary | ICD-10-CM

## 2018-04-22 DIAGNOSIS — M779 Enthesopathy, unspecified: Secondary | ICD-10-CM | POA: Diagnosis not present

## 2018-04-22 MED ORDER — TRIAMCINOLONE ACETONIDE 10 MG/ML IJ SUSP
10.0000 mg | Freq: Once | INTRAMUSCULAR | Status: AC
  Administered 2018-04-22: 10 mg

## 2018-04-23 NOTE — Progress Notes (Signed)
Subjective:   Patient ID: Kelsey Sparks, female   DOB: 73 y.o.   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

## 2018-04-24 ENCOUNTER — Other Ambulatory Visit: Payer: Self-pay | Admitting: Physical Medicine & Rehabilitation

## 2018-04-26 ENCOUNTER — Telehealth: Payer: Self-pay | Admitting: Family Medicine

## 2018-04-26 DIAGNOSIS — Z23 Encounter for immunization: Secondary | ICD-10-CM

## 2018-04-26 MED ORDER — ESTROGENS, CONJUGATED 0.625 MG/GM VA CREA: TOPICAL_CREAM | VAGINAL | 0 refills | Status: AC

## 2018-04-26 NOTE — Telephone Encounter (Signed)
Sent to the pharmacy by e-scribe. 

## 2018-04-26 NOTE — Telephone Encounter (Signed)
Twice weekly

## 2018-04-26 NOTE — Telephone Encounter (Signed)
Received a fax from [facility] stating the directions for Premarin Cream are wrong.  Directions should say, 1/2 gram twice weekly.  Currentl directions say 1/2 gram twice daily.  Will check with Tommi Rumps to see which direction is correct.

## 2018-05-16 DIAGNOSIS — M791 Myalgia, unspecified site: Secondary | ICD-10-CM | POA: Diagnosis not present

## 2018-05-16 DIAGNOSIS — G43019 Migraine without aura, intractable, without status migrainosus: Secondary | ICD-10-CM | POA: Diagnosis not present

## 2018-05-16 DIAGNOSIS — G518 Other disorders of facial nerve: Secondary | ICD-10-CM | POA: Diagnosis not present

## 2018-05-16 DIAGNOSIS — M542 Cervicalgia: Secondary | ICD-10-CM | POA: Diagnosis not present

## 2018-06-05 ENCOUNTER — Other Ambulatory Visit: Payer: Self-pay | Admitting: Family Medicine

## 2018-06-05 DIAGNOSIS — K219 Gastro-esophageal reflux disease without esophagitis: Secondary | ICD-10-CM

## 2018-06-05 DIAGNOSIS — I10 Essential (primary) hypertension: Secondary | ICD-10-CM

## 2018-06-05 DIAGNOSIS — H6690 Otitis media, unspecified, unspecified ear: Secondary | ICD-10-CM

## 2018-06-05 MED ORDER — SIMVASTATIN 40 MG PO TABS: 40.0000 mg | ORAL_TABLET | Freq: Every day | ORAL | 1 refills | Status: DC

## 2018-06-05 MED ORDER — TRIAMTERENE-HCTZ 37.5-25 MG PO TABS: 1.0000 | ORAL_TABLET | Freq: Every morning | ORAL | 1 refills | Status: DC

## 2018-06-05 MED ORDER — ATENOLOL 50 MG PO TABS: ORAL_TABLET | ORAL | 1 refills | Status: DC

## 2018-06-05 MED ORDER — FLUTICASONE PROPIONATE 50 MCG/ACT NA SUSP: NASAL | 1 refills | Status: AC

## 2018-06-05 MED ORDER — RABEPRAZOLE SODIUM 20 MG PO TBEC: 20.0000 mg | DELAYED_RELEASE_TABLET | Freq: Every day | ORAL | 1 refills | Status: DC

## 2018-06-05 NOTE — Telephone Encounter (Signed)
Sent to the pharmacy by e-scribe. 

## 2018-06-11 ENCOUNTER — Other Ambulatory Visit: Payer: Self-pay

## 2018-06-11 ENCOUNTER — Encounter: Payer: Medicare Other | Attending: Physical Medicine & Rehabilitation | Admitting: Registered Nurse

## 2018-06-11 ENCOUNTER — Encounter: Payer: Self-pay | Admitting: Registered Nurse

## 2018-06-11 VITALS — BP 118/76 | HR 69 | Ht 64.0 in | Wt 155.8 lb

## 2018-06-11 DIAGNOSIS — M19242 Secondary osteoarthritis, left hand: Secondary | ICD-10-CM

## 2018-06-11 DIAGNOSIS — Z87891 Personal history of nicotine dependence: Secondary | ICD-10-CM | POA: Insufficient documentation

## 2018-06-11 DIAGNOSIS — M5416 Radiculopathy, lumbar region: Secondary | ICD-10-CM | POA: Diagnosis not present

## 2018-06-11 DIAGNOSIS — M25552 Pain in left hip: Secondary | ICD-10-CM | POA: Diagnosis not present

## 2018-06-11 DIAGNOSIS — Z9889 Other specified postprocedural states: Secondary | ICD-10-CM | POA: Insufficient documentation

## 2018-06-11 DIAGNOSIS — R42 Dizziness and giddiness: Secondary | ICD-10-CM | POA: Insufficient documentation

## 2018-06-11 DIAGNOSIS — Z8249 Family history of ischemic heart disease and other diseases of the circulatory system: Secondary | ICD-10-CM | POA: Diagnosis not present

## 2018-06-11 DIAGNOSIS — Z90710 Acquired absence of both cervix and uterus: Secondary | ICD-10-CM | POA: Insufficient documentation

## 2018-06-11 DIAGNOSIS — M19241 Secondary osteoarthritis, right hand: Secondary | ICD-10-CM | POA: Diagnosis not present

## 2018-06-11 DIAGNOSIS — Z9049 Acquired absence of other specified parts of digestive tract: Secondary | ICD-10-CM | POA: Insufficient documentation

## 2018-06-11 DIAGNOSIS — Z79891 Long term (current) use of opiate analgesic: Secondary | ICD-10-CM | POA: Diagnosis not present

## 2018-06-11 DIAGNOSIS — M170 Bilateral primary osteoarthritis of knee: Secondary | ICD-10-CM | POA: Diagnosis not present

## 2018-06-11 DIAGNOSIS — H3530 Unspecified macular degeneration: Secondary | ICD-10-CM | POA: Insufficient documentation

## 2018-06-11 DIAGNOSIS — M1712 Unilateral primary osteoarthritis, left knee: Secondary | ICD-10-CM

## 2018-06-11 DIAGNOSIS — Z8261 Family history of arthritis: Secondary | ICD-10-CM | POA: Insufficient documentation

## 2018-06-11 DIAGNOSIS — G8929 Other chronic pain: Secondary | ICD-10-CM | POA: Diagnosis not present

## 2018-06-11 DIAGNOSIS — Z8719 Personal history of other diseases of the digestive system: Secondary | ICD-10-CM | POA: Insufficient documentation

## 2018-06-11 DIAGNOSIS — G894 Chronic pain syndrome: Secondary | ICD-10-CM | POA: Diagnosis not present

## 2018-06-11 DIAGNOSIS — M4716 Other spondylosis with myelopathy, lumbar region: Secondary | ICD-10-CM

## 2018-06-11 DIAGNOSIS — M1711 Unilateral primary osteoarthritis, right knee: Secondary | ICD-10-CM

## 2018-06-11 DIAGNOSIS — Z5181 Encounter for therapeutic drug level monitoring: Secondary | ICD-10-CM | POA: Diagnosis not present

## 2018-06-11 DIAGNOSIS — Z803 Family history of malignant neoplasm of breast: Secondary | ICD-10-CM | POA: Diagnosis not present

## 2018-06-11 MED ORDER — MELOXICAM 15 MG PO TABS: 15.0000 mg | ORAL_TABLET | Freq: Every day | ORAL | 2 refills | Status: DC

## 2018-06-11 NOTE — Progress Notes (Signed)
Subjective:    Patient ID: Kelsey Sparks, female    DOB: [birth year], 73 y.o.   MRN: [id number]  HPI: Kelsey Sparks is a 73 y.o. female who returns for follow up appointment for chronic pain and medication refill. She states her pain is located in her lower back, and bilateral knee pain, also reports joint pain in her hands. She rates her pain 4. Her current exercise regime is walking and performing stretching exercises. She was going to the [employer] three days a week prior to the closing.    Ms. Karabin Morphine equivalent is 30.68  MME. Last UDS was Performed on 12/11/2017, it was consistent. Oral Swab Performed today.    Pain Inventory Average Pain 4 Pain Right Now 4 My pain is sharp, stabbing and aching  In the last 24 hours, has pain interfered with the following? General activity 4 Relation with others 0 Enjoyment of life 2 What TIME of day is your pain at its worst? morning evening night Sleep (in general) Fair  Pain is worse with: bending, inactivity, standing and some activites Pain improves with: rest, heat/ice and injections Relief from Meds: 8  Mobility how many minutes can you walk? 5 ability to climb steps?  yes do you drive?  yes  Function retired  Neuro/Psych spasms dizziness  Prior Studies Any changes since last visit?  no  Physicians involved in your care Any changes since last visit?  no   Family History  Problem Relation Age of Onset  . Arthritis Father   . Hypertension Father   . Arthritis Mother   . Asthma Mother   . Breast cancer Maternal Aunt   . Colon cancer Neg Hx   . Colon polyps Neg Hx   . Rectal cancer Neg Hx   . Stomach cancer Neg Hx    Social History   Socioeconomic History  . Marital status: Married    Spouse name: Not on file  . Number of children: Not on file  . Years of education: Not on file  . Highest education level: Not on file  Occupational History  . Not on file  Social Needs  . Financial  resource strain: Not on file  . Food insecurity:    Worry: Not on file    Inability: Not on file  . Transportation needs:    Medical: Not on file    Non-medical: Not on file  Tobacco Use  . Smoking status: Former Smoker    Packs/day: 0.50    Years: 25.00    Pack years: 12.50    Last attempt to quit: 03/27/2008    Years since quitting: 10.2  . Smokeless tobacco: Never Used  Substance and Sexual Activity  . Alcohol use: No  . Drug use: No  . Sexual activity: Not on file  Lifestyle  . Physical activity:    Days per week: Not on file    Minutes per session: Not on file  . Stress: Not on file  Relationships  . Social connections:    Talks on phone: Not on file    Gets together: Not on file    Attends religious service: Not on file    Active member of club or organization: Not on file    Attends meetings of clubs or organizations: Not on file    Relationship status: Not on file  Other Topics Concern  . Not on file  Social History Narrative  . Not on file   Past  Surgical History:  Procedure Laterality Date  . BACK SURGERY    . CHOLECYSTECTOMY    . COLONOSCOPY    . KNEE SURGERY    . LUMBAR DISC SURGERY    . NECK SURGERY    . POLYPECTOMY    . right small toe surgery    . TOTAL ABDOMINAL HYSTERECTOMY W/ BILATERAL SALPINGOOPHORECTOMY    . UPPER GASTROINTESTINAL ENDOSCOPY     Past Medical History:  Diagnosis Date  . Adenomatous colon polyp   . Allergy   . Anemia   . Arthritis   . Constipation    pt uses metamucil  . Esophageal stricture   . GERD (gastroesophageal reflux disease)   . Hiatal hernia   . Hyperlipidemia   . Hypertension   . IBS (irritable bowel syndrome)   . Low back pain   . Macular degeneration   . Muscular degeneration   . Peripheral vascular disease (HCC)    BP 118/76   Pulse 69   Ht 5\' 4"  (1.626 m)   Wt 155 lb 12.8 oz (70.7 kg)   SpO2 95%   BMI 26.74 kg/m   Opioid Risk Score:   Fall Risk Score:  `1  Depression screen PHQ 2/9   Depression screen [facility] 2/9 12/18/2017 08/13/2017 12/13/2016 11/06/2016 03/15/2016 11/24/2015 02/24/2015  Decreased Interest 0 0 0 0 0 0 2  Down, Depressed, Hopeless 0 0 0 0 0 0 0  PHQ - 2 Score 0 0 0 0 0 0 2  Altered sleeping - - - - - - 1  Tired, decreased energy - - - - - - 2  Change in appetite - - - - - - 0  Feeling bad or failure about yourself  - - - - - - 0  Trouble concentrating - - - - - - 0  Moving slowly or fidgety/restless - - - - - - 1  Suicidal thoughts - - - - - - 0  PHQ-9 Score - - - - - - 6  Difficult doing work/chores - - - - - - Not difficult at all   Review of Systems  Constitutional: Positive for appetite change, diaphoresis and unexpected weight change.  HENT: Negative.   Eyes: Negative.   Respiratory: Negative.   Cardiovascular: Negative.   Gastrointestinal: Positive for nausea.  Endocrine: Negative.   Genitourinary: Negative.   Musculoskeletal: Negative.   Skin: Negative.   Allergic/Immunologic: Negative.   Neurological: Negative.   Hematological: Negative.   Psychiatric/Behavioral: Negative.   All other systems reviewed and are negative.      Objective:   Physical Exam Vitals signs and nursing note reviewed.  Constitutional:      Appearance: Normal appearance.  Neck:     Musculoskeletal: Normal range of motion and neck supple.  Cardiovascular:     Rate and Rhythm: Normal rate and regular rhythm.     Pulses: Normal pulses.     Heart sounds: Normal heart sounds.  Pulmonary:     Effort: Pulmonary effort is normal.     Breath sounds: Normal breath sounds.  Musculoskeletal:     Right lower leg: Edema present.     Left lower leg: Edema present.     Comments: Normal Muscle Bulk and Muscle Testing Reveals: Upper Extremities: Full ROM and Muscle Strength 5/5  Back without spinal Tenderness Noted  Lower Extremities: Full ROM and Muscle Strength 5/5 Arises from Table with ease Narrow Based Gait   Skin:    General: Skin is warm  and dry.  Neurological:      Mental Status: She is alert and oriented to person, place, and time.  Psychiatric:        Mood and Affect: Mood normal.        Behavior: Behavior normal.           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 06/07/2018. 2. Right Lumbar Radiculitis:Continue to Monitor. Continue Gabapentin. 06/07/2018. 3. Pain Management: Continue Tramadol and HEP. 06/07/2018. 4. Bilateral Hands with OA: Continue current medication regimen. Continue to Monitor.   15 minutes of face to face patient care time was spent during this visit. All questions were encouraged and answered.  F/U in 1 month

## 2018-06-12 ENCOUNTER — Ambulatory Visit (INDEPENDENT_AMBULATORY_CARE_PROVIDER_SITE_OTHER): Payer: Medicare Other | Admitting: Podiatry

## 2018-06-12 ENCOUNTER — Encounter: Payer: Self-pay | Admitting: Podiatry

## 2018-06-12 DIAGNOSIS — M129 Arthropathy, unspecified: Secondary | ICD-10-CM | POA: Diagnosis not present

## 2018-06-12 DIAGNOSIS — M79676 Pain in unspecified toe(s): Secondary | ICD-10-CM

## 2018-06-12 DIAGNOSIS — B351 Tinea unguium: Secondary | ICD-10-CM | POA: Diagnosis not present

## 2018-06-12 NOTE — Progress Notes (Signed)
Patient ID: AHMONI EDGE, female   DOB: [birth year], 73 y.o.   MRN: [id number] Complaint:  Visit Type: Patient returns to my office for continued preventative foot care services. Complaint: Patient states" my nails have grown long and thick and become painful to walk and wear shoes" . The patient presents for preventative foot care services. No changes to ROS.  Patient says she has pain in big toe joint right foot.  Podiatric Exam: Vascular: dorsalis pedis and posterior tibial pulses are palpable bilateral. Capillary return is immediate. Temperature gradient is WNL. Skin turgor WNL  Sensorium: Normal Semmes Weinstein monofilament test. Normal tactile sensation bilaterally. Nail Exam: Pt has thick disfigured discolored nails with subungual debris noted bilateral entire nail hallux through fifth toenails with the exception fourth toenail left foot. Ulcer Exam: There is no evidence of ulcer or pre-ulcerative changes or infection. Orthopedic Exam: Muscle tone and strength are WNL. No limitations in general ROM. No crepitus or effusions noted. Foot type and digits show no abnormalities. HAV  B/L. Skin: No Porokeratosis. No infection or ulcers  Diagnosis:  Onychomycosis, , Pain in right toe, pain in left toes  Treatment & Plan Procedures and Treatment: Consent by patient was obtained for treatment procedures. The patient understood the discussion of treatment and procedures well. All questions were answered thoroughly reviewed. Debridement of mycotic and hypertrophic toenails, 1 through 5 bilateral and clearing of subungual debris. No ulceration, no infection noted. Return Visit-Office Procedure: Patient instructed to return to the office for a follow up visit 3 months for continued evaluation and treatment.    Gardiner Barefoot DPM

## 2018-06-13 ENCOUNTER — Ambulatory Visit: Payer: Medicare Other | Admitting: Adult Health

## 2018-06-13 ENCOUNTER — Other Ambulatory Visit: Payer: Self-pay

## 2018-06-15 LAB — DRUG TOX MONITOR 1 W/CONF, ORAL FLD
Amphetamines: NEGATIVE ng/mL (ref <10)
Barbiturates: NEGATIVE ng/mL (ref <10)
Benzodiazepines: NEGATIVE ng/mL (ref <0.50)
Buprenorphine: NEGATIVE ng/mL (ref <0.10)
Cocaine: NEGATIVE ng/mL (ref <5.0)
Fentanyl: NEGATIVE ng/mL (ref <0.10)
Heroin Metabolite: NEGATIVE ng/mL (ref <1.0)
MARIJUANA: NEGATIVE ng/mL (ref <2.5)
MDMA: NEGATIVE ng/mL (ref <10)
Meprobamate: NEGATIVE ng/mL (ref <2.5)
Methadone: NEGATIVE ng/mL (ref <5.0)
Nicotine Metabolite: NEGATIVE ng/mL (ref <5.0)
Opiates: NEGATIVE ng/mL (ref <2.5)
PHENCYCLIDINE: NEGATIVE ng/mL (ref <10)
Tapentadol: NEGATIVE ng/mL (ref <5.0)
Tramadol: 415.5 ng/mL — ABNORMAL HIGH (ref <5.0)
Tramadol: POSITIVE ng/mL — AB (ref <5.0)
ZOLPIDEM: NEGATIVE ng/mL (ref <5.0)

## 2018-06-15 LAB — DRUG TOX ALC METAB W/CON, ORAL FLD: Alcohol Metabolite: NEGATIVE ng/mL (ref <25)

## 2018-06-17 ENCOUNTER — Telehealth: Payer: Self-pay | Admitting: *Deleted

## 2018-06-17 NOTE — Telephone Encounter (Signed)
Oral swab drug screen was consistent for prescribed medications.  ?

## 2018-07-03 ENCOUNTER — Telehealth: Payer: Self-pay | Admitting: *Deleted

## 2018-07-08 NOTE — Telephone Encounter (Signed)
Pt states she is doing well and does not need a virtual visit at this time

## 2018-07-23 ENCOUNTER — Other Ambulatory Visit: Payer: Self-pay | Admitting: *Deleted

## 2018-07-23 DIAGNOSIS — M19242 Secondary osteoarthritis, left hand: Principal | ICD-10-CM

## 2018-07-23 DIAGNOSIS — M4716 Other spondylosis with myelopathy, lumbar region: Secondary | ICD-10-CM

## 2018-07-23 DIAGNOSIS — M19241 Secondary osteoarthritis, right hand: Secondary | ICD-10-CM

## 2018-07-23 MED ORDER — TRAMADOL HCL 50 MG PO TABS: ORAL_TABLET | ORAL | 5 refills | Status: DC

## 2018-07-24 ENCOUNTER — Ambulatory Visit (INDEPENDENT_AMBULATORY_CARE_PROVIDER_SITE_OTHER): Payer: Medicare Other | Admitting: Adult Health

## 2018-07-24 ENCOUNTER — Encounter: Payer: Self-pay | Admitting: Adult Health

## 2018-07-24 ENCOUNTER — Ambulatory Visit: Payer: Self-pay | Admitting: Adult Health

## 2018-07-24 ENCOUNTER — Other Ambulatory Visit: Payer: Self-pay

## 2018-07-24 DIAGNOSIS — H8113 Benign paroxysmal vertigo, bilateral: Secondary | ICD-10-CM

## 2018-07-24 NOTE — Telephone Encounter (Signed)
Pr called in c/o being dizzy that started last night.   She has a history of vertigo.   She took her "pill for vertigo this morning but it hasn't helped".   "It usually helps but this time it has not".  I warm transferred the pt to [facility]' Nafziger's office to [place] to schedule for a virtual visit due to the COVID-19 pandemic in progress unless they decide she needs to go to the ED.  I sent these notes to the office.   Reason for Disposition . SEVERE dizziness (e.g., unable to stand, requires support to walk, feels like passing out now)  Answer Assessment - Initial Assessment Questions 1. DESCRIPTION: "Describe your dizziness."     I'm feeling nauseaed.   No vomiting.   I have vertigo.   I took a pill for it but I'm still dizzy.    It took the pill around 5:00 AM.  It has not helped. 2. LIGHTHEADED: "Do you feel lightheaded?" (e.g., somewhat faint, woozy, weak upon standing)     Lightheaded.   When I walk I have to hold onto the wall or something. 3. VERTIGO: "Do you feel like either you or the room is spinning or tilting?" (i.e. vertigo)     No.   I'm sitting down now.   My walk is not steady.   4. SEVERITY: "How bad is it?"  "Do you feel like you are going to faint?" "Can you stand and walk?"   - MILD - walking normally   - MODERATE - interferes with normal activities (e.g., work, school)    - SEVERE - unable to stand, requires support to walk, feels like passing out now.      I have to hold onto something when I walk. 5. ONSET:  "When did the dizziness begin?"     It started last night sometime after 8:00 PM.  I had trouble walking last night too. 6. AGGRAVATING FACTORS: "Does anything make it worse?" (e.g., standing, change in head position)     No 7. HEART RATE: "Can you tell me your heart rate?" "How many beats in 15 seconds?"  (Note: not all patients can do this)       Not asked 8. CAUSE: "What do you think is causing the dizziness?"     I don't know.   I know I have vertigo.     It's the same feelings as when I have vertigo.    9. RECURRENT SYMPTOM: "Have you had dizziness before?" If so, ask: "When was the last time?" "What happened that time?"     It's been over a year since my last vertigo attack.   Usually when I take the medicine it clears up but this time it didn't. 10. OTHER SYMPTOMS: "Do you have any other symptoms?" (e.g., fever, chest pain, vomiting, diarrhea, bleeding)       Last night I had blurred vision but not this morning.   I had a little headache last night but not this morning.   No other symptoms.   No sickness at all.    My niece is here.    11. PREGNANCY: "Is there any chance you are pregnant?" "When was your last menstrual period?"       N/A  Protocols used: DIZZINESS [facility]

## 2018-07-24 NOTE — Progress Notes (Signed)
Virtual Visit via Video Note  I connected with Elzada Pytel on 07/24/18 at  2:30 PM EDT by a video enabled telemedicine application and verified that I am speaking with the correct person using two identifiers.  Location patient: home Location provider:work or home office Persons participating in the virtual visit: patient, provider  I discussed the limitations of evaluation and management by telemedicine and the availability of in person appointments. The patient expressed understanding and agreed to proceed.   HPI: 73 year old female who is being evaluated today for dizziness.  He reports last night 8 PM she started feeling as though the world was spinning.  This lasted until this morning at which time she took a dose of meclizine at 5 AM and another dose at 11 AM.  The dizziness lasted till approximately 8 AM this morning and then stopped.  She felt as though this was a typical episode of vertigo like she has had in the past.  Dizziness was worse when getting out of bed and with change in positions.    This afternoon she reports that the dizziness has since resolved but she is feeling weak and fatigued.  Has not had any falls, nausea, or vomiting.   ROS: See pertinent positives and negatives per HPI.  Past Medical History:  Diagnosis Date  . Adenomatous colon polyp   . Allergy   . Anemia   . Arthritis   . Constipation    pt uses metamucil  . Esophageal stricture   . GERD (gastroesophageal reflux disease)   . Hiatal hernia   . Hyperlipidemia   . Hypertension   . IBS (irritable bowel syndrome)   . Low back pain   . Macular degeneration   . Muscular degeneration   . Peripheral vascular disease [facility])     Past Surgical History:  Procedure Laterality Date  . BACK SURGERY    . CHOLECYSTECTOMY    . COLONOSCOPY    . KNEE SURGERY    . LUMBAR DISC SURGERY    . NECK SURGERY    . POLYPECTOMY    . right small toe surgery    . TOTAL ABDOMINAL HYSTERECTOMY W/ BILATERAL  SALPINGOOPHORECTOMY    . UPPER GASTROINTESTINAL ENDOSCOPY      Family History  Problem Relation Age of Onset  . Arthritis Father   . Hypertension Father   . Arthritis Mother   . Asthma Mother   . Breast cancer Maternal Aunt   . Colon cancer Neg Hx   . Colon polyps Neg Hx   . Rectal cancer Neg Hx   . Stomach cancer Neg Hx      Current Outpatient Medications:  .  aspirin 81 MG tablet, Take 81 mg by mouth daily., Disp: , Rfl:  .  atenolol (TENORMIN) 50 MG tablet, TAKE 1 TABLET(50 MG) BY MOUTH EVERY MORNING, Disp: 90 tablet, Rfl: 1 .  baclofen (LIORESAL) 10 MG tablet, Take 10 mg by mouth 2 (two) times daily., Disp: , Rfl:  .  cetirizine (ZYRTEC) 10 MG tablet, Take 10 mg by mouth daily., Disp: , Rfl:  .  conjugated estrogens (PREMARIN) vaginal cream, 0.5 applicatorful vaginally twice weekly., Disp: 30 g, Rfl: 0 .  fluticasone (FLONASE) 50 MCG/ACT nasal spray, instill 2 sprays into each nostril once daily, Disp: 48 g, Rfl: 1 .  gabapentin (NEURONTIN) 100 MG capsule, Take 1 capsule (100 mg total) by mouth 3 (three) times daily., Disp: 270 capsule, Rfl: 2 .  hydrocortisone (ANUSOL-HC) 25 MG suppository, Place 1  suppository (25 mg total) rectally 2 (two) times daily as needed for hemorrhoids or itching., Disp: 30 suppository, Rfl: 6 .  meclizine (ANTIVERT) 25 MG tablet, TAKE 1/2 TO 1 TABLET EVERY 6 HOURS IF NEEDED, Disp: 40 tablet, Rfl: 0 .  meloxicam (MOBIC) 15 MG tablet, Take 1 tablet (15 mg total) by mouth daily., Disp: 90 tablet, Rfl: 2 .  Polyethyl Glycol-Propyl Glycol (SYSTANE) 0.4-0.3 % SOLN, Apply 2 drops to eye 2 (two) times daily., Disp: 4 mL, Rfl: 3 .  RABEprazole (ACIPHEX) 20 MG tablet, Take 1 tablet (20 mg total) by mouth daily., Disp: 90 tablet, Rfl: 1 .  simvastatin (ZOCOR) 40 MG tablet, Take 1 tablet (40 mg total) by mouth daily., Disp: 90 tablet, Rfl: 1 .  traMADol (ULTRAM) 50 MG tablet, Take 1-2 tabletsby mouth every 8 hours for pain, Disp: 135 tablet, Rfl: 5 .   triamterene-hydrochlorothiazide (MAXZIDE-25) 37.5-25 MG tablet, Take 1 tablet by mouth every morning., Disp: 90 tablet, Rfl: 1 .  VOLTAREN 1 % GEL, Apply 2 g topically 4 (four) times daily., Disp: 3 Tube, Rfl: 6 .  zonisamide (ZONEGRAN) 25 MG capsule, Take 50 mg by mouth daily. Increase as directed; for Migraines, Disp: , Rfl: 0  EXAM:  VITALS per patient if applicable:  GENERAL: alert, oriented, appears well and in no acute distress  HEENT: atraumatic, conjunttiva clear, no obvious abnormalities on inspection of external nose and ears  NECK: normal movements of the head and neck  LUNGS: on inspection no signs of respiratory distress, breathing rate appears normal, no obvious gross SOB, gasping or wheezing  CV: no obvious cyanosis  MS: moves all visible extremities without noticeable abnormality  PSYCH/NEURO: pleasant and cooperative, no obvious depression or anxiety, speech and thought processing grossly intact  ASSESSMENT AND PLAN:  Discussed the following assessment and plan: Advised that the feelings of fatigue and mild sedation are likely result of meclizine.  She should feel better when she wakes up in the morning.  Advised to stay hydrated and rest.  Return precautions reviewed  Benign paroxysmal positional vertigo due to bilateral vestibular disorder     I discussed the assessment and treatment plan with the patient. The patient was provided an opportunity to ask questions and all were answered. The patient agreed with the plan and demonstrated an understanding of the instructions.   The patient was advised to call back or seek an in-person evaluation if the symptoms worsen or if the condition fails to improve as anticipated.   Dorothyann Peng, NP

## 2018-07-27 ENCOUNTER — Emergency Department (HOSPITAL_COMMUNITY): Payer: Medicare Other

## 2018-07-27 ENCOUNTER — Other Ambulatory Visit: Payer: Self-pay

## 2018-07-27 ENCOUNTER — Encounter (HOSPITAL_COMMUNITY): Payer: Self-pay | Admitting: Emergency Medicine

## 2018-07-27 ENCOUNTER — Inpatient Hospital Stay (HOSPITAL_COMMUNITY)
Admission: EM | Admit: 2018-07-27 | Discharge: 2018-07-29 | DRG: 041 | Disposition: A | Discharge status: Home Health | Payer: Medicare Other | Attending: Internal Medicine | Admitting: Internal Medicine

## 2018-07-27 DIAGNOSIS — N183 Chronic kidney disease, stage 3 (moderate): Secondary | ICD-10-CM | POA: Diagnosis present

## 2018-07-27 DIAGNOSIS — K219 Gastro-esophageal reflux disease without esophagitis: Secondary | ICD-10-CM | POA: Diagnosis present

## 2018-07-27 DIAGNOSIS — Z791 Long term (current) use of non-steroidal anti-inflammatories (NSAID): Secondary | ICD-10-CM | POA: Diagnosis not present

## 2018-07-27 DIAGNOSIS — I6389 Other cerebral infarction: Secondary | ICD-10-CM | POA: Diagnosis not present

## 2018-07-27 DIAGNOSIS — Z79899 Other long term (current) drug therapy: Secondary | ICD-10-CM

## 2018-07-27 DIAGNOSIS — Z888 Allergy status to other drugs, medicaments and biological substances status: Secondary | ICD-10-CM

## 2018-07-27 DIAGNOSIS — G43909 Migraine, unspecified, not intractable, without status migrainosus: Secondary | ICD-10-CM | POA: Diagnosis present

## 2018-07-27 DIAGNOSIS — I10 Essential (primary) hypertension: Secondary | ICD-10-CM | POA: Diagnosis present

## 2018-07-27 DIAGNOSIS — R471 Dysarthria and anarthria: Secondary | ICD-10-CM | POA: Diagnosis present

## 2018-07-27 DIAGNOSIS — M8580 Other specified disorders of bone density and structure, unspecified site: Secondary | ICD-10-CM | POA: Diagnosis present

## 2018-07-27 DIAGNOSIS — G8194 Hemiplegia, unspecified affecting left nondominant side: Secondary | ICD-10-CM | POA: Diagnosis present

## 2018-07-27 DIAGNOSIS — Z79890 Hormone replacement therapy (postmenopausal): Secondary | ICD-10-CM

## 2018-07-27 DIAGNOSIS — I63442 Cerebral infarction due to embolism of left cerebellar artery: Secondary | ICD-10-CM | POA: Diagnosis present

## 2018-07-27 DIAGNOSIS — M19019 Primary osteoarthritis, unspecified shoulder: Secondary | ICD-10-CM | POA: Diagnosis present

## 2018-07-27 DIAGNOSIS — E041 Nontoxic single thyroid nodule: Secondary | ICD-10-CM | POA: Diagnosis present

## 2018-07-27 DIAGNOSIS — G500 Trigeminal neuralgia: Secondary | ICD-10-CM | POA: Diagnosis present

## 2018-07-27 DIAGNOSIS — Z87891 Personal history of nicotine dependence: Secondary | ICD-10-CM

## 2018-07-27 DIAGNOSIS — I129 Hypertensive chronic kidney disease with stage 1 through stage 4 chronic kidney disease, or unspecified chronic kidney disease: Secondary | ICD-10-CM | POA: Diagnosis present

## 2018-07-27 DIAGNOSIS — Z8261 Family history of arthritis: Secondary | ICD-10-CM

## 2018-07-27 DIAGNOSIS — Z803 Family history of malignant neoplasm of breast: Secondary | ICD-10-CM

## 2018-07-27 DIAGNOSIS — G629 Polyneuropathy, unspecified: Secondary | ICD-10-CM | POA: Diagnosis present

## 2018-07-27 DIAGNOSIS — M19042 Primary osteoarthritis, left hand: Secondary | ICD-10-CM | POA: Diagnosis present

## 2018-07-27 DIAGNOSIS — I639 Cerebral infarction, unspecified: Secondary | ICD-10-CM | POA: Diagnosis present

## 2018-07-27 DIAGNOSIS — R29702 NIHSS score 2: Secondary | ICD-10-CM | POA: Diagnosis present

## 2018-07-27 DIAGNOSIS — Z7982 Long term (current) use of aspirin: Secondary | ICD-10-CM

## 2018-07-27 DIAGNOSIS — Z90710 Acquired absence of both cervix and uterus: Secondary | ICD-10-CM

## 2018-07-27 DIAGNOSIS — Z1159 Encounter for screening for other viral diseases: Secondary | ICD-10-CM

## 2018-07-27 DIAGNOSIS — R260 Ataxic gait: Secondary | ICD-10-CM | POA: Diagnosis present

## 2018-07-27 DIAGNOSIS — I671 Cerebral aneurysm, nonruptured: Secondary | ICD-10-CM | POA: Diagnosis present

## 2018-07-27 DIAGNOSIS — H3530 Unspecified macular degeneration: Secondary | ICD-10-CM | POA: Diagnosis present

## 2018-07-27 DIAGNOSIS — M19041 Primary osteoarthritis, right hand: Secondary | ICD-10-CM | POA: Diagnosis present

## 2018-07-27 DIAGNOSIS — Z7951 Long term (current) use of inhaled steroids: Secondary | ICD-10-CM | POA: Diagnosis not present

## 2018-07-27 DIAGNOSIS — Z8249 Family history of ischemic heart disease and other diseases of the circulatory system: Secondary | ICD-10-CM

## 2018-07-27 DIAGNOSIS — Z9079 Acquired absence of other genital organ(s): Secondary | ICD-10-CM

## 2018-07-27 DIAGNOSIS — E785 Hyperlipidemia, unspecified: Secondary | ICD-10-CM | POA: Diagnosis present

## 2018-07-27 DIAGNOSIS — Z9049 Acquired absence of other specified parts of digestive tract: Secondary | ICD-10-CM

## 2018-07-27 LAB — CBC
HCT: 35.1 % — ABNORMAL LOW (ref 36.0–46.0)
HCT: 33.6 % — ABNORMAL LOW (ref 36.0–46.0)
Hemoglobin: 11.4 g/dL — ABNORMAL LOW (ref 12.0–15.0)
Hemoglobin: 11.3 g/dL — ABNORMAL LOW (ref 12.0–15.0)
MCH: 30.2 pg (ref 26.0–34.0)
MCH: 31.1 pg (ref 26.0–34.0)
MCHC: 32.5 g/dL (ref 30.0–36.0)
MCHC: 33.6 g/dL (ref 30.0–36.0)
MCV: 93.1 fL (ref 80.0–100.0)
MCV: 92.6 fL (ref 80.0–100.0)
Platelets: 176 10*3/uL (ref 150–400)
Platelets: 171 10*3/uL (ref 150–400)
RBC: 3.77 MIL/uL — ABNORMAL LOW (ref 3.87–5.11)
RBC: 3.63 MIL/uL — ABNORMAL LOW (ref 3.87–5.11)
RDW: 13.4 % (ref 11.5–15.5)
RDW: 13.4 % (ref 11.5–15.5)
WBC: 7.1 10*3/uL (ref 4.0–10.5)
WBC: 7.5 10*3/uL (ref 4.0–10.5)
nRBC: 0 % (ref 0.0–0.2)
nRBC: 0 % (ref 0.0–0.2)

## 2018-07-27 LAB — APTT: aPTT: 30 seconds (ref 24–36)

## 2018-07-27 LAB — URINALYSIS, ROUTINE W REFLEX MICROSCOPIC
Bilirubin Urine: NEGATIVE
Glucose, UA: NEGATIVE mg/dL
Hgb urine dipstick: NEGATIVE
Ketones, ur: NEGATIVE mg/dL
Leukocytes,Ua: NEGATIVE
Nitrite: NEGATIVE
Protein, ur: NEGATIVE mg/dL
Specific Gravity, Urine: 1.01 (ref 1.005–1.030)
pH: 7 (ref 5.0–8.0)

## 2018-07-27 LAB — COMPREHENSIVE METABOLIC PANEL
ALT: 12 U/L (ref 0–44)
AST: 15 U/L (ref 15–41)
Albumin: 4.1 g/dL (ref 3.5–5.0)
Alkaline Phosphatase: 105 U/L (ref 38–126)
Anion gap: 8 (ref 5–15)
BUN: 19 mg/dL (ref 8–23)
CO2: 25 mmol/L (ref 22–32)
Calcium: 9.3 mg/dL (ref 8.9–10.3)
Chloride: 107 mmol/L (ref 98–111)
Creatinine, Ser: 1.38 mg/dL — ABNORMAL HIGH (ref 0.44–1.00)
GFR calc Af Amer: 45 mL/min — ABNORMAL LOW (ref >60)
GFR calc non Af Amer: 39 mL/min — ABNORMAL LOW (ref >60)
Glucose, Bld: 94 mg/dL (ref 70–99)
Potassium: 3.8 mmol/L (ref 3.5–5.1)
Sodium: 140 mmol/L (ref 135–145)
Total Bilirubin: 0.5 mg/dL (ref 0.3–1.2)
Total Protein: 7.5 g/dL (ref 6.5–8.1)

## 2018-07-27 LAB — TSH: TSH: 1.289 u[IU]/mL (ref 0.350–4.500)

## 2018-07-27 LAB — DIFFERENTIAL
Abs Immature Granulocytes: 0.02 10*3/uL (ref 0.00–0.07)
Basophils Absolute: 0.1 10*3/uL (ref 0.0–0.1)
Basophils Relative: 1 %
Eosinophils Absolute: 0.3 10*3/uL (ref 0.0–0.5)
Eosinophils Relative: 5 %
Immature Granulocytes: 0 %
Lymphocytes Relative: 22 %
Lymphs Abs: 1.6 10*3/uL (ref 0.7–4.0)
Monocytes Absolute: 0.6 10*3/uL (ref 0.1–1.0)
Monocytes Relative: 8 %
Neutro Abs: 4.5 10*3/uL (ref 1.7–7.7)
Neutrophils Relative %: 64 %

## 2018-07-27 LAB — CREATININE, SERUM
Creatinine, Ser: 1.27 mg/dL — ABNORMAL HIGH (ref 0.44–1.00)
GFR calc Af Amer: 50 mL/min — ABNORMAL LOW (ref >60)
GFR calc non Af Amer: 43 mL/min — ABNORMAL LOW (ref >60)

## 2018-07-27 LAB — PROTIME-INR
INR: 0.9 (ref 0.8–1.2)
Prothrombin Time: 12.4 seconds (ref 11.4–15.2)

## 2018-07-27 LAB — SARS CORONAVIRUS 2 BY RT PCR (HOSPITAL ORDER, PERFORMED IN ~~LOC~~ HOSPITAL LAB): SARS Coronavirus 2: NEGATIVE

## 2018-07-27 MED ORDER — MUSCLE RUB 10-15 % EX CREA: 1.0000 "application " | TOPICAL_CREAM | Freq: Four times a day (QID) | CUTANEOUS | Status: DC | PRN

## 2018-07-27 MED ORDER — GABAPENTIN 100 MG PO CAPS
100.0000 mg | ORAL_CAPSULE | Freq: Three times a day (TID) | ORAL | Status: DC
  Administered 2018-07-27 – 2018-07-29 (×5): 100 mg via ORAL
  Filled 2018-07-27 (×5): qty 1

## 2018-07-27 MED ORDER — ASPIRIN 325 MG PO TABS
325.0000 mg | ORAL_TABLET | Freq: Every day | ORAL | Status: DC
  Administered 2018-07-27 – 2018-07-28 (×2): 325 mg via ORAL
  Filled 2018-07-27 (×2): qty 1

## 2018-07-27 MED ORDER — SENNOSIDES-DOCUSATE SODIUM 8.6-50 MG PO TABS: 2.0000 | ORAL_TABLET | Freq: Every evening | ORAL | Status: DC | PRN

## 2018-07-27 MED ORDER — ENOXAPARIN SODIUM 40 MG/0.4ML ~~LOC~~ SOLN
40.0000 mg | SUBCUTANEOUS | Status: DC
  Administered 2018-07-27 – 2018-07-28 (×2): 40 mg via SUBCUTANEOUS
  Filled 2018-07-27 (×2): qty 0.4

## 2018-07-27 MED ORDER — HYDROCORTISONE (PERIANAL) 2.5 % EX CREA: 1.0000 "application " | TOPICAL_CREAM | Freq: Four times a day (QID) | CUTANEOUS | Status: DC | PRN

## 2018-07-27 MED ORDER — ACETAMINOPHEN 325 MG PO TABS: 650.0000 mg | ORAL_TABLET | ORAL | Status: DC | PRN

## 2018-07-27 MED ORDER — ASPIRIN 300 MG RE SUPP: 300.0000 mg | Freq: Every day | RECTAL | Status: DC

## 2018-07-27 MED ORDER — SODIUM CHLORIDE 0.9 % IV SOLN
INTRAVENOUS | Status: AC
  Administered 2018-07-27: 22:00:00 via INTRAVENOUS

## 2018-07-27 MED ORDER — ACETAMINOPHEN 160 MG/5ML PO SOLN: 650.0000 mg | ORAL | Status: DC | PRN

## 2018-07-27 MED ORDER — ACETAMINOPHEN 650 MG RE SUPP: 650.0000 mg | RECTAL | Status: DC | PRN

## 2018-07-27 MED ORDER — POLYETHYLENE GLYCOL 3350 17 G PO PACK: 17.0000 g | PACK | Freq: Every day | ORAL | Status: DC | PRN

## 2018-07-27 MED ORDER — PHENOL 1.4 % MT LIQD: 1.0000 | OROMUCOSAL | Status: DC | PRN

## 2018-07-27 MED ORDER — STROKE: EARLY STAGES OF RECOVERY BOOK
Freq: Once | Status: AC
  Administered 2018-07-27: 16:00:00
  Filled 2018-07-27: qty 1

## 2018-07-27 MED ORDER — HYDROCORTISONE 1 % EX CREA: 1.0000 "application " | TOPICAL_CREAM | Freq: Three times a day (TID) | CUTANEOUS | Status: DC | PRN

## 2018-07-27 MED ORDER — POLYVINYL ALCOHOL 1.4 % OP SOLN: 1.0000 [drp] | OPHTHALMIC | Status: DC | PRN

## 2018-07-27 MED ORDER — PANTOPRAZOLE SODIUM 40 MG PO TBEC
40.0000 mg | DELAYED_RELEASE_TABLET | Freq: Every day | ORAL | Status: DC
  Administered 2018-07-28 – 2018-07-29 (×2): 40 mg via ORAL
  Filled 2018-07-27 (×2): qty 1

## 2018-07-27 MED ORDER — LORATADINE 10 MG PO TABS: 10.0000 mg | ORAL_TABLET | Freq: Every day | ORAL | Status: DC | PRN

## 2018-07-27 MED ORDER — ALUM & MAG HYDROXIDE-SIMETH 200-200-20 MG/5ML PO SUSP: 30.0000 mL | ORAL | Status: DC | PRN

## 2018-07-27 MED ORDER — SALINE SPRAY 0.65 % NA SOLN: 1.0000 | NASAL | Status: DC | PRN

## 2018-07-27 MED ORDER — LIP MEDEX EX OINT
1.0000 "application " | TOPICAL_OINTMENT | CUTANEOUS | Status: DC | PRN
  Filled 2018-07-27: qty 7

## 2018-07-27 NOTE — ED Notes (Signed)
MRI called @ 13:21.

## 2018-07-27 NOTE — ED Notes (Signed)
Patient requesting food. MD made aware patient passed stroke swallow screen. Awaiting diet orders.

## 2018-07-27 NOTE — ED Notes (Signed)
[facility] called for transport. 

## 2018-07-27 NOTE — ED Triage Notes (Signed)
Pt c/o weakness in L hand x 4 days. Pt states she feels heaviness in L hand and has to assist L hand at times. No other deficits reported or observed.

## 2018-07-27 NOTE — ED Notes (Signed)
ED Provider at bedside. 

## 2018-07-27 NOTE — H&P (Signed)
History and Physical    AARION [facility] RFF:[id number] DOB: 1945-07-10 DOA: 07/27/2018  PCP: Kelsey Peng, NP Patient coming from: Home  Chief Complaint: Left arm weakness  HPI: Kelsey [facility] is a 73 y.o. female with medical history significant of with past medical history of essential hypertension, hyperlipidemia, GERD, allergic rhinitis, trigeminal neuralgia came to the [facility] for evaluation of left-sided arm weakness.  Patient states about 4 days ago she started experiencing left upper extremity weakness.  She did not seek medical attention despite of her niece insisting upon evaluation.  Finally today she decided to get evaluated.  She denies having previous stroke or similar symptoms in the past.  Reports of medication compliance.  Denies any tobacco, alcohol and illicit drug use In the ER CT of the head and MRI of the brain confirmed acute CVA.  Overall her routine labs were unremarkable.  Case was discussed with the neurologist by the ER provider and recommended transferring patient to [facility].  When I saw the patient she was still having left upper extremity weakness and had not changed much over the last 3 days.   Review of Systems: As per HPI otherwise 10 point review of systems negative.  Review of Systems Otherwise negative except as per HPI, including: General: Denies fever, chills, night sweats or unintended weight loss. Resp: Denies cough, wheezing, shortness of breath. Cardiac: Denies chest pain, palpitations, orthopnea, paroxysmal nocturnal dyspnea. GI: Denies abdominal pain, nausea, vomiting, diarrhea or constipation GU: Denies dysuria, frequency, hesitancy or incontinence MS: Left arm weakness Neuro: Denies headache, neurologic deficits (focal weakness, numbness, tingling), abnormal gait Psych: Denies anxiety, depression, SI/HI/AVH Skin: Denies new rashes or lesions ID: Denies sick contacts, exotic exposures, travel  Past Medical History:    Diagnosis Date   Adenomatous colon polyp    Allergy    Anemia    Arthritis    Constipation    pt uses metamucil   Esophageal stricture    GERD (gastroesophageal reflux disease)    Hiatal hernia    Hyperlipidemia    Hypertension    IBS (irritable bowel syndrome)    Low back pain    Macular degeneration    Muscular degeneration    Peripheral vascular disease ([place])     Past Surgical History:  Procedure Laterality Date   BACK SURGERY     CHOLECYSTECTOMY     COLONOSCOPY     KNEE SURGERY     LUMBAR [place]     POLYPECTOMY     right small toe surgery     TOTAL ABDOMINAL HYSTERECTOMY W/ BILATERAL SALPINGOOPHORECTOMY     UPPER GASTROINTESTINAL ENDOSCOPY      SOCIAL HISTORY:  reports that she quit smoking about 10 years ago. She has a 12.50 pack-year smoking history. She has never used smokeless tobacco. She reports that she does not drink alcohol or use drugs.  Allergies  Allergen Reactions   Atorvastatin Hives    FAMILY HISTORY: Family History  Problem Relation Age of Onset   Arthritis Father    Hypertension Father    Arthritis Mother    Asthma Mother    Breast cancer Maternal Aunt    Colon cancer Neg Hx    Colon polyps Neg Hx    Rectal cancer Neg Hx    Stomach cancer Neg Hx      Prior to Admission medications   Medication Sig Start Date End Date Taking? Authorizing Provider  aspirin 81 MG  tablet Take 81 mg by mouth daily.    [provider]  atenolol (TENORMIN) 50 MG tablet TAKE 1 TABLET(50 MG) BY MOUTH EVERY MORNING 06/05/18   Nafziger, Tommi Rumps, NP  baclofen (LIORESAL) 10 MG tablet Take 10 mg by mouth 2 (two) times daily.    Orie Rout, MD  cetirizine (ZYRTEC) 10 MG tablet Take 10 mg by mouth daily.    [provider]  conjugated estrogens (PREMARIN) vaginal cream 0.5 applicatorful vaginally twice weekly. 04/26/18   Nafziger, Tommi Rumps, NP  fluticasone Asencion Islam) 50 MCG/ACT nasal spray  instill 2 sprays into each nostril once daily 06/05/18   Nafziger, Tommi Rumps, NP  gabapentin (NEURONTIN) 100 MG capsule Take 1 capsule (100 mg total) by mouth 3 (three) times daily. 12/11/17   Meredith Staggers, MD  hydrocortisone (ANUSOL-HC) 25 MG suppository Place 1 suppository (25 mg total) rectally 2 (two) times daily as needed for hemorrhoids or itching. 09/26/16   Nafziger, Tommi Rumps, NP  meclizine (ANTIVERT) 25 MG tablet TAKE 1/2 TO 1 TABLET EVERY 6 HOURS IF NEEDED 03/22/16   Nafziger, Tommi Rumps, NP  meloxicam (MOBIC) 15 MG tablet Take 1 tablet (15 mg total) by mouth daily. 06/11/18   Bayard Hugger, NP  Polyethyl Glycol-Propyl Glycol (SYSTANE) 0.4-0.3 % SOLN Apply 2 drops to eye 2 (two) times daily. 04/27/17   Nafziger, Tommi Rumps, NP  RABEprazole (ACIPHEX) 20 MG tablet Take 1 tablet (20 mg total) by mouth daily. 06/05/18   Nafziger, Tommi Rumps, NP  simvastatin (ZOCOR) 40 MG tablet Take 1 tablet (40 mg total) by mouth daily. 06/05/18   Nafziger, Tommi Rumps, NP  traMADol Veatrice Bourbon) 50 MG tablet Take 1-2 tabletsby mouth every 8 hours for pain 07/23/18   Meredith Staggers, MD  triamterene-hydrochlorothiazide (MAXZIDE-25) 37.5-25 MG tablet Take 1 tablet by mouth every morning. 06/05/18   Nafziger, Tommi Rumps, NP  VOLTAREN 1 % GEL Apply 2 g topically 4 (four) times daily. 01/03/18   Meredith Staggers, MD  zonisamide (ZONEGRAN) 25 MG capsule Take 50 mg by mouth daily. Increase as directed; for Migraines 10/21/14   Orie Rout, MD  ferrous sulfate 325 (65 FE) MG tablet Take 325 mg by mouth daily with breakfast.    06/21/11  [provider]    Physical Exam: Vitals:   07/27/18 1211 07/27/18 1300 07/27/18 1515  BP: 121/81 122/82 134/82  Pulse: 87 75 75  Resp: 18 17 19   Temp: 98.7 F (37.1 C)    TempSrc: Oral    SpO2: 98% 98% 100%      Constitutional: NAD, calm, comfortable Eyes: PERRL, lids and conjunctivae normal ENMT: Mucous membranes are moist. Posterior pharynx clear of any exudate or lesions.Normal dentition.  Neck:  normal, supple, no masses, no thyromegaly Respiratory: clear to auscultation bilaterally, no wheezing, no crackles. Normal respiratory effort. No accessory muscle use.  Cardiovascular: Regular rate and rhythm, no murmurs / rubs / gallops. No extremity edema. 2+ pedal pulses. No carotid bruits.  Abdomen: no tenderness, no masses palpated. No hepatosplenomegaly. Bowel sounds positive.  Musculoskeletal: no clubbing / cyanosis. No joint deformity upper and lower extremities. Good ROM, no contractures. Normal muscle tone.  Skin: no rashes, lesions, ulcers. No induration Neurologic: CN 2-12 grossly intact. Sensation intact, DTR normal.  Left arm strength 3/5, rest of the extremities 4+/5 Psychiatric: Normal judgment and insight. Alert and oriented x 3. Normal mood.     Labs on Admission: I have personally reviewed following labs and imaging studies  CBC: Recent Labs  Lab 07/27/18 1343  WBC 7.1  NEUTROABS 4.5  HGB 11.4*  HCT 35.1*  MCV 93.1  PLT 962   Basic Metabolic Panel: Recent Labs  Lab 07/27/18 1343  NA 140  K 3.8  CL 107  CO2 25  GLUCOSE 94  BUN 19  CREATININE 1.38*  CALCIUM 9.3   GFR: CrCl cannot be calculated (Unknown ideal weight.). Liver Function Tests: Recent Labs  Lab 07/27/18 1343  AST 15  ALT 12  ALKPHOS 105  BILITOT 0.5  PROT 7.5  ALBUMIN 4.1   No results for input(s): LIPASE, AMYLASE in the last 168 hours. No results for input(s): AMMONIA in the last 168 hours. Coagulation Profile: Recent Labs  Lab 07/27/18 1343  INR 0.9   Cardiac Enzymes: No results for input(s): CKTOTAL, CKMB, CKMBINDEX, TROPONINI in the last 168 hours. BNP (last 3 results) No results for input(s): PROBNP in the last 8760 hours. HbA1C: No results for input(s): HGBA1C in the last 72 hours. CBG: No results for input(s): GLUCAP in the last 168 hours. Lipid Profile: No results for input(s): CHOL, HDL, LDLCALC, TRIG, CHOLHDL, LDLDIRECT in the last 72 hours. Thyroid Function  Tests: No results for input(s): TSH, T4TOTAL, FREET4, T3FREE, THYROIDAB in the last 72 hours. Anemia Panel: No results for input(s): VITAMINB12, FOLATE, FERRITIN, TIBC, IRON, RETICCTPCT in the last 72 hours. Urine analysis:    Component Value Date/Time   COLORURINE YELLOW 07/27/2018 1309   APPEARANCEUR CLEAR 07/27/2018 1309   LABSPEC 1.010 07/27/2018 1309   PHURINE 7.0 07/27/2018 1309   GLUCOSEU NEGATIVE 07/27/2018 1309   HGBUR NEGATIVE 07/27/2018 1309   HGBUR negative 09/15/2009 0901   BILIRUBINUR NEGATIVE 07/27/2018 1309   BILIRUBINUR n 12/07/2015 1609   KETONESUR NEGATIVE 07/27/2018 1309   PROTEINUR NEGATIVE 07/27/2018 1309   UROBILINOGEN 1.0 12/07/2015 1609   UROBILINOGEN 1.0 09/15/2009 0901   NITRITE NEGATIVE 07/27/2018 1309   LEUKOCYTESUR NEGATIVE 07/27/2018 1309   Sepsis Labs: !!!!!!!!!!!!!!!!!!!!!!!!!!!!!!!!!!!!!!!!!!!! @LABRCNTIP (procalcitonin:4,lacticidven:4) )No results found for this or any previous visit (from the past 240 hour(s)).   Radiological Exams on Admission: Ct Head Wo Contrast  Result Date: 07/27/2018 CLINICAL DATA:  Focal neurologic deficit greater than 6 hours. Left upper extremity numbness and heaviness. EXAM: CT HEAD WITHOUT CONTRAST CT CERVICAL SPINE WITHOUT CONTRAST TECHNIQUE: Multidetector CT imaging of the head and cervical spine was performed following the standard protocol without intravenous contrast. Multiplanar CT image reconstructions of the cervical spine were also generated. COMPARISON:  10/27/2010 head CT. FINDINGS: CT HEAD FINDINGS Brain: Rounded focus of cortical hypodensity in the anterior left cerebellar hemisphere with associated mild local sulcal effacement, new since 2012 head CT. Small focus of encephalomalacia in posteromedial right cerebellar hemisphere, favor remote infarct. No evidence of parenchymal hemorrhage or extra-axial fluid collection. No mass lesion, mass effect, or midline shift. Nonspecific mild subcortical and  periventricular white matter hypodensity, most in keeping with chronic small vessel ischemic change. Cerebral volume is age appropriate. No ventriculomegaly. Vascular: No acute abnormality. Skull: No evidence of calvarial fracture. Sinuses/Orbits: The visualized paranasal sinuses are essentially clear. Other:  The mastoid air cells are unopacified. CT CERVICAL SPINE FINDINGS Alignment: Straightening of the cervical spine. No facet subluxation. Dens is well positioned between the lateral masses of C1. Minimal 2 mm retrolisthesis C5-6. Minimal 2 mm anterolisthesis C7-T1. Skull base and vertebrae: No acute fracture. No primary bone lesion or focal pathologic process. Soft tissues and spinal canal: No prevertebral edema. No visible canal hematoma. Disc levels: Status post ACDF at C6-7, with no evidence of hardware  fracture or loosening. Bony fusion of C6-7 disc space. Moderate to marked multilevel degenerative disc disease in the mid to lower cervical spine, most prominent C5-6. Mild-to-moderate bilateral facet arthropathy. Moderate degenerative foraminal stenosis on the left at C5-6 and C6-7 predominantly due to uncovertebral hypertrophy. Mild-to-moderate degenerative foraminal stenosis on the right at C5-6 due to uncovertebral hypertrophy. Upper chest: No acute abnormality. Other: Visualized mastoid air cells appear clear. Bilateral hypodense thyroid nodules, largest 1.9 cm on the left. No pathologically enlarged cervical nodes. IMPRESSION: 1. Rounded focus of cortical hypodensity in the anterior left cerebellar hemisphere with associated mild local sulcal effacement, new since 2012 head CT, most compatible with acute/early subacute left cerebellar infarct. 2. Small focus of encephalomalacia in the posteromedial right cerebellar hemisphere, favor remote infarct. 3. No acute intracranial hemorrhage. 4. Mild chronic small vessel ischemic changes in the cerebral white matter. 5. Moderate-to-marked multilevel degenerative  disc disease in the mid to lower cervical spine. ACDF C6-7 with no evidence of hardware complication. 6. Mild-to-moderate multilevel degenerative foraminal stenosis in the lower cervical spine as detailed. 7. Dominant 1.9 cm left thyroid lobe nodule. Thyroid ultrasound correlation warranted on a short term outpatient basis if not previously performed. This follows ACR consensus guidelines: Managing Incidental Thyroid Nodules Detected on Imaging: White Paper of the ACR Incidental Thyroid Findings Committee. J Am Coll Radiol 2015; 12:143-150. Critical Value/emergent results were called by telephone at the time of interpretation on 07/27/2018 at 2:20 pm to Dr. Varney Biles , who verbally acknowledged these results. Electronically Signed   By: Ilona Sorrel M.D.   On: 07/27/2018 14:29   Ct Cervical Spine Wo Contrast  Result Date: 07/27/2018 CLINICAL DATA:  Focal neurologic deficit greater than 6 hours. Left upper extremity numbness and heaviness. EXAM: CT HEAD WITHOUT CONTRAST CT CERVICAL SPINE WITHOUT CONTRAST TECHNIQUE: Multidetector CT imaging of the head and cervical spine was performed following the standard protocol without intravenous contrast. Multiplanar CT image reconstructions of the cervical spine were also generated. COMPARISON:  10/27/2010 head CT. FINDINGS: CT HEAD FINDINGS Brain: Rounded focus of cortical hypodensity in the anterior left cerebellar hemisphere with associated mild local sulcal effacement, new since 2012 head CT. Small focus of encephalomalacia in posteromedial right cerebellar hemisphere, favor remote infarct. No evidence of parenchymal hemorrhage or extra-axial fluid collection. No mass lesion, mass effect, or midline shift. Nonspecific mild subcortical and periventricular white matter hypodensity, most in keeping with chronic small vessel ischemic change. Cerebral volume is age appropriate. No ventriculomegaly. Vascular: No acute abnormality. Skull: No evidence of calvarial fracture.  Sinuses/Orbits: The visualized paranasal sinuses are essentially clear. Other:  The mastoid air cells are unopacified. CT CERVICAL SPINE FINDINGS Alignment: Straightening of the cervical spine. No facet subluxation. Dens is well positioned between the lateral masses of C1. Minimal 2 mm retrolisthesis C5-6. Minimal 2 mm anterolisthesis C7-T1. Skull base and vertebrae: No acute fracture. No primary bone lesion or focal pathologic process. Soft tissues and spinal canal: No prevertebral edema. No visible canal hematoma. Disc levels: Status post ACDF at C6-7, with no evidence of hardware fracture or loosening. Bony fusion of C6-7 disc space. Moderate to marked multilevel degenerative disc disease in the mid to lower cervical spine, most prominent C5-6. Mild-to-moderate bilateral facet arthropathy. Moderate degenerative foraminal stenosis on the left at C5-6 and C6-7 predominantly due to uncovertebral hypertrophy. Mild-to-moderate degenerative foraminal stenosis on the right at C5-6 due to uncovertebral hypertrophy. Upper chest: No acute abnormality. Other: Visualized mastoid air cells appear clear. Bilateral hypodense thyroid nodules,  largest 1.9 cm on the left. No pathologically enlarged cervical nodes. IMPRESSION: 1. Rounded focus of cortical hypodensity in the anterior left cerebellar hemisphere with associated mild local sulcal effacement, new since 2012 head CT, most compatible with acute/early subacute left cerebellar infarct. 2. Small focus of encephalomalacia in the posteromedial right cerebellar hemisphere, favor remote infarct. 3. No acute intracranial hemorrhage. 4. Mild chronic small vessel ischemic changes in the cerebral white matter. 5. 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 ACDF C6-7 with no evidence of hardware complication. 6. Mild-to-moderate multilevel degenerative foraminal stenosis in the lower cervical spine as detailed. 7. Dominant 1.9 cm left  thyroid lobe nodule. Thyroid ultrasound correlation warranted on a short term outpatient basis if not previously performed. This follows ACR consensus guidelines: Managing Incidental Thyroid Nodules Detected on Imaging: White Paper of the ACR Incidental Thyroid Findings Committee. J Am Coll Radiol 2015; 12:143-150. Critical Value/emergent results were called by telephone at the time of interpretation on 07/27/2018 at 2:20 pm to Dr. Varney Biles , who verbally acknowledged these results. Electronically Signed   By: Ilona Sorrel M.D.   On: 07/27/2018 14:29   Mr Brain Wo Contrast  Result Date: 07/27/2018 CLINICAL DATA:  Left hand weakness for 4 days EXAM: MRI HEAD WITHOUT CONTRAST TECHNIQUE: Multiplanar, multiecho pulse sequences of the brain and surrounding structures were obtained without intravenous contrast. COMPARISON:  Head CT from earlier today FINDINGS: Brain: Patchy restricted diffusion in the left mid to upper cerebellum involving a moderate to large area. There have been remote bilateral smaller cerebellar infarcts. Small remote bilateral occipital and parietal cortically based infarcts. Mild chronic small vessel ischemic change in the cerebral white matter. Brain volume is normal Focus of chronic blood products along the lateral right frontal lobe, nonspecific in isolation. No masslike finding or hydrocephalus. Vascular: Major flow voids are preserved Skull and upper cervical spine: Negative for marrow lesion Sinuses/Orbits: Negative IMPRESSION: 1. 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 2. Multiple small remote posterior circulation infarcts, suggest vascular imaging. Electronically Signed   By: Monte Fantasia M.D.   On: 07/27/2018 14:56     All images have been reviewed by me personally.    Assessment/Plan Principal Problem:   Acute CVA (cerebrovascular accident) [facility]) Active Problems:   Hyperlipidemia   Essential hypertension   Trigeminal neuralgia   Osteoarthritis of both  hands    Left-sided arm weakness secondary to acute stroke, Left cerebellum Intermittent mild dizziness -admit forTelemetry monitoring -Stroke protocol -Allow for permissive hypertension for the first 24-48h - only treat PRN if SBP >220 mmHg. Blood pressures can be gradually normalized to SBP<140 upon discharge. -MRI brain without contrast -confirm stroke -Maintain Euthermia.  -ASA ordered.  -Echocardiogram -Lipid Panel, TSH and A1C -Frequent neuro checks -Atorvastatin PO within 24 hrs.  -Risk factor modification -Consult Neurology -PT/OT eval, Speech consult  Essential hypertension -Hold off on home medications  Peripheral neuropathy/trigeminal neuralgia -Continue gabapentin  GERD -PPI daily  Hyperlipidemia -On simvastatin 40 mg daily  Osteoarthritis -Pain control     DVT prophylaxis: Lovenox Code Status: Full code Family Communication: None at bedside Disposition Plan: To be determined Consults called: Neurology called by the ER provider who will see the patient in consultation at [facility] Admission status: Admit to [facility] for evaluation of acute stroke   Time Spent: 65 minutes.  >50% of the time was devoted to discussing the patients care, assessment, plan and disposition with other care givers along with  counseling the patient about the risks and benefits of treatment.    Kash Mothershead Arsenio Loader MD [facility]  If 7PM-7AM, please contact night-coverage [email or web address]  07/27/2018, 3:58 PM

## 2018-07-27 NOTE — ED Notes (Signed)
Hospitalist at bedside 

## 2018-07-27 NOTE — ED Notes (Signed)
Patient transported to CT 

## 2018-07-27 NOTE — Consult Note (Addendum)
NEURO HOSPITALIST  CONSULT   Requesting Physician: Dr. Reesa Chew    Chief Complaint: left arm weakness  History obtained from:  Patient   HPI:                                                                                                                                         Kelsey Sparks is an 73 y.o. female with PMH HTN, HLD, migraines, trigeminal neuralgia presented to [facility] for  C/o left arm weakness. Transferred to [facility] for Neurology after stroke confirmed on MRI.     Patient states that 4 days ago she started having some left arm weakness. She does report that she had a HA on Tuesday.  She did talk to her doctor she thought it was her vertigo "acting up". She was told to take her vertigo medication and in a few days she would be better. She did not feel any better today so  she decided that she should seek medical care. No previous stroke history. Denies any ETOH use, smoking or drug abuse, vision changes, dizziness, problems walking, CP, facial droop. Does endorse intermittent slurred speech.  She does take ASA 81mg  daily, and zonisamide at night for migraines.   Hospital course:  Covid-19 - negative CTH: no hemorrhage; left cerebellar acute/subacute infarct MRI: left cerebellar infarct; small remote posterior circulation infarcts Creatinine 1.27 BP: 147/80  Date last known well: 07/25/2018 ( 4 days ago) Time last known well: unknown tPA Given: No: outside of window Modified Rankin: Rankin Score=0 NIHSS:2, sensation, dysarthia  Past Medical History:  Diagnosis Date  . Adenomatous colon polyp   . Allergy   . Anemia   . Arthritis   . Constipation    pt uses metamucil  . Esophageal stricture   . GERD (gastroesophageal reflux disease)   . Hiatal hernia   . Hyperlipidemia   . Hypertension   . IBS (irritable bowel syndrome)   . Low back pain   . Macular degeneration   . Muscular degeneration   . Peripheral vascular disease [facility])      Past Surgical History:  Procedure Laterality Date  . BACK SURGERY    . CHOLECYSTECTOMY    . COLONOSCOPY    . KNEE SURGERY    . LUMBAR DISC SURGERY    . NECK SURGERY    . POLYPECTOMY    . right small toe surgery    . TOTAL ABDOMINAL HYSTERECTOMY W/ BILATERAL SALPINGOOPHORECTOMY    . UPPER GASTROINTESTINAL ENDOSCOPY      Family History  Problem Relation Age of Onset  . Arthritis Father   . Hypertension Father   .  Arthritis Mother   . Asthma Mother   . Breast cancer Maternal Aunt   . Colon cancer Neg Hx   . Colon polyps Neg Hx   . Rectal cancer Neg Hx   . Stomach cancer Neg Hx        Social History:  reports that she quit smoking about 10 years ago. She has a 12.50 pack-year smoking history. She has never used smokeless tobacco. She reports that she does not drink alcohol or use drugs.  Allergies:  Allergies  Allergen Reactions  . Atorvastatin Hives    Medications:                                                                                                                           Scheduled: . aspirin  300 mg Rectal Daily   Or  . aspirin  325 mg Oral Daily  . enoxaparin (LOVENOX) injection  40 mg Subcutaneous Q24H  . gabapentin  100 mg Oral TID  . pantoprazole  40 mg Oral Daily   Continuous: . sodium chloride     HUD:[id number] **OR** acetaminophen (TYLENOL) oral liquid 160 mg/5 mL **OR** acetaminophen, alum & mag hydroxide-simeth, hydrocortisone, hydrocortisone cream, lip balm, loratadine, Muscle Rub, phenol, polyethylene glycol, polyvinyl alcohol, senna-docusate, sodium chloride  ROS:                                                                                                                                       ROS was performed and is negative except as noted in HPI    General Examination:                                                                                                      Blood pressure (!) 141/87, pulse 74, temperature 98.7  F (37.1 C), temperature source Oral, resp. rate 16, SpO2 100 %.  HEENT-  Normocephalic, no lesions, without obvious abnormality.  Normal external eye and conjunctiva. Cardiovascular- S1-S2 audible,  pulses palpable throughout  Lungs-no rhonchi or wheezing noted, no excessive working breathing.  Saturations within normal limits on RA Extremities- Warm, dry and intact Musculoskeletal-no joint tenderness, deformity or swelling Skin-warm and dry, no hyperpigmentation, vitiligo, or suspicious lesions  Neurological Examination Mental Status: Alert, oriented, thought content appropriate.  Speech fluent without evidence of aphasia.  Able to followcommands without difficulty. Naming intact, slight dysarthria noted.  Cranial Nerves: II: Visual fields grossly normal,  III,IV, VI: ptosis not present, extra-ocular motions intact bilaterally, pupils equal, round, reactive to light and accommodation V,VII: smile symmetric, facial light touch sensation normal bilaterally VIII: hearing normal bilaterally IX,X: uvula rises midline XI: bilateral shoulder shrug XII: midline tongue extension Motor: Right : Upper extremity   5/5  Left:     Upper extremity   5/5  Lower extremity   5/5   Lower extremity   5/5 Tone and bulk:normal tone throughout; no atrophy noted Sensory:  light touch intact except left leg reports some decreased sensation. Deep Tendon Reflexes: 2+ and symmetric biceps and patella Plantars: Right: downgoing   Left: downgoing Cerebellar: She has some clumsiness on the left Gait: deferred   Lab Results: Basic Metabolic Panel: Recent Labs  Lab 07/27/18 1343 07/27/18 1543  NA 140  --   K 3.8  --   CL 107  --   CO2 25  --   GLUCOSE 94  --   BUN 19  --   CREATININE 1.38* 1.27*  CALCIUM 9.3  --     CBC: Recent Labs  Lab 07/27/18 1343 07/27/18 1543  WBC 7.1 7.5  NEUTROABS 4.5  --   HGB 11.4* 11.3*  HCT 35.1* 33.6*  MCV 93.1 92.6  PLT 176 171    Imaging: Ct Head Wo  Contrast  Result Date: 07/27/2018 CLINICAL DATA:  Focal neurologic deficit greater than 6 hours. Left upper extremity numbness and heaviness. EXAM: CT HEAD WITHOUT CONTRAST CT CERVICAL SPINE WITHOUT CONTRAST TECHNIQUE: Multidetector CT imaging of the head and cervical spine was performed following the standard protocol without intravenous contrast. Multiplanar CT image reconstructions of the cervical spine were also generated. COMPARISON:  10/27/2010 head CT. FINDINGS: CT HEAD FINDINGS Brain: Rounded focus of cortical hypodensity in the anterior left cerebellar hemisphere with associated mild local sulcal effacement, new since 2012 head CT. Small focus of encephalomalacia in posteromedial right cerebellar hemisphere, favor remote infarct. No evidence of parenchymal hemorrhage or extra-axial fluid collection. No mass lesion, mass effect, or midline shift. Nonspecific mild subcortical and periventricular white matter hypodensity, most in keeping with chronic small vessel ischemic change. Cerebral volume is age appropriate. No ventriculomegaly. Vascular: No acute abnormality. Skull: No evidence of calvarial fracture. Sinuses/Orbits: The visualized paranasal sinuses are essentially clear. Other:  The mastoid air cells are unopacified. CT CERVICAL SPINE FINDINGS Alignment: Straightening of the cervical spine. No facet subluxation. Dens is well positioned between the lateral masses of C1. Minimal 2 mm retrolisthesis C5-6. Minimal 2 mm anterolisthesis C7-T1. Skull base and vertebrae: No acute fracture. No primary bone lesion or focal pathologic process. Soft tissues and spinal canal: No prevertebral edema. No visible canal hematoma. Disc levels: Status post ACDF at C6-7, with no evidence of hardware fracture or loosening. Bony fusion of C6-7 disc space. Moderate to marked multilevel degenerative disc disease in the mid to lower cervical spine, most prominent C5-6. Mild-to-moderate bilateral facet arthropathy. Moderate  degenerative foraminal stenosis on the left at C5-6 and C6-7 predominantly due to uncovertebral hypertrophy. Mild-to-moderate degenerative foraminal stenosis on  the right at C5-6 due to uncovertebral hypertrophy. Upper chest: No acute abnormality. Other: Visualized mastoid air cells appear clear. Bilateral hypodense thyroid nodules, largest 1.9 cm on the left. No pathologically enlarged cervical nodes. IMPRESSION: 1. Rounded focus of cortical hypodensity in the anterior left cerebellar hemisphere with associated mild local sulcal effacement, new since 2012 head CT, most compatible with acute/early subacute left cerebellar infarct. 2. Small focus of encephalomalacia in the posteromedial right cerebellar hemisphere, favor remote infarct. 3. No acute intracranial hemorrhage. 4. Mild chronic small vessel ischemic changes in the cerebral white matter. 5. 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 ACDF C6-7 with no evidence of hardware complication. 6. Mild-to-moderate multilevel degenerative foraminal stenosis in the lower cervical spine as detailed. 7. Dominant 1.9 cm left thyroid lobe nodule. Thyroid ultrasound correlation warranted on a short term outpatient basis if not previously performed. This follows ACR consensus guidelines: Managing Incidental Thyroid Nodules Detected on Imaging: White Paper of the ACR Incidental Thyroid Findings Committee. J Am Coll Radiol 2015; 12:143-150. Critical Value/emergent results were called by telephone at the time of interpretation on 07/27/2018 at 2:20 pm to Dr. Varney Biles , who verbally acknowledged these results. Electronically Signed   By: Ilona Sorrel M.D.   On: 07/27/2018 14:29   Ct Cervical Spine Wo Contrast  Result Date: 07/27/2018 CLINICAL DATA:  Focal neurologic deficit greater than 6 hours. Left upper extremity numbness and heaviness. EXAM: CT HEAD WITHOUT CONTRAST CT CERVICAL SPINE WITHOUT CONTRAST TECHNIQUE:  Multidetector CT imaging of the head and cervical spine was performed following the standard protocol without intravenous contrast. Multiplanar CT image reconstructions of the cervical spine were also generated. COMPARISON:  10/27/2010 head CT. FINDINGS: CT HEAD FINDINGS Brain: Rounded focus of cortical hypodensity in the anterior left cerebellar hemisphere with associated mild local sulcal effacement, new since 2012 head CT. Small focus of encephalomalacia in posteromedial right cerebellar hemisphere, favor remote infarct. No evidence of parenchymal hemorrhage or extra-axial fluid collection. No mass lesion, mass effect, or midline shift. Nonspecific mild subcortical and periventricular white matter hypodensity, most in keeping with chronic small vessel ischemic change. Cerebral volume is age appropriate. No ventriculomegaly. Vascular: No acute abnormality. Skull: No evidence of calvarial fracture. Sinuses/Orbits: The visualized paranasal sinuses are essentially clear. Other:  The mastoid air cells are unopacified. CT CERVICAL SPINE FINDINGS Alignment: Straightening of the cervical spine. No facet subluxation. Dens is well positioned between the lateral masses of C1. Minimal 2 mm retrolisthesis C5-6. Minimal 2 mm anterolisthesis C7-T1. Skull base and vertebrae: No acute fracture. No primary bone lesion or focal pathologic process. Soft tissues and spinal canal: No prevertebral edema. No visible canal hematoma. Disc levels: Status post ACDF at C6-7, with no evidence of hardware fracture or loosening. Bony fusion of C6-7 disc space. Moderate to marked multilevel degenerative disc disease in the mid to lower cervical spine, most prominent C5-6. Mild-to-moderate bilateral facet arthropathy. Moderate degenerative foraminal stenosis on the left at C5-6 and C6-7 predominantly due to uncovertebral hypertrophy. Mild-to-moderate degenerative foraminal stenosis on the right at C5-6 due to uncovertebral hypertrophy. Upper  chest: No acute abnormality. Other: Visualized mastoid air cells appear clear. Bilateral hypodense thyroid nodules, largest 1.9 cm on the left. No pathologically enlarged cervical nodes. IMPRESSION: 1. Rounded focus of cortical hypodensity in the anterior left cerebellar hemisphere with associated mild local sulcal effacement, new since 2012 head CT, most compatible with acute/early subacute left cerebellar infarct. 2. Small focus of encephalomalacia in the posteromedial  right cerebellar hemisphere, favor remote infarct. 3. No acute intracranial hemorrhage. 4. Mild chronic small vessel ischemic changes in the cerebral white matter. 5. 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 ACDF C6-7 with no evidence of hardware complication. 6. Mild-to-moderate multilevel degenerative foraminal stenosis in the lower cervical spine as detailed. 7. Dominant 1.9 cm left thyroid lobe nodule. Thyroid ultrasound correlation warranted on a short term outpatient basis if not previously performed. This follows ACR consensus guidelines: Managing Incidental Thyroid Nodules Detected on Imaging: White Paper of the ACR Incidental Thyroid Findings Committee. J Am Coll Radiol 2015; 12:143-150. Critical Value/emergent results were called by telephone at the time of interpretation on 07/27/2018 at 2:20 pm to Dr. Varney Biles , who verbally acknowledged these results. Electronically Signed   By: Ilona Sorrel M.D.   On: 07/27/2018 14:29   Mr Brain Wo Contrast  Result Date: 07/27/2018 CLINICAL DATA:  Left hand weakness for 4 days EXAM: MRI HEAD WITHOUT CONTRAST TECHNIQUE: Multiplanar, multiecho pulse sequences of the brain and surrounding structures were obtained without intravenous contrast. COMPARISON:  Head CT from earlier today FINDINGS: Brain: Patchy restricted diffusion in the left mid to upper cerebellum involving a moderate to large area. There have been remote bilateral smaller cerebellar  infarcts. Small remote bilateral occipital and parietal cortically based infarcts. Mild chronic small vessel ischemic change in the cerebral white matter. Brain volume is normal Focus of chronic blood products along the lateral right frontal lobe, nonspecific in isolation. No masslike finding or hydrocephalus. Vascular: Major flow voids are preserved Skull and upper cervical spine: Negative for marrow lesion Sinuses/Orbits: Negative IMPRESSION: 1. 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 2. Multiple small remote posterior circulation infarcts, suggest vascular imaging. Electronically Signed   By: Monte Fantasia M.D.   On: 07/27/2018 14:56       Laurey Morale, MSN, NP-C [facility] [phone]  07/27/2018, 5:50 PM   Attending physician note to follow with Assessment and plan . I have seen the patient and reviewed the above note.  Assessment: 73 y.o. female with PMH HTN, HLD, trigeminal neuralgia with moderate left cerebellar infarct.  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  She will need further work-up and therapy.  Stroke Risk Factors - hyperlipidemia and hypertenn    Recommendations: -- BP goal : Permissive HTN upto 220/110 mmHg  --MRI Brain ( completed) --CTA head and neck  --Echocardiogram -- ASA -- High intensity Statin if LDL > 70 -- HgbA1c, fasting lipid panel -- PT consult, OT consult, Speech consult --Telemetry monitoring --Frequent neuro checks --Stroke swallow screen  --please page stroke NP  Or  PA  Or MD from 8am -4 pm  as this patient from this time will be  followed by the stroke.   You can look them up on [email or web address]  Password [email or web address]  Roland Rack, MD [facility] [phone]  If 7pm- 7am, please page neurology on call as listed in [place].

## 2018-07-27 NOTE — ED Provider Notes (Signed)
[facility] DEPT Provider Note   CSN: [id number] Arrival date & time: 07/27/18  1204    History   Chief Complaint Chief Complaint  Patient presents with   Weakness    HPI Kelsey Sparks is a 73 y.o. female.     HPI 73 year old female comes in a chief complaint of left-sided heaviness and weakness. Patient has history of hypertension, hyperlipidemia.  She has no history of strokes or CAD.  She reports that 2 days ago she had severe episode of vertigo.  She has had vertigo in the past and she took medications for her symptoms.  Her vertigo has improved, but then she started having left-sided weakness and heaviness that has not resolved.  Her symptoms have been distal to the elbow.  She continues to have some unsteady gait, but the symptoms of dizziness have improved slightly.   Past Medical History:  Diagnosis Date   Adenomatous colon polyp    Allergy    Anemia    Arthritis    Constipation    pt uses metamucil   Esophageal stricture    GERD (gastroesophageal reflux disease)    Hiatal hernia    Hyperlipidemia    Hypertension    IBS (irritable bowel syndrome)    Low back pain    Macular degeneration    Muscular degeneration    Peripheral vascular disease [facility])     Patient Active Problem List   Diagnosis Date Noted   Osteopenia 08/14/2016   Primary osteoarthritis of right knee 11/24/2015   Lumbar radiculopathy, chronic 10/19/2015   Lumbar spondylosis with myelopathy 10/19/2015   Tendinitis of left rotator cuff 07/20/2015   Osteoarthritis, shoulder 02/24/2015   Osteoarthritis of both hands 02/24/2015   Osteoarthritis of left knee 02/24/2015   Skin tag of vulva 10/10/2012   Dense breasts 08/05/2012   Trigeminal neuralgia 11/03/2010   VAGINITIS, ATROPHIC 09/09/2009   ANEMIA, PERNICIOUS 06/27/2007   PERIPHERAL VASCULAR DISEASE 06/25/2007   ALLERGIC RHINITIS 06/25/2007   Hyperlipidemia 06/04/2007    Essential hypertension 06/04/2007   Arthropathy 06/04/2007    Past Surgical History:  Procedure Laterality Date   BACK SURGERY     CHOLECYSTECTOMY     COLONOSCOPY     KNEE SURGERY     LUMBAR [place]     POLYPECTOMY     right small toe surgery     TOTAL ABDOMINAL HYSTERECTOMY W/ BILATERAL SALPINGOOPHORECTOMY     UPPER GASTROINTESTINAL ENDOSCOPY       OB History   No obstetric history on file.      Home Medications    Prior to Admission medications   Medication Sig Start Date End Date Taking? Authorizing Provider  aspirin 81 MG tablet Take 81 mg by mouth daily.    [provider]  atenolol (TENORMIN) 50 MG tablet TAKE 1 TABLET(50 MG) BY MOUTH EVERY MORNING 06/05/18   Nafziger, Tommi Rumps, NP  baclofen (LIORESAL) 10 MG tablet Take 10 mg by mouth 2 (two) times daily.    Orie Rout, MD  cetirizine (ZYRTEC) 10 MG tablet Take 10 mg by mouth daily.    [provider]  conjugated estrogens (PREMARIN) vaginal cream 0.5 applicatorful vaginally twice weekly. 04/26/18   Nafziger, Tommi Rumps, NP  fluticasone Asencion Islam) 50 MCG/ACT nasal spray instill 2 sprays into each nostril once daily 06/05/18   Nafziger, Tommi Rumps, NP  gabapentin (NEURONTIN) 100 MG capsule Take 1 capsule (100 mg total) by mouth 3 (three) times  daily. 12/11/17   Meredith Staggers, MD  hydrocortisone (ANUSOL-HC) 25 MG suppository Place 1 suppository (25 mg total) rectally 2 (two) times daily as needed for hemorrhoids or itching. 09/26/16   Nafziger, Tommi Rumps, NP  meclizine (ANTIVERT) 25 MG tablet TAKE 1/2 TO 1 TABLET EVERY 6 HOURS IF NEEDED 03/22/16   Nafziger, Tommi Rumps, NP  meloxicam (MOBIC) 15 MG tablet Take 1 tablet (15 mg total) by mouth daily. 06/11/18   Bayard Hugger, NP  Polyethyl Glycol-Propyl Glycol (SYSTANE) 0.4-0.3 % SOLN Apply 2 drops to eye 2 (two) times daily. 04/27/17   Nafziger, Tommi Rumps, NP  RABEprazole (ACIPHEX) 20 MG tablet Take 1 tablet (20 mg total) by mouth daily. 06/05/18    Nafziger, Tommi Rumps, NP  simvastatin (ZOCOR) 40 MG tablet Take 1 tablet (40 mg total) by mouth daily. 06/05/18   Nafziger, Tommi Rumps, NP  traMADol Veatrice Bourbon) 50 MG tablet Take 1-2 tabletsby mouth every 8 hours for pain 07/23/18   Meredith Staggers, MD  triamterene-hydrochlorothiazide (MAXZIDE-25) 37.5-25 MG tablet Take 1 tablet by mouth every morning. 06/05/18   Nafziger, Tommi Rumps, NP  VOLTAREN 1 % GEL Apply 2 g topically 4 (four) times daily. 01/03/18   Meredith Staggers, MD  zonisamide (ZONEGRAN) 25 MG capsule Take 50 mg by mouth daily. Increase as directed; for Migraines 10/21/14   Orie Rout, MD  ferrous sulfate 325 (65 FE) MG tablet Take 325 mg by mouth daily with breakfast.    06/21/11  [provider]    Family History Family History  Problem Relation Age of Onset   Arthritis Father    Hypertension Father    Arthritis Mother    Asthma Mother    Breast cancer Maternal Aunt    Colon cancer Neg Hx    Colon polyps Neg Hx    Rectal cancer Neg Hx    Stomach cancer Neg Hx     Social History Social History   Tobacco Use   Smoking status: Former Smoker    Packs/day: 0.50    Years: 25.00    Pack years: 12.50    Last attempt to quit: 03/27/2008    Years since quitting: 10.3   Smokeless tobacco: Never Used  Substance Use Topics   Alcohol use: No   Drug use: No     Allergies   Atorvastatin   Review of Systems Review of Systems  Constitutional: Positive for activity change.  Respiratory: Negative for shortness of breath.   Cardiovascular: Negative for chest pain.  Neurological: Positive for weakness, light-headedness and numbness.  All other systems reviewed and are negative.    Physical Exam Updated Vital Signs BP 122/82    Pulse 75    Temp 98.7 F (37.1 C) (Oral)    Resp 17    SpO2 98%   Physical Exam Vitals signs and nursing note reviewed.  Constitutional:      Appearance: She is well-developed.  HENT:     Head: Normocephalic and atraumatic.  Neck:       Musculoskeletal: Normal range of motion and neck supple.  Cardiovascular:     Rate and Rhythm: Normal rate.  Pulmonary:     Effort: Pulmonary effort is normal.  Abdominal:     General: Bowel sounds are normal.  Musculoskeletal: Normal range of motion.        General: No swelling or tenderness.  Skin:    General: Skin is warm and dry.  Neurological:     Mental Status: She is alert and oriented to  person, place, and time.     Comments: Subjective numbness over the left side.  I did not appreciate any vertical nystagmus, however patient did have saccadic eye movement with right-sided gaze. Cerebellar exam did not reveal any dysmetria Upper extremity and lower extremity strength is 4+ out of 5 and equal. The grip does appear slightly weak on the left side      ED Treatments / Results  Labs (all labs ordered are listed, but only abnormal results are displayed) Labs Reviewed  CBC - Abnormal; Notable for the following components:      Result Value   RBC 3.77 (*)    Hemoglobin 11.4 (*)    HCT 35.1 (*)    All other components within normal limits  COMPREHENSIVE METABOLIC PANEL - Abnormal; Notable for the following components:   Creatinine, Ser 1.38 (*)    GFR calc non Af Amer 39 (*)    GFR calc Af Amer 45 (*)    All other components within normal limits  URINALYSIS, ROUTINE W REFLEX MICROSCOPIC - Abnormal; Notable for the following components:   Bacteria, UA FEW (*)    All other components within normal limits  SARS CORONAVIRUS 2 (HOSPITAL ORDER, [facility])  PROTIME-INR  APTT  DIFFERENTIAL    EKG EKG Interpretation  Date/Time:  Saturday Jul 27 2018 13:44:08 EDT Ventricular Rate:  78 PR Interval:    QRS Duration: 96 QT Interval:  404 QTC Calculation: 461 R Axis:   31 Text Interpretation:  Sinus rhythm Abnormal R-wave progression, early transition No acute changes No significant change since last tracing Confirmed by Varney Biles [phone])  on 07/27/2018 2:49:12 PM   Radiology Ct Head Wo Contrast  Result Date: 07/27/2018 CLINICAL DATA:  Focal neurologic deficit greater than 6 hours. Left upper extremity numbness and heaviness. EXAM: CT HEAD WITHOUT CONTRAST CT CERVICAL SPINE WITHOUT CONTRAST TECHNIQUE: Multidetector CT imaging of the head and cervical spine was performed following the standard protocol without intravenous contrast. Multiplanar CT image reconstructions of the cervical spine were also generated. COMPARISON:  10/27/2010 head CT. FINDINGS: CT HEAD FINDINGS Brain: Rounded focus of cortical hypodensity in the anterior left cerebellar hemisphere with associated mild local sulcal effacement, new since 2012 head CT. Small focus of encephalomalacia in posteromedial right cerebellar hemisphere, favor remote infarct. No evidence of parenchymal hemorrhage or extra-axial fluid collection. No mass lesion, mass effect, or midline shift. Nonspecific mild subcortical and periventricular white matter hypodensity, most in keeping with chronic small vessel ischemic change. Cerebral volume is age appropriate. No ventriculomegaly. Vascular: No acute abnormality. Skull: No evidence of calvarial fracture. Sinuses/Orbits: The visualized paranasal sinuses are essentially clear. Other:  The mastoid air cells are unopacified. CT CERVICAL SPINE FINDINGS Alignment: Straightening of the cervical spine. No facet subluxation. Dens is well positioned between the lateral masses of C1. Minimal 2 mm retrolisthesis C5-6. Minimal 2 mm anterolisthesis C7-T1. Skull base and vertebrae: No acute fracture. No primary bone lesion or focal pathologic process. Soft tissues and spinal canal: No prevertebral edema. No visible canal hematoma. Disc levels: Status post ACDF at C6-7, with no evidence of hardware fracture or loosening. Bony fusion of C6-7 disc space. Moderate to marked multilevel degenerative disc disease in the mid to lower cervical spine, most prominent C5-6.  Mild-to-moderate bilateral facet arthropathy. Moderate degenerative foraminal stenosis on the left at C5-6 and C6-7 predominantly due to uncovertebral hypertrophy. Mild-to-moderate degenerative foraminal stenosis on the right at C5-6 due to uncovertebral hypertrophy. Upper chest:  No acute abnormality. Other: Visualized mastoid air cells appear clear. Bilateral hypodense thyroid nodules, largest 1.9 cm on the left. No pathologically enlarged cervical nodes. IMPRESSION: 1. Rounded focus of cortical hypodensity in the anterior left cerebellar hemisphere with associated mild local sulcal effacement, new since 2012 head CT, most compatible with acute/early subacute left cerebellar infarct. 2. Small focus of encephalomalacia in the posteromedial right cerebellar hemisphere, favor remote infarct. 3. No acute intracranial hemorrhage. 4. Mild chronic small vessel ischemic changes in the cerebral white matter. 5. 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 ACDF C6-7 with no evidence of hardware complication. 6. Mild-to-moderate multilevel degenerative foraminal stenosis in the lower cervical spine as detailed. 7. Dominant 1.9 cm left thyroid lobe nodule. Thyroid ultrasound correlation warranted on a short term outpatient basis if not previously performed. This follows ACR consensus guidelines: Managing Incidental Thyroid Nodules Detected on Imaging: White Paper of the ACR Incidental Thyroid Findings Committee. J Am Coll Radiol 2015; 12:143-150. Critical Value/emergent results were called by telephone at the time of interpretation on 07/27/2018 at 2:20 pm to Dr. Varney Biles , who verbally acknowledged these results. Electronically Signed   By: Ilona Sorrel M.D.   On: 07/27/2018 14:29   Ct Cervical Spine Wo Contrast  Result Date: 07/27/2018 CLINICAL DATA:  Focal neurologic deficit greater than 6 hours. Left upper extremity numbness and heaviness. EXAM: CT HEAD WITHOUT CONTRAST  CT CERVICAL SPINE WITHOUT CONTRAST TECHNIQUE: Multidetector CT imaging of the head and cervical spine was performed following the standard protocol without intravenous contrast. Multiplanar CT image reconstructions of the cervical spine were also generated. COMPARISON:  10/27/2010 head CT. FINDINGS: CT HEAD FINDINGS Brain: Rounded focus of cortical hypodensity in the anterior left cerebellar hemisphere with associated mild local sulcal effacement, new since 2012 head CT. Small focus of encephalomalacia in posteromedial right cerebellar hemisphere, favor remote infarct. No evidence of parenchymal hemorrhage or extra-axial fluid collection. No mass lesion, mass effect, or midline shift. Nonspecific mild subcortical and periventricular white matter hypodensity, most in keeping with chronic small vessel ischemic change. Cerebral volume is age appropriate. No ventriculomegaly. Vascular: No acute abnormality. Skull: No evidence of calvarial fracture. Sinuses/Orbits: The visualized paranasal sinuses are essentially clear. Other:  The mastoid air cells are unopacified. CT CERVICAL SPINE FINDINGS Alignment: Straightening of the cervical spine. No facet subluxation. Dens is well positioned between the lateral masses of C1. Minimal 2 mm retrolisthesis C5-6. Minimal 2 mm anterolisthesis C7-T1. Skull base and vertebrae: No acute fracture. No primary bone lesion or focal pathologic process. Soft tissues and spinal canal: No prevertebral edema. No visible canal hematoma. Disc levels: Status post ACDF at C6-7, with no evidence of hardware fracture or loosening. Bony fusion of C6-7 disc space. Moderate to marked multilevel degenerative disc disease in the mid to lower cervical spine, most prominent C5-6. Mild-to-moderate bilateral facet arthropathy. Moderate degenerative foraminal stenosis on the left at C5-6 and C6-7 predominantly due to uncovertebral hypertrophy. Mild-to-moderate degenerative foraminal stenosis on the right at  C5-6 due to uncovertebral hypertrophy. Upper chest: No acute abnormality. Other: Visualized mastoid air cells appear clear. Bilateral hypodense thyroid nodules, largest 1.9 cm on the left. No pathologically enlarged cervical nodes. IMPRESSION: 1. Rounded focus of cortical hypodensity in the anterior left cerebellar hemisphere with associated mild local sulcal effacement, new since 2012 head CT, most compatible with acute/early subacute left cerebellar infarct. 2. Small focus of encephalomalacia in the posteromedial right cerebellar hemisphere, favor remote infarct. 3. No acute intracranial  hemorrhage. 4. Mild chronic small vessel ischemic changes in the cerebral white matter. 5. 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 ACDF C6-7 with no evidence of hardware complication. 6. Mild-to-moderate multilevel degenerative foraminal stenosis in the lower cervical spine as detailed. 7. Dominant 1.9 cm left thyroid lobe nodule. Thyroid ultrasound correlation warranted on a short term outpatient basis if not previously performed. This follows ACR consensus guidelines: Managing Incidental Thyroid Nodules Detected on Imaging: White Paper of the ACR Incidental Thyroid Findings Committee. J Am Coll Radiol 2015; 12:143-150. Critical Value/emergent results were called by telephone at the time of interpretation on 07/27/2018 at 2:20 pm to Dr. Varney Biles , who verbally acknowledged these results. Electronically Signed   By: Ilona Sorrel M.D.   On: 07/27/2018 14:29   Mr Brain Wo Contrast  Result Date: 07/27/2018 CLINICAL DATA:  Left hand weakness for 4 days EXAM: MRI HEAD WITHOUT CONTRAST TECHNIQUE: Multiplanar, multiecho pulse sequences of the brain and surrounding structures were obtained without intravenous contrast. COMPARISON:  Head CT from earlier today FINDINGS: Brain: Patchy restricted diffusion in the left mid to upper cerebellum involving a moderate to large area. There  have been remote bilateral smaller cerebellar infarcts. Small remote bilateral occipital and parietal cortically based infarcts. Mild chronic small vessel ischemic change in the cerebral white matter. Brain volume is normal Focus of chronic blood products along the lateral right frontal lobe, nonspecific in isolation. No masslike finding or hydrocephalus. Vascular: Major flow voids are preserved Skull and upper cervical spine: Negative for marrow lesion Sinuses/Orbits: Negative IMPRESSION: 1. 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 2. Multiple small remote posterior circulation infarcts, suggest vascular imaging. Electronically Signed   By: Monte Fantasia M.D.   On: 07/27/2018 14:56    Procedures Procedures (including critical care time)  Medications Ordered in ED Medications - No data to display   Initial Impression / Assessment and Plan / ED Course  I have reviewed the triage vital signs and the nursing notes.  Pertinent labs & imaging results that were available during my care of the patient were reviewed by me and considered in my medical decision making (see chart for details).        73 year old female comes in a chief complaint of dizziness and left-sided heaviness. Clinical suspicion is high for an acute stroke.  On neck exam she does not have any evidence of pain, however radiculopathy is another possibility.  CT head ordered and it reveals cerebellar stroke with mild edema.  Symptoms are consistent with the radiographic finding.  I spoke with neurology, they would like patient to be admitted at [facility].  Hospitalist to admit.  Final Clinical Impressions(s) / ED Diagnoses   Final diagnoses:  Acute ischemic stroke [facility])    ED Discharge Orders    None       Varney Biles, MD 07/27/18 1529

## 2018-07-27 NOTE — ED Notes (Signed)
Patient transported to MRI 

## 2018-07-27 NOTE — ED Notes (Signed)
Code stroke cancelled per Dr Kathrynn Humble.

## 2018-07-28 ENCOUNTER — Encounter (HOSPITAL_COMMUNITY): Payer: Self-pay | Admitting: Radiology

## 2018-07-28 ENCOUNTER — Inpatient Hospital Stay (HOSPITAL_COMMUNITY): Payer: Medicare Other

## 2018-07-28 DIAGNOSIS — M19042 Primary osteoarthritis, left hand: Secondary | ICD-10-CM

## 2018-07-28 DIAGNOSIS — I671 Cerebral aneurysm, nonruptured: Secondary | ICD-10-CM

## 2018-07-28 DIAGNOSIS — M19041 Primary osteoarthritis, right hand: Secondary | ICD-10-CM

## 2018-07-28 DIAGNOSIS — E785 Hyperlipidemia, unspecified: Secondary | ICD-10-CM

## 2018-07-28 DIAGNOSIS — I639 Cerebral infarction, unspecified: Secondary | ICD-10-CM

## 2018-07-28 LAB — ECHOCARDIOGRAM LIMITED BUBBLE STUDY
Height: 64 in
Weight: 2539.7 oz

## 2018-07-28 MED ORDER — CLOPIDOGREL BISULFATE 75 MG PO TABS
75.0000 mg | ORAL_TABLET | Freq: Every day | ORAL | Status: DC
  Administered 2018-07-28 – 2018-07-29 (×2): 75 mg via ORAL
  Filled 2018-07-28 (×2): qty 1

## 2018-07-28 MED ORDER — TRAZODONE HCL 50 MG PO TABS
50.0000 mg | ORAL_TABLET | Freq: Once | ORAL | Status: AC
  Administered 2018-07-28: 50 mg via ORAL
  Filled 2018-07-28: qty 1

## 2018-07-28 MED ORDER — IOHEXOL 350 MG/ML SOLN
75.0000 mL | Freq: Once | INTRAVENOUS | Status: AC | PRN
  Administered 2018-07-28: 75 mL via INTRAVENOUS

## 2018-07-28 MED ORDER — ASPIRIN EC 81 MG PO TBEC
81.0000 mg | DELAYED_RELEASE_TABLET | Freq: Every day | ORAL | Status: DC
  Administered 2018-07-29: 11:00:00 81 mg via ORAL
  Filled 2018-07-28: qty 1

## 2018-07-28 NOTE — Evaluation (Signed)
Speech Language Pathology Evaluation Patient Details Name: Kelsey Sparks MRN: [id number] DOB: November 28, 1945 Today's Date: 07/28/2018 Time: 1000-1020 SLP Time Calculation (min) (ACUTE ONLY): 20 min  Problem List:  Patient Active Problem List   Diagnosis Date Noted  . Acute CVA (cerebrovascular accident) (Early) 07/27/2018  . Osteopenia 08/14/2016  . Primary osteoarthritis of right knee 11/24/2015  . Lumbar radiculopathy, chronic 10/19/2015  . Lumbar spondylosis with myelopathy 10/19/2015  . Tendinitis of left rotator cuff 07/20/2015  . 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 Osteoarthritis of both hands 02/24/2015  . Osteoarthritis of left knee 02/24/2015  . Skin tag of vulva 10/10/2012  . Dense breasts 08/05/2012  . Trigeminal neuralgia 11/03/2010  . VAGINITIS, ATROPHIC 09/09/2009  . ANEMIA, PERNICIOUS 06/27/2007  . PERIPHERAL VASCULAR DISEASE 06/25/2007  . ALLERGIC RHINITIS 06/25/2007  . Hyperlipidemia 06/04/2007  . Essential hypertension 06/04/2007  . Arthropathy 06/04/2007   Past Medical History:  Past Medical History:  Diagnosis Date  . Adenomatous colon polyp   . Allergy   . Anemia   . Arthritis   . Constipation    pt uses metamucil  . Esophageal stricture   . GERD (gastroesophageal reflux disease)   . Hiatal hernia   . Hyperlipidemia   . Hypertension   . IBS (irritable bowel syndrome)   . Low back pain   . Macular degeneration   . Muscular degeneration   . Peripheral vascular disease [facility])    Past Surgical History:  Past Surgical History:  Procedure Laterality Date  . BACK SURGERY    . CHOLECYSTECTOMY    . COLONOSCOPY    . KNEE SURGERY    . LUMBAR DISC SURGERY    . NECK SURGERY    . POLYPECTOMY    . right small toe surgery    . TOTAL ABDOMINAL HYSTERECTOMY W/ BILATERAL SALPINGOOPHORECTOMY    . UPPER GASTROINTESTINAL ENDOSCOPY     HPI:  Kelsey Sparks is a 73 y.o. female with medical history significant of with past medical history of essential  hypertension, hyperlipidemia, GERD, allergic rhinitis, trigeminal neuralgia came to the hospital for evaluation of left-sided arm weakness.  Patient states about 4 days ago she started experiencing left upper extremity weakness.  She did not seek medical attention despite of her niece insisting upon evaluation.  Finally today she decided to get evaluated.  She denies having previous stroke or similar ymptoms in the past.  Reports of medication compliance.  Denies any tobacco, alcohol and illicit drug use In the ER CT of the head and MRI of the brain confirmed acute CVA in the left cerebellum and multiple remote posterior circulation infarcts.   Assessment / Plan / Recommendation Clinical Impression  Cognitive/linguistic and motor speech evaluation were completed.  The patient's speech was clear and easy to understand.  No discernible dysarthria or apraxia was noted.  Cranial nerve exam was unremarkable.  Lingual, labial, facial and jaw range of motion and strength were adequate.  She achieved a score of 26/30 on the Mini Mental State Exam.  She was oriented to person, place, time and situation.  She had good immediate recall of three novel words and her language skills appeared to be intact.  She was able to easily converse regarding her current hospital stay, name objects, repeat a phrase, follow a 3 step direction, read/comprehend a sentence and write a sentence.  She was able to provide logical solutions to simple problems.  Mild deficits were noted for delayed recall (ie 2/3 independently), although, semantic cue  facilitated recall of the third word.   She had mild issues with the attention task although her attention to task appeared to be adequate.  She also had mild issues copying a design.  She was unable to accurately complete the clock drawing task.  She was able to draw the circle but numbers 1-8 were placed on the right side of the clock with better spacing of the remaining numbers.  She was also  unable to place the hands on the clock correctly.  ST will not follow during acute stay but suggest consideration of a HHST evaluation for home safety.      SLP Assessment  SLP Recommendation/Assessment: All further Speech Lanaguage Pathology  needs can be addressed in the next venue of care(consider HHST eval for home safety) SLP Visit Diagnosis: Frontal lobe and executive function deficit Frontal lobe and executive function deficit following: Cerebral infarction    Follow Up Recommendations  Consider Home health SLP(for home safety evaluation)          SLP Evaluation Cognition  Overall Cognitive Status: Within Functional Limits for tasks assessed Arousal/Alertness: Awake/alert Orientation Level: Oriented X4 Attention: Sustained Sustained Attention: Appears intact Memory: Appears intact Awareness: Appears intact Problem Solving: Appears intact Safety/Judgment: Appears intact       Comprehension  Auditory Comprehension Overall Auditory Comprehension: Appears within functional limits for tasks assessed Commands: Within Functional Limits Conversation: Complex Reading Comprehension Reading Status: Within funtional limits    Expression Expression Primary Mode of Expression: Verbal Verbal Expression Initiation: No impairment Automatic Speech: Name;Social Response Level of Generative/Spontaneous Verbalization: Conversation Repetition: No impairment Naming: No impairment Pragmatics: No impairment Written Expression Dominant Hand: Right Written Expression: Within Functional Limits   Oral / Motor  Oral Motor/Sensory Function Overall Oral Motor/Sensory Function: Within functional limits Motor Speech Overall Motor Speech: Appears within functional limits for tasks assessed Respiration: Within functional limits Phonation: Normal Resonance: Within functional limits Articulation: Within functional limitis Intelligibility: Intelligible Motor Planning: Witnin functional  limits Motor Speech Errors: Not applicable   GO                   Shelly Flatten, MA, CCC-SLP Acute Rehab SLP [place] 07/28/2018, 10:39 AM

## 2018-07-28 NOTE — Progress Notes (Signed)
VASCULAR LAB PRELIMINARY  PRELIMINARY  PRELIMINARY  PRELIMINARY  Bilateral lower extremity venous duplex completed.    Preliminary report:  See CV proc for preliminary results.  Permelia Bamba, RVT 07/28/2018, 12:56 PM

## 2018-07-28 NOTE — Plan of Care (Signed)
Patient stable, discussed POC with patient, modifiable risk factors discussed, agreeable with plan, denies question/concerns at this time.

## 2018-07-28 NOTE — Progress Notes (Signed)
PROGRESS NOTE    Kelsey Sparks   SAY:[id number]  DOB: 1945-06-08  DOA: 07/27/2018 PCP: Dorothyann Peng, NP   Brief Narrative:  Kelsey Sparks is a 73 y.o. female with medical history significant of with past medical history of essential hypertension, hyperlipidemia, GERD, allergic rhinitis, trigeminal neuralgia came to the hospital for evaluation of vertigo followed by left-sided arm weakness for the past 4 days. He presented to [facility] and was transferred to [facility].  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   Multiple small remote posterior circulation infarcts, suggest vascular imaging.   Subjective: She feels that her left arm is still a little weak. She has no other complaints.     Assessment & Plan:   Principal Problem:   Acute CVA (cerebrovascular accident) - suspected to be embolic- work up in progress- awaiting ECHO - CTA head/neck > 6 mm left supraclinoid ICA aneurysm. - currently on full dose ASA   Active Problems:   Hyperlipidemia - on Simvastatin at home    Essential hypertension - on Atenolol and Maxzide     Trigeminal neuralgia -on Neurontin    Osteoarthritis of both hands - on Mobic  GERD - on Aciphex at home  Time spent in minutes: 35 DVT prophylaxis: Lovenox Code Status: Full code Family Communication:  Disposition Plan: await PT eval Consultants:   Neuro Procedures:   Awaiting ECHO Antimicrobials:  Anti-infectives (From admission, onward)   None       Objective: Vitals:   07/27/18 2024 07/27/18 2335 07/28/18 0231 07/28/18 0420  BP: 118/75 126/84 101/68 124/70  Pulse: 76 76 80 83  Resp: 18 18  17   Temp: 98.2 F (36.8 C) 98.2 F (36.8 C) 97.8 F (36.6 C) 98.1 F (36.7 C)  TempSrc: Oral Oral Oral Oral  SpO2: 98% 99% 99% 100%  Weight:      Height:        Intake/Output Summary (Last 24 hours) at 07/28/2018 0746 Last data filed at 07/28/2018 0400 Gross per 24 hour  Intake 476.52 ml  Output -  Net 476.52 ml    Filed Weights   07/27/18 1758  Weight: 72 kg    Examination: General exam: Appears comfortable  HEENT: PERRLA, oral mucosa moist, no sclera icterus or thrush Respiratory system: Clear to auscultation. Respiratory effort normal. Cardiovascular system: S1 & S2 heard, RRR.   Gastrointestinal system: Abdomen soft, non-tender, nondistended. Normal bowel sounds. Central nervous system: Alert and oriented. Left arm weakness and apraxia noted Extremities: No cyanosis, clubbing or edema Skin: No rashes or ulcers Psychiatry:  Mood & affect appropriate.     Data Reviewed: I have personally reviewed following labs and imaging studies  CBC: Recent Labs  Lab 07/27/18 1343 07/27/18 1543  WBC 7.1 7.5  NEUTROABS 4.5  --   HGB 11.4* 11.3*  HCT 35.1* 33.6*  MCV 93.1 92.6  PLT 176 235   Basic Metabolic Panel: Recent Labs  Lab 07/27/18 1343 07/27/18 1543  NA 140  --   K 3.8  --   CL 107  --   CO2 25  --   GLUCOSE 94  --   BUN 19  --   CREATININE 1.38* 1.27*  CALCIUM 9.3  --    GFR: Estimated Creatinine Clearance: 40.1 mL/min (A) (by C-G formula based on SCr of 1.27 mg/dL (H)). Liver Function Tests: Recent Labs  Lab 07/27/18 1343  AST 15  ALT 12  ALKPHOS 105  BILITOT 0.5  PROT 7.5  ALBUMIN 4.1   No results for input(s): LIPASE, AMYLASE in the last 168 hours. No results for input(s): AMMONIA in the last 168 hours. Coagulation Profile: Recent Labs  Lab 07/27/18 1343  INR 0.9   Cardiac Enzymes: No results for input(s): CKTOTAL, CKMB, CKMBINDEX, TROPONINI in the last 168 hours. BNP (last 3 results) No results for input(s): PROBNP in the last 8760 hours. HbA1C: No results for input(s): HGBA1C in the last 72 hours. CBG: No results for input(s): GLUCAP in the last 168 hours. Lipid Profile: No results for input(s): CHOL, HDL, LDLCALC, TRIG, CHOLHDL, LDLDIRECT in the last 72 hours. Thyroid Function Tests: Recent Labs    07/27/18 1543  TSH 1.289   Anemia Panel:  No results for input(s): VITAMINB12, FOLATE, FERRITIN, TIBC, IRON, RETICCTPCT in the last 72 hours. Urine analysis:    Component Value Date/Time   COLORURINE YELLOW 07/27/2018 1309   APPEARANCEUR CLEAR 07/27/2018 1309   LABSPEC 1.010 07/27/2018 1309   PHURINE 7.0 07/27/2018 1309   GLUCOSEU NEGATIVE 07/27/2018 1309   HGBUR NEGATIVE 07/27/2018 1309   HGBUR negative 09/15/2009 0901   BILIRUBINUR NEGATIVE 07/27/2018 1309   BILIRUBINUR n 12/07/2015 1609   KETONESUR NEGATIVE 07/27/2018 1309   PROTEINUR NEGATIVE 07/27/2018 1309   UROBILINOGEN 1.0 12/07/2015 1609   UROBILINOGEN 1.0 09/15/2009 0901   NITRITE NEGATIVE 07/27/2018 1309   LEUKOCYTESUR NEGATIVE 07/27/2018 1309   Sepsis Labs: @LABRCNTIP (procalcitonin:4,lacticidven:4) ) Recent Results (from the past 240 hour(s))  SARS Coronavirus 2 [facility] order, Performed in [place] hospital lab)     Status: None   Collection Time: 07/27/18  2:30 PM  Result Value Ref Range Status   SARS Coronavirus 2 NEGATIVE NEGATIVE Final    Comment: (NOTE) If result is NEGATIVE SARS-CoV-2 target nucleic acids are NOT DETECTED. The SARS-CoV-2 RNA is generally detectable in upper and lower  respiratory specimens during the acute phase of infection. The lowest  concentration of SARS-CoV-2 viral copies this assay can detect is 250  copies / mL. A negative result does not preclude SARS-CoV-2 infection  and should not be used as the sole basis for treatment or other  patient management decisions.  A negative result may occur with  improper specimen collection / handling, submission of specimen other  than nasopharyngeal swab, presence of viral mutation(s) within the  areas targeted by this assay, and inadequate number of viral copies  (<250 copies / mL). A negative result must be combined with clinical  observations, patient history, and epidemiological information. If result is POSITIVE SARS-CoV-2 target nucleic acids are DETECTED. The SARS-CoV-2  RNA is generally detectable in upper and lower  respiratory specimens dur ing the acute phase of infection.  Positive  results are indicative of active infection with SARS-CoV-2.  Clinical  correlation with patient history and other diagnostic information is  necessary to determine patient infection status.  Positive results do  not rule out bacterial infection or co-infection with other viruses. If result is PRESUMPTIVE POSTIVE SARS-CoV-2 nucleic acids MAY BE PRESENT.   A presumptive positive result was obtained on the submitted specimen  and confirmed on repeat testing.  While 2019 novel coronavirus  (SARS-CoV-2) nucleic acids may be present in the submitted sample  additional confirmatory testing may be necessary for epidemiological  and / or clinical management purposes  to differentiate between  SARS-CoV-2 and other Sarbecovirus currently known to infect humans.  If clinically indicated additional testing with an alternate test  methodology [phone]) is advised. The SARS-CoV-2 RNA is generally  detectable in upper and lower respiratory sp ecimens during the acute  phase of infection. The expected result is Negative. Fact Sheet for Patients:  StrictlyIdeas.no Fact Sheet for Healthcare Providers: BankingDealers.co.za This test is not yet approved or cleared by the [place] FDA and has been authorized for detection and/or diagnosis of SARS-CoV-2 by FDA under an Emergency Use Authorization (EUA).  This EUA will remain in effect (meaning this test can be used) for the duration of the COVID-19 declaration under Section 564(b)(1) of the Act, 21 U.S.C. section 360bbb-3(b)(1), unless the authorization is terminated or revoked sooner. Performed at [facility], [place] [address], [place], [place] [address]          Radiology Studies: Ct Head Wo Contrast  Result Date: 07/27/2018 CLINICAL DATA:  Focal neurologic  deficit greater than 6 hours. Left upper extremity numbness and heaviness. EXAM: CT HEAD WITHOUT CONTRAST CT CERVICAL SPINE WITHOUT CONTRAST TECHNIQUE: Multidetector CT imaging of the head and cervical spine was performed following the standard protocol without intravenous contrast. Multiplanar CT image reconstructions of the cervical spine were also generated. COMPARISON:  10/27/2010 head CT. FINDINGS: CT HEAD FINDINGS Brain: Rounded focus of cortical hypodensity in the anterior left cerebellar hemisphere with associated mild local sulcal effacement, new since 2012 head CT. Small focus of encephalomalacia in posteromedial right cerebellar hemisphere, favor remote infarct. No evidence of parenchymal hemorrhage or extra-axial fluid collection. No mass lesion, mass effect, or midline shift. Nonspecific mild subcortical and periventricular white matter hypodensity, most in keeping with chronic small vessel ischemic change. Cerebral volume is age appropriate. No ventriculomegaly. Vascular: No acute abnormality. Skull: No evidence of calvarial fracture. Sinuses/Orbits: The visualized paranasal sinuses are essentially clear. Other:  The mastoid air cells are unopacified. CT CERVICAL SPINE FINDINGS Alignment: Straightening of the cervical spine. No facet subluxation. Dens is well positioned between the lateral masses of C1. Minimal 2 mm retrolisthesis C5-6. Minimal 2 mm anterolisthesis C7-T1. Skull base and vertebrae: No acute fracture. No primary bone lesion or focal pathologic process. Soft tissues and spinal canal: No prevertebral edema. No visible canal hematoma. Disc levels: Status post ACDF at C6-7, with no evidence of hardware fracture or loosening. Bony fusion of C6-7 disc space. Moderate to marked multilevel degenerative disc disease in the mid to lower cervical spine, most prominent C5-6. Mild-to-moderate bilateral facet arthropathy. Moderate degenerative foraminal stenosis on the left at C5-6 and C6-7  predominantly due to uncovertebral hypertrophy. Mild-to-moderate degenerative foraminal stenosis on the right at C5-6 due to uncovertebral hypertrophy. Upper chest: No acute abnormality. Other: Visualized mastoid air cells appear clear. Bilateral hypodense thyroid nodules, largest 1.9 cm on the left. No pathologically enlarged cervical nodes. IMPRESSION: 1. Rounded focus of cortical hypodensity in the anterior left cerebellar hemisphere with associated mild local sulcal effacement, new since 2012 head CT, most compatible with acute/early subacute left cerebellar infarct. 2. Small focus of encephalomalacia in the posteromedial right cerebellar hemisphere, favor remote infarct. 3. No acute intracranial hemorrhage. 4. Mild chronic small vessel ischemic changes in the cerebral white matter. 5. 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 ACDF C6-7 with no evidence of hardware complication. 6. Mild-to-moderate multilevel degenerative foraminal stenosis in the lower cervical spine as detailed. 7. Dominant 1.9 cm left thyroid lobe nodule. Thyroid ultrasound correlation warranted on a short term outpatient basis if not previously performed. This follows ACR consensus guidelines: Managing Incidental Thyroid Nodules Detected on Imaging: White Paper of the ACR Incidental Thyroid Findings Committee.  J Am Coll Radiol 2015; 12:143-150. Critical Value/emergent results were called by telephone at the time of interpretation on 07/27/2018 at 2:20 pm to Dr. Varney Biles , who verbally acknowledged these results. Electronically Signed   By: Ilona Sorrel M.D.   On: 07/27/2018 14:29   Ct Cervical Spine Wo Contrast  Result Date: 07/27/2018 CLINICAL DATA:  Focal neurologic deficit greater than 6 hours. Left upper extremity numbness and heaviness. EXAM: CT HEAD WITHOUT CONTRAST CT CERVICAL SPINE WITHOUT CONTRAST TECHNIQUE: Multidetector CT imaging of the head and cervical spine was performed  following the standard protocol without intravenous contrast. Multiplanar CT image reconstructions of the cervical spine were also generated. COMPARISON:  10/27/2010 head CT. FINDINGS: CT HEAD FINDINGS Brain: Rounded focus of cortical hypodensity in the anterior left cerebellar hemisphere with associated mild local sulcal effacement, new since 2012 head CT. Small focus of encephalomalacia in posteromedial right cerebellar hemisphere, favor remote infarct. No evidence of parenchymal hemorrhage or extra-axial fluid collection. No mass lesion, mass effect, or midline shift. Nonspecific mild subcortical and periventricular white matter hypodensity, most in keeping with chronic small vessel ischemic change. Cerebral volume is age appropriate. No ventriculomegaly. Vascular: No acute abnormality. Skull: No evidence of calvarial fracture. Sinuses/Orbits: The visualized paranasal sinuses are essentially clear. Other:  The mastoid air cells are unopacified. CT CERVICAL SPINE FINDINGS Alignment: Straightening of the cervical spine. No facet subluxation. Dens is well positioned between the lateral masses of C1. Minimal 2 mm retrolisthesis C5-6. Minimal 2 mm anterolisthesis C7-T1. Skull base and vertebrae: No acute fracture. No primary bone lesion or focal pathologic process. Soft tissues and spinal canal: No prevertebral edema. No visible canal hematoma. Disc levels: Status post ACDF at C6-7, with no evidence of hardware fracture or loosening. Bony fusion of C6-7 disc space. Moderate to marked multilevel degenerative disc disease in the mid to lower cervical spine, most prominent C5-6. Mild-to-moderate bilateral facet arthropathy. Moderate degenerative foraminal stenosis on the left at C5-6 and C6-7 predominantly due to uncovertebral hypertrophy. Mild-to-moderate degenerative foraminal stenosis on the right at C5-6 due to uncovertebral hypertrophy. Upper chest: No acute abnormality. Other: Visualized mastoid air cells appear  clear. Bilateral hypodense thyroid nodules, largest 1.9 cm on the left. No pathologically enlarged cervical nodes. IMPRESSION: 1. Rounded focus of cortical hypodensity in the anterior left cerebellar hemisphere with associated mild local sulcal effacement, new since 2012 head CT, most compatible with acute/early subacute left cerebellar infarct. 2. Small focus of encephalomalacia in the posteromedial right cerebellar hemisphere, favor remote infarct. 3. No acute intracranial hemorrhage. 4. Mild chronic small vessel ischemic changes in the cerebral white matter. 5. 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 ACDF C6-7 with no evidence of hardware complication. 6. Mild-to-moderate multilevel degenerative foraminal stenosis in the lower cervical spine as detailed. 7. Dominant 1.9 cm left thyroid lobe nodule. Thyroid ultrasound correlation warranted on a short term outpatient basis if not previously performed. This follows ACR consensus guidelines: Managing Incidental Thyroid Nodules Detected on Imaging: White Paper of the ACR Incidental Thyroid Findings Committee. J Am Coll Radiol 2015; 12:143-150. Critical Value/emergent results were called by telephone at the time of interpretation on 07/27/2018 at 2:20 pm to Dr. Varney Biles , who verbally acknowledged these results. Electronically Signed   By: Ilona Sorrel M.D.   On: 07/27/2018 14:29   Mr Brain Wo Contrast  Result Date: 07/27/2018 CLINICAL DATA:  Left hand weakness for 4 days EXAM: MRI HEAD WITHOUT CONTRAST TECHNIQUE: Multiplanar, multiecho pulse  sequences of the brain and surrounding structures were obtained without intravenous contrast. COMPARISON:  Head CT from earlier today FINDINGS: Brain: Patchy restricted diffusion in the left mid to upper cerebellum involving a moderate to large area. There have been remote bilateral smaller cerebellar infarcts. Small remote bilateral occipital and parietal cortically based  infarcts. Mild chronic small vessel ischemic change in the cerebral white matter. Brain volume is normal Focus of chronic blood products along the lateral right frontal lobe, nonspecific in isolation. No masslike finding or hydrocephalus. Vascular: Major flow voids are preserved Skull and upper cervical spine: Negative for marrow lesion Sinuses/Orbits: Negative IMPRESSION: 1. 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 2. Multiple small remote posterior circulation infarcts, suggest vascular imaging. Electronically Signed   By: Monte Fantasia M.D.   On: 07/27/2018 14:56      Scheduled Meds: . aspirin  300 mg Rectal Daily   Or  . aspirin  325 mg Oral Daily  . enoxaparin (LOVENOX) injection  40 mg Subcutaneous Q24H  . gabapentin  100 mg Oral TID  . pantoprazole  40 mg Oral Daily   Continuous Infusions: . sodium chloride 75 mL/hr at 07/27/18 2135     LOS: 1 day      Debbe Odea, MD [facility] Pager: [email or web address] Password [facility] 07/28/2018, 7:46 AM

## 2018-07-28 NOTE — Progress Notes (Signed)
  Echocardiogram 2D Echocardiogram has been performed.  Kelsey Sparks 07/28/2018, 9:16 AM

## 2018-07-28 NOTE — Evaluation (Signed)
Physical Therapy Evaluation Patient Details Name: Kelsey Sparks MRN: [id number] DOB: 04/26/1945 Today's Date: 07/28/2018   History of Present Illness  73 y.o. female with PMH HTN, HLD, trigeminal neuralgia with moderate left cerebellar infarct.  Clinical Impression  Patient seen for therapy assessment.  Mobilizing well with without assist or device, focal deficits at this time limited to LUE. Educated patient on BEFAST signs. No further acute PT needs. Will sign off.    Follow Up Recommendations No PT follow up(defer to OT needs)    Equipment Recommendations  None recommended by PT    Recommendations for Other Services       Precautions / Restrictions Precautions Precautions: Fall      Mobility  Bed Mobility Overal bed mobility: Independent             General bed mobility comments: no physical assist to perform  Transfers Overall transfer level: Independent Equipment used: None             General transfer comment: no difficulty coming to upright  Ambulation/Gait Ambulation/Gait assistance: Supervision Gait Distance (Feet): 240 Feet Assistive device: None Gait Pattern/deviations: WFL(Within Functional Limits) Gait velocity: decreased Gait velocity interpretation: <1.8 ft/sec, indicate of risk for recurrent falls General Gait Details: modestly decreased cadence with narrow BOS, no overt LOB or instability noted  Stairs Stairs: Yes Stairs assistance: Supervision Stair Management: One rail Right Number of Stairs: 3 General stair comments: no difficulty with stair negotation performed  Wheelchair Mobility    Modified Rankin (Stroke Patients Only) Modified Rankin (Stroke Patients Only) Pre-Morbid Rankin Score: No symptoms Modified Rankin: No symptoms     Balance Overall balance assessment: Independent Sitting-balance support: Feet supported Sitting balance-Leahy Scale: Good     Standing balance support: During functional activity Standing  balance-Leahy Scale: Good               High level balance activites: Side stepping;Backward walking;Direction changes;Turns;Sudden stops;Head turns High Level Balance Comments: steady with task performance, no overt LOB noted             Pertinent Vitals/Pain Pain Assessment: No/denies pain    Home Living Family/patient expects to be discharged to:: Private residence Living Arrangements: Alone Available Help at Discharge: Family;Available PRN/intermittently Type of Home: House Home Access: Stairs to enter Entrance Stairs-Rails: None Entrance Stairs-Number of Steps: 1 Home Layout: One level Home Equipment: None      Prior Function Level of Independence: Independent               Hand Dominance   Dominant Hand: Right    Extremity/Trunk Assessment   Upper Extremity Assessment Upper Extremity Assessment: Defer to OT evaluation;LUE deficits/detail    Lower Extremity Assessment Lower Extremity Assessment: Overall WFL for tasks assessed       Communication   Communication: (some slurred speech)  Cognition Arousal/Alertness: Awake/alert Behavior During Therapy: WFL for tasks assessed/performed Overall Cognitive Status: Within Functional Limits for tasks assessed                                        General Comments      Exercises     Assessment/Plan    PT Assessment Patent does not need any further PT services  PT Problem List         PT Treatment Interventions      PT Goals (Current goals can be found in the  Care Plan section)  Acute Rehab PT Goals PT Goal Formulation: All assessment and education complete, DC therapy    Frequency     Barriers to discharge        Co-evaluation               AM-PAC PT "6 Clicks" Mobility  Outcome Measure Help needed turning from your back to your side while in a flat bed without using bedrails?: None Help needed moving from lying on your back to sitting on the side of a flat  bed without using bedrails?: None Help needed moving to and from a bed to a chair (including a wheelchair)?: None Help needed standing up from a chair using your arms (e.g., wheelchair or bedside chair)?: None Help needed to walk in hospital room?: None Help needed climbing 3-5 steps with a railing? : A Little 6 Click Score: 23    End of Session Equipment Utilized During Treatment: Gait belt Activity Tolerance: Patient tolerated treatment well Patient left: in chair;with call bell/phone within reach Nurse Communication: Mobility status PT Visit Diagnosis: Other symptoms and signs involving the nervous system (R29.898)    Time: 9166-0600 PT Time Calculation (min) (ACUTE ONLY): 20 min   Charges:   PT Evaluation $PT Eval Low Complexity: [place], PT DPT  Board Certified Neurologic Specialist Acute Rehabilitation Services Pager [phone] Office [place] 07/28/2018, 7:50 AM

## 2018-07-28 NOTE — Evaluation (Signed)
Occupational Therapy Evaluation Patient Details Name: Kelsey Sparks MRN: [id number] DOB: 1945/10/23 Today's Date: 07/28/2018    History of Present Illness 73 y.o. female with PMH HTN, HLD, trigeminal neuralgia with moderate left cerebellar infarct.   Clinical Impression   PTA patient independent and driving.  Admitted for above and limited by decreased strength and coordination of LUE, decreased activity tolerance. Patient able to complete grooming standing at sink with supervision, UB ADLs with setup assist, LB ADLs with close supervision and transfers with modified independence.  Discussed home safety, reviewed L UE exercises, and recommendations. Patient will benefit from continued OT services while admitted in order to address home safety, L UE coordination and IADLs (med mgmt/cooking), and at this point recommend continued OP OT services at dc in order to optimize return to PLOF and maximize return of L UE.  Will follow.     Follow Up Recommendations  Outpatient OT    Equipment Recommendations  3 in 1 bedside commode    Recommendations for Other Services       Precautions / Restrictions Precautions Precautions: Fall Restrictions Weight Bearing Restrictions: No      Mobility Bed Mobility Overal bed mobility: Independent             General bed mobility comments: OOB upon entry  Transfers Overall transfer level: Modified independent Equipment used: None             General transfer comment: basic transfer without assist    Balance Overall balance assessment: No apparent balance deficits (not formally assessed) Sitting-balance support: Feet supported Sitting balance-Leahy Scale: Good     Standing balance support: During functional activity Standing balance-Leahy Scale: Good               High level balance activites: Side stepping;Backward walking;Direction changes;Turns;Sudden stops;Head turns High Level Balance Comments: steady with task  performance, no overt LOB noted           ADL either performed or assessed with clinical judgement   ADL Overall ADL's : Needs assistance/impaired     Grooming: Supervision/safety;Standing Grooming Details (indicate cue type and reason): educated on using L UE as gross stabilizer Upper Body Bathing: Set up;Sitting   Lower Body Bathing: Set up;Supervison/ safety;Sit to/from stand Lower Body Bathing Details (indicate cue type and reason): reviewed safety, pt reports more comfortable sitting for LB bathing at this time  Upper Body Dressing : Sitting;Supervision/safety   Lower Body Dressing: Supervision/safety;Sit to/from stand Lower Body Dressing Details (indicate cue type and reason): donn/doff socks with supervision Toilet Transfer: Modified Independent;Ambulation Toilet Transfer Details (indicate cue type and reason): simulated to/from recliner          Functional mobility during ADLs: Supervision/safety General ADL Comments: pt limited by decreased functional use of L UE      Vision Baseline Vision/History: Wears glasses Wears Glasses: At all times Patient Visual Report: No change from baseline Vision Assessment?: No apparent visual deficits     Perception     Praxis      Pertinent Vitals/Pain Pain Assessment: No/denies pain     Hand Dominance Right   Extremity/Trunk Assessment Upper Extremity Assessment Upper Extremity Assessment: LUE deficits/detail LUE Deficits / Details: grossly 3/5, dysmetric, able to use a gross stabilizer LUE Sensation: WNL LUE Coordination: decreased fine motor;decreased gross motor   Lower Extremity Assessment Lower Extremity Assessment: Defer to PT evaluation   Cervical / Trunk Assessment Cervical / Trunk Assessment: Normal   Communication Communication Communication: (some slurred speech)  Cognition Arousal/Alertness: Awake/alert Behavior During Therapy: WFL for tasks assessed/performed Overall Cognitive Status: Within  Functional Limits for tasks assessed                                     General Comments       Exercises Exercises: Other exercises Other Exercises Other Exercises: reviewed fine motor tip to tip exercises  Other Exercises: provided with wash clothes and cups; engaged in cup stacking and folding    Shoulder Instructions      Home Living Family/patient expects to be discharged to:: Private residence Living Arrangements: Alone Available Help at Discharge: Family;Available PRN/intermittently Type of Home: House Home Access: Stairs to enter [employer] of Steps: 1 Entrance Stairs-Rails: None Home Layout: One level     Bathroom Shower/Tub: Occupational psychologist: Handicapped height     Home Equipment: None          Prior Functioning/Environment Level of Independence: Independent                 OT Problem List: Decreased strength;Decreased coordination;Decreased knowledge of use of DME or AE;Impaired UE functional use      OT Treatment/Interventions: Self-care/ADL training;Neuromuscular education;DME and/or AE instruction;Therapeutic activities    OT Goals(Current goals can be found in the care plan section) Acute Rehab OT Goals Patient Stated Goal: to get stronger OT Goal Formulation: With patient Time For Goal Achievement: 08/11/18 Potential to Achieve Goals: Good  OT Frequency: Min 2X/week   Barriers to D/C:            Co-evaluation              AM-PAC OT "6 Clicks" Daily Activity     Outcome Measure Help from another person eating meals?: A Little Help from another person taking care of personal grooming?: None Help from another person toileting, which includes using toliet, bedpan, or urinal?: None Help from another person bathing (including washing, rinsing, drying)?: A Little Help from another person to put on and taking off regular upper body clothing?: A Little Help from another person to put on and  taking off regular lower body clothing?: A Little 6 Click Score: 20   End of Session Nurse Communication: Mobility status  Activity Tolerance: Patient tolerated treatment well Patient left: in chair;with call bell/phone within reach  OT Visit Diagnosis: Muscle weakness (generalized) (M62.81);Hemiplegia and hemiparesis Hemiplegia - Right/Left: Left Hemiplegia - dominant/non-dominant: Non-Dominant Hemiplegia - caused by: Cerebral infarction                Time: 0751-0810 OT Time Calculation (min): 19 min Charges:  OT General Charges $OT Visit: 1 Visit OT Evaluation $OT Eval Low Complexity: 1 Low  Delight Stare, OT Acute Rehabilitation Services Pager [phone] Office [phone]   Delight Stare 07/28/2018, 8:21 AM

## 2018-07-28 NOTE — Progress Notes (Deleted)
  Echocardiogram 2D Echocardiogram has been performed.  Kelsey Sparks 07/28/2018, 9:18 AM

## 2018-07-28 NOTE — Progress Notes (Signed)
STROKE TEAM PROGRESS NOTE   SUBJECTIVE (INTERVAL HISTORY) No family is at the bedside.  Patient sitting in chair, no complaints.  Stated that her vertigo has resolved.  Still has left arm mild ataxia.  OBJECTIVE Vitals:   07/27/18 2024 07/27/18 2335 07/28/18 0231 07/28/18 0420  BP: 118/75 126/84 101/68 124/70  Pulse: 76 76 80 83  Resp: 18 18  17   Temp: 98.2 F (36.8 C) 98.2 F (36.8 C) 97.8 F (36.6 C) 98.1 F (36.7 C)  TempSrc: Oral Oral Oral Oral  SpO2: 98% 99% 99% 100%  Weight:      Height:        CBC:  Recent Labs  Lab 07/27/18 1343 07/27/18 1543  WBC 7.1 7.5  NEUTROABS 4.5  --   HGB 11.4* 11.3*  HCT 35.1* 33.6*  MCV 93.1 92.6  PLT 176 263    Basic Metabolic Panel:  Recent Labs  Lab 07/27/18 1343 07/27/18 1543  NA 140  --   K 3.8  --   CL 107  --   CO2 25  --   GLUCOSE 94  --   BUN 19  --   CREATININE 1.38* 1.27*  CALCIUM 9.3  --     Lipid Panel:     Component Value Date/Time   CHOL 206 (H) 12/18/2017 0911   TRIG 270.0 (H) 12/18/2017 0911   HDL 38.50 (L) 12/18/2017 0911   CHOLHDL 5 12/18/2017 0911   VLDL 54.0 (H) 12/18/2017 0911   LDLCALC 104 (H) 12/13/2016 0959   HgbA1c:  Lab Results  Component Value Date   HGBA1C 5.7 05/24/2006   Urine Drug Screen:     Component Value Date/Time   LABOPIA NEG 02/24/2015 1528   COCAINSCRNUR NEG 02/24/2015 1528   LABBENZ NEG 02/24/2015 1528   AMPHETMU NEG 02/24/2015 1528   THCU NEG 02/24/2015 1528   LABBARB NEG 02/24/2015 1528    Alcohol Level No results found for: ETH  IMAGING  Ct Head Wo Contrast 07/27/2018 IMPRESSION:  1. Rounded focus of cortical hypodensity in the anterior left cerebellar hemisphere with associated mild local sulcal effacement, new since 2012 head CT, most compatible with acute/early subacute left cerebellar infarct.  2. Small focus of encephalomalacia in the posteromedial right cerebellar hemisphere, favor remote infarct.  3. No acute intracranial hemorrhage.  4. Mild chronic  small vessel ischemic changes in the cerebral white matter.  5. 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 ACDF C6-7 with no evidence of hardware complication.  6. Mild-to-moderate multilevel degenerative foraminal stenosis in the lower cervical spine as detailed.  7. Dominant 1.9 cm left thyroid lobe nodule. Thyroid ultrasound correlation warranted on a short term outpatient basis if not previously performed.   Ct Cervical Spine Wo Contrast 07/27/2018 IMPRESSION:    Moderate-to-marked multilevel degenerative disc disease in the mid to lower cervical spine. ACDF C6-7 with no evidence of hardware complication.   Mild-to-moderate multilevel degenerative foraminal stenosis in the lower cervical spine as detailed.   Ct Angio Head W Or Wo Contrast  Result Date: 07/28/2018 CLINICAL DATA:  Stroke workup EXAM: CT ANGIOGRAPHY HEAD AND NECK TECHNIQUE: Multidetector CT imaging of the head and neck was performed using the standard protocol during bolus administration of intravenous contrast. Multiplanar CT image reconstructions and MIPs were obtained to evaluate the vascular anatomy. Carotid stenosis measurements (when applicable) are obtained utilizing NASCET criteria, using the distal internal carotid diameter as the denominator. CONTRAST:  62mL OMNIPAQUE IOHEXOL 350 MG/ML  SOLN COMPARISON:  Head CT and brain MRI from yesterday FINDINGS: CTA NECK FINDINGS Aortic arch: Unremarkable Right carotid system: Mild atherosclerotic plaque at the bulb without flow limiting stenosis or ulceration. Left carotid system: Mild atherosclerotic plaque at the bulb without stenosis or ulceration. Vertebral arteries: No proximal subclavian stenosis or ulceration. Essentially codominant vertebral arteries with mild tortuosity but no beading, dissection, or stenosis. Skeleton: C6-7 ACDF with solid arthrodesis. Other neck: Left thyroid nodule/cyst measuring 18 mm, considered incidental.  Upper chest: Biapical emphysema. Review of the MIP images confirms the above findings CTA HEAD FINDINGS Anterior circulation: No branch occlusion, flow limiting stenosis, or beading. There is an inferiorly projecting left supraclinoid ICA aneurysm measuring 6 mm. The sac is smooth. Posterior circulation: The vertebral and basilar arteries are smooth and widely patent. No branch occlusion or flow limiting stenosis. Negative for aneurysm Venous sinuses: Strong dominance of the right transverse sigmoid dural venous sinus. No thrombosis. Anatomic variants: Negative Delayed phase: Known acute left cerebellar infarct. No evidence of interval infarct since MRI yesterday. Review of the MIP images confirms the above findings IMPRESSION: 1. Mild atherosclerosis without flow limiting stenosis or visible embolic source. 2. 6 mm left supraclinoid ICA aneurysm. Electronically Signed   By: Monte Fantasia M.D.   On: 07/28/2018 10:10   Ct Angio Neck W Or Wo Contrast  Result Date: 07/28/2018 CLINICAL DATA:  Stroke workup EXAM: CT ANGIOGRAPHY HEAD AND NECK TECHNIQUE: Multidetector CT imaging of the head and neck was performed using the standard protocol during bolus administration of intravenous contrast. Multiplanar CT image reconstructions and MIPs were obtained to evaluate the vascular anatomy. Carotid stenosis measurements (when applicable) are obtained utilizing NASCET criteria, using the distal internal carotid diameter as the denominator. CONTRAST:  87mL OMNIPAQUE IOHEXOL 350 MG/ML SOLN COMPARISON:  Head CT and brain MRI from yesterday FINDINGS: CTA NECK FINDINGS Aortic arch: Unremarkable Right carotid system: Mild atherosclerotic plaque at the bulb without flow limiting stenosis or ulceration. Left carotid system: Mild atherosclerotic plaque at the bulb without stenosis or ulceration. Vertebral arteries: No proximal subclavian stenosis or ulceration. Essentially codominant vertebral arteries with mild tortuosity but no  beading, dissection, or stenosis. Skeleton: C6-7 ACDF with solid arthrodesis. Other neck: Left thyroid nodule/cyst measuring 18 mm, considered incidental. Upper chest: Biapical emphysema. Review of the MIP images confirms the above findings CTA HEAD FINDINGS Anterior circulation: No branch occlusion, flow limiting stenosis, or beading. There is an inferiorly projecting left supraclinoid ICA aneurysm measuring 6 mm. The sac is smooth. Posterior circulation: The vertebral and basilar arteries are smooth and widely patent. No branch occlusion or flow limiting stenosis. Negative for aneurysm Venous sinuses: Strong dominance of the right transverse sigmoid dural venous sinus. No thrombosis. Anatomic variants: Negative Delayed phase: Known acute left cerebellar infarct. No evidence of interval infarct since MRI yesterday. Review of the MIP images confirms the above findings IMPRESSION: 1. Mild atherosclerosis without flow limiting stenosis or visible embolic source. 2. 6 mm left supraclinoid ICA aneurysm. Electronically Signed   By: Monte Fantasia M.D.   On: 07/28/2018 10:10   Vas [place] Lower Extremity Venous (dvt)  Result Date: 07/28/2018  Lower Venous Study Indications: Stroke.  Comparison Study: No prior study on file for comparison Performing Technologist: Sharion Dove RVS  Examination Guidelines: A complete evaluation includes B-mode imaging, spectral Doppler, color Doppler, and power Doppler as needed of all accessible portions of each vessel. Bilateral testing is considered an integral part of a complete examination. Limited examinations for reoccurring indications  may be performed as noted.  +---------+---------------+---------+-----------+----------+-------+ RIGHT    CompressibilityPhasicitySpontaneityPropertiesSummary +---------+---------------+---------+-----------+----------+-------+ CFV      Full           Yes      Yes                           +---------+---------------+---------+-----------+----------+-------+ SFJ      Full                                                 +---------+---------------+---------+-----------+----------+-------+ FV Prox  Full                                                 +---------+---------------+---------+-----------+----------+-------+ FV Mid   Full                                                 +---------+---------------+---------+-----------+----------+-------+ FV DistalFull                                                 +---------+---------------+---------+-----------+----------+-------+ PFV      Full                                                 +---------+---------------+---------+-----------+----------+-------+ POP      Full           Yes      Yes                          +---------+---------------+---------+-----------+----------+-------+ PTV      Full                                                 +---------+---------------+---------+-----------+----------+-------+ PERO     Full                                                 +---------+---------------+---------+-----------+----------+-------+   +---------+---------------+---------+-----------+----------+-------+ LEFT     CompressibilityPhasicitySpontaneityPropertiesSummary +---------+---------------+---------+-----------+----------+-------+ CFV      Full           Yes      Yes                          +---------+---------------+---------+-----------+----------+-------+ SFJ      Full                                                 +---------+---------------+---------+-----------+----------+-------+  FV Prox  Full                                                 +---------+---------------+---------+-----------+----------+-------+ FV Mid   Full                                                 +---------+---------------+---------+-----------+----------+-------+ FV DistalFull                                                  +---------+---------------+---------+-----------+----------+-------+ PFV      Full                                                 +---------+---------------+---------+-----------+----------+-------+ POP      Full           Yes      Yes                          +---------+---------------+---------+-----------+----------+-------+ PTV      Full                                                 +---------+---------------+---------+-----------+----------+-------+ PERO     Full                                                 +---------+---------------+---------+-----------+----------+-------+     *See table(s) above for measurements and observations.    Preliminary     Mr Brain Wo Contrast 07/27/2018 IMPRESSION:  1. 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  2. Multiple small remote posterior circulation infarcts, suggest vascular imaging.   EKG - SR rate 78 BPM. (See cardiology reading for complete details)    PHYSICAL EXAM  Temp:  [97.6 F (36.4 C)-98.7 F (37.1 C)] 97.6 F (36.4 C) (05/03 1149) Pulse Rate:  [74-86] 86 (05/03 1149) Resp:  [15-19] 18 (05/03 1149) BP: (101-147)/(68-87) 131/82 (05/03 1149) SpO2:  [98 %-100 %] 100 % (05/03 1149) Weight:  [72 kg] 72 kg (05/02 1758)  General - Well nourished, well developed, in no apparent distress.  Ophthalmologic - fundi not visualized due to noncooperation.  Cardiovascular - Regular rate and rhythm.  Mental Status -  Level of arousal and orientation to time, place, and person were intact. Language including expression, naming, repetition, comprehension was assessed and found intact. Fund of Knowledge was assessed and was intact.  Cranial Nerves II - XII - II - Visual field intact OU. III, IV, VI - Extraocular movements intact. V - Facial sensation intact bilaterally. VII - Facial movement intact bilaterally. VIII - Hearing & vestibular intact  bilaterally. X - Palate elevates symmetrically. XI -  Chin turning & shoulder shrug intact bilaterally. XII - Tongue protrusion intact.  Motor Strength - The patient's strength was normal in all extremities and pronator drift was absent.  Bulk was normal and fasciculations were absent.   Motor Tone - Muscle tone was assessed at the neck and appendages and was normal.  Reflexes - The patient's reflexes were symmetrical in all extremities and she had no pathological reflexes.  Sensory - Light touch, temperature/pinprick were assessed and were symmetrical.    Coordination - The patient had normal movements in the right and feet with no ataxia or dysmetria.  However, she has left finger-to-nose mild ataxia. Tremor was absent.  Gait and Station - deferred.   ASSESSMENT/PLAN Ms. TAYLEN WENDLAND is a 73 y.o. female with history of HTN, HLD, migraines, ASPVD, vertigo and trigeminal neuralgia presented to [facility] for C/o left arm weakness. She did not receive IV t-PA due to late presentation (>4.5 hours from time of onset).  Stroke: left SCA territory scattered infarcts concerning for embolic patten given chronic asymptomatic bilateral infarcts on MRI  Resultant left arm mild ataxia  CT head - Rounded focus of cortical hypodensity in the anterior left cerebellar hemisphere with associated mild local sulcal effacement, new since 2012 head CT, most compatible with acute/early subacute left cerebellar infarct. Small focus of encephalomalacia in the posteromedial right cerebellar hemisphere, favor remote infarct.   MRI head - Moderate acute infarction of the left cerebellum. Multiple small remote posterior circulation infarcts  CTA H&N unremarkable except 6 mm left supraclinoid ICA aneurysm  LE Venous Dopplers no DVT  2D Echo EF more than 65%, no PFO  Recommend loop recorder to rule out A. fib  LDL - pending  HgbA1c - pending  VTE prophylaxis - Lovenox  aspirin 81 mg daily prior to  admission, now on aspirin 81 and Plavix 75.  Continue DAPT for 3 weeks and then Plavix alone.  Patient counseled to be compliant with her antithrombotic medications  Ongoing aggressive stroke risk factor management  Therapy recommendations:  Outpt OT  Disposition:  Pending  Asymptomatic chronic infarcts  CT and MRI showed old bilateral cerebellum, right CR, left frontal and left parietal infarcts  Concerning for cardioembolic source.  Recommend loop recorder to rule out A. fib as outpatient.  6 mm left supraclinoid ICA aneurysm  Need interventional neuroradiology follow-up  Will refer to Dr. Estanislado Pandy for outpatient follow-up.  Hypertension  Blood pressure stable . Permissive hypertension (OK if < 220/120) but gradually normalize in 3-5 days . Long-term BP goal normotensive  Hyperlipidemia  Lipid lowering medication PTA: Zocor 40 mg daily  LDL pending, goal < 70  Resumed Zocor  Continue statin at discharge  Allergic to Lipitor (hives)  Other Stroke Risk Factors  Advanced age  Former cigarette smoker - quit  Migraines  PVD  Other Active Problems  Elevated creatinine - 1.27  Dominant 1.9 cm left thyroid lobe nodule - outpatient follow-up   Hospital day # 1  Rosalin Hawking, MD PhD Stroke Neurology 07/28/2018 2:42 PM  I spent  35 minutes in total face-to-face time with the patient, more than 50% of which was spent in counseling and coordination of care, reviewing test results, images and medication, and discussing the diagnosis of cerebellum stroke, recurrent stroke, unruptured cerebral aneurysm, treatment plan and potential prognosis. This patient's care requiresreview of multiple databases, neurological assessment, discussion with family, other specialists and medical decision making of high complexity.   To contact Stroke Continuity provider, please refer  to [email or web address] After hours, contact General Neurology

## 2018-07-29 ENCOUNTER — Encounter (HOSPITAL_COMMUNITY)
Admission: EM | Disposition: A | Discharge status: Home Health | Payer: Self-pay | Source: Home / Self Care | Attending: Internal Medicine

## 2018-07-29 ENCOUNTER — Telehealth: Payer: Self-pay | Admitting: *Deleted

## 2018-07-29 DIAGNOSIS — I6389 Other cerebral infarction: Secondary | ICD-10-CM

## 2018-07-29 DIAGNOSIS — G500 Trigeminal neuralgia: Secondary | ICD-10-CM

## 2018-07-29 HISTORY — PX: LOOP RECORDER INSERTION: EP1214

## 2018-07-29 LAB — GLUCOSE, CAPILLARY
Glucose-Capillary: 110 mg/dL — ABNORMAL HIGH (ref 70–99)
Glucose-Capillary: 132 mg/dL — ABNORMAL HIGH (ref 70–99)

## 2018-07-29 LAB — CBC
HCT: 33.3 % — ABNORMAL LOW (ref 36.0–46.0)
Hemoglobin: 11.4 g/dL — ABNORMAL LOW (ref 12.0–15.0)
MCH: 29.8 pg (ref 26.0–34.0)
MCHC: 34.2 g/dL (ref 30.0–36.0)
MCV: 87.2 fL (ref 80.0–100.0)
Platelets: 192 10*3/uL (ref 150–400)
RBC: 3.82 MIL/uL — ABNORMAL LOW (ref 3.87–5.11)
RDW: 13.1 % (ref 11.5–15.5)
WBC: 7.2 10*3/uL (ref 4.0–10.5)
nRBC: 0 % (ref 0.0–0.2)

## 2018-07-29 LAB — BASIC METABOLIC PANEL
Anion gap: 13 (ref 5–15)
BUN: 17 mg/dL (ref 8–23)
CO2: 22 mmol/L (ref 22–32)
Calcium: 9.2 mg/dL (ref 8.9–10.3)
Chloride: 108 mmol/L (ref 98–111)
Creatinine, Ser: 1.31 mg/dL — ABNORMAL HIGH (ref 0.44–1.00)
GFR calc Af Amer: 48 mL/min — ABNORMAL LOW (ref >60)
GFR calc non Af Amer: 41 mL/min — ABNORMAL LOW (ref >60)
Glucose, Bld: 119 mg/dL — ABNORMAL HIGH (ref 70–99)
Potassium: 3.5 mmol/L (ref 3.5–5.1)
Sodium: 143 mmol/L (ref 135–145)

## 2018-07-29 LAB — MAGNESIUM: Magnesium: 1.7 mg/dL (ref 1.7–2.4)

## 2018-07-29 SURGERY — LOOP RECORDER INSERTION

## 2018-07-29 MED ORDER — ONDANSETRON HCL 4 MG/2ML IJ SOLN: 4.0000 mg | Freq: Four times a day (QID) | INTRAMUSCULAR | Status: DC | PRN

## 2018-07-29 MED ORDER — CLOPIDOGREL BISULFATE 75 MG PO TABS: 75.0000 mg | ORAL_TABLET | Freq: Every day | ORAL | 0 refills | Status: DC

## 2018-07-29 MED ORDER — PANTOPRAZOLE SODIUM 40 MG PO TBEC: 40.0000 mg | DELAYED_RELEASE_TABLET | Freq: Every day | ORAL | 0 refills | Status: DC

## 2018-07-29 MED ORDER — UNABLE TO FIND: 0 refills | Status: AC

## 2018-07-29 MED ORDER — ACETAMINOPHEN 325 MG PO TABS: 325.0000 mg | ORAL_TABLET | ORAL | Status: DC | PRN

## 2018-07-29 MED ORDER — LIDOCAINE-EPINEPHRINE 1 %-1:100000 IJ SOLN
INTRAMUSCULAR | Status: DC | PRN
  Administered 2018-07-29: 20 mL via INTRADERMAL

## 2018-07-29 MED FILL — CLOPIDOGREL 75 MG TABLET: 75 | 30 days supply | Qty: 30 | Fill #0

## 2018-07-29 MED FILL — PANTOPRAZOLE SOD DR 40 MG T: 40 | 30 days supply | Qty: 30 | Fill #0

## 2018-07-29 SURGICAL SUPPLY — 2 items
LOOP REVEAL LINQSYS (Prosthesis & Implant Heart) ×1 IMPLANT
PACK LOOP INSERTION (CUSTOM PROCEDURE TRAY) ×2 IMPLANT

## 2018-07-29 NOTE — Progress Notes (Signed)
STROKE TEAM PROGRESS NOTE   SUBJECTIVE (INTERVAL HISTORY) Pt lying in bed, still has left arm ataxia, no change from yesterday. Pending loop recorder today.   OBJECTIVE Vitals:   07/28/18 2350 07/29/18 0338 07/29/18 0740 07/29/18 1300  BP: (!) 144/81 133/83 116/79 122/87  Pulse: 91 98 (!) 102 (!) 109  Resp: 18 18 18 18   Temp: 98 F (36.7 C) 97.9 F (36.6 C) 98.4 F (36.9 C) 98 F (36.7 C)  TempSrc: Oral Oral Oral Axillary  SpO2: 99% 98% 100% 100%  Weight:      Height:        CBC:  Recent Labs  Lab 07/27/18 1343 07/27/18 1543 07/29/18 0531  WBC 7.1 7.5 7.2  NEUTROABS 4.5  --   --   HGB 11.4* 11.3* 11.4*  HCT 35.1* 33.6* 33.3*  MCV 93.1 92.6 87.2  PLT 176 171 009    Basic Metabolic Panel:  Recent Labs  Lab 07/27/18 1343 07/27/18 1543 07/29/18 0531  NA 140  --  143  K 3.8  --  3.5  CL 107  --  108  CO2 25  --  22  GLUCOSE 94  --  119*  BUN 19  --  17  CREATININE 1.38* 1.27* 1.31*  CALCIUM 9.3  --  9.2  MG  --   --  1.7    Lipid Panel:     Component Value Date/Time   CHOL 206 (H) 12/18/2017 0911   TRIG 270.0 (H) 12/18/2017 0911   HDL 38.50 (L) 12/18/2017 0911   CHOLHDL 5 12/18/2017 0911   VLDL 54.0 (H) 12/18/2017 0911   LDLCALC 104 (H) 12/13/2016 0959   HgbA1c:  Lab Results  Component Value Date   HGBA1C 5.7 05/24/2006   Urine Drug Screen:     Component Value Date/Time   LABOPIA NEG 02/24/2015 1528   COCAINSCRNUR NEG 02/24/2015 1528   LABBENZ NEG 02/24/2015 1528   AMPHETMU NEG 02/24/2015 1528   THCU NEG 02/24/2015 1528   LABBARB NEG 02/24/2015 1528    Alcohol Level No results found for: ETH  IMAGING Ct Head Wo Contrast 07/27/2018 IMPRESSION:  1. Rounded focus of cortical hypodensity in the anterior left cerebellar hemisphere with associated mild local sulcal effacement, new since 2012 head CT, most compatible with acute/early subacute left cerebellar infarct.  2. Small focus of encephalomalacia in the posteromedial right cerebellar  hemisphere, favor remote infarct.  3. No acute intracranial hemorrhage.  4. Mild chronic small vessel ischemic changes in the cerebral white matter.  5. 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 ACDF C6-7 with no evidence of hardware complication.  6. Mild-to-moderate multilevel degenerative foraminal stenosis in the lower cervical spine as detailed.  7. Dominant 1.9 cm left thyroid lobe nodule. Thyroid ultrasound correlation warranted on a short term outpatient basis if not previously performed.   Ct Cervical Spine Wo Contrast 07/27/2018 IMPRESSION:    Moderate-to-marked multilevel degenerative disc disease in the mid to lower cervical spine. ACDF C6-7 with no evidence of hardware complication.   Mild-to-moderate multilevel degenerative foraminal stenosis in the lower cervical spine as detailed.   Ct Angio Head W Or Wo Contrast Ct Angio Neck W Or Wo Contrast Result Date: 07/28/2018 1. Mild atherosclerosis without flow limiting stenosis or visible embolic source. 2. 6 mm left supraclinoid ICA aneurysm. Electronically Signed   By: Monte Fantasia M.D.   On: 07/28/2018 10:10   Vas [place] Lower Extremity Venous (dvt)  Result Date:  07/28/2018  Lower Venous Study Indications: Stroke.  Comparison Study: No prior study on file for comparison Performing Technologist: Sharion Dove RVS  Examination Guidelines: A complete evaluation includes B-mode imaging, spectral Doppler, color Doppler, and power Doppler as needed of all accessible portions of each vessel. Bilateral testing is considered an integral part of a complete examination. Limited examinations for reoccurring indications may be performed as noted.  +---------+---------------+---------+-----------+----------+-------+ RIGHT    CompressibilityPhasicitySpontaneityPropertiesSummary +---------+---------------+---------+-----------+----------+-------+ CFV      Full           Yes      Yes                           +---------+---------------+---------+-----------+----------+-------+ SFJ      Full                                                 +---------+---------------+---------+-----------+----------+-------+ FV Prox  Full                                                 +---------+---------------+---------+-----------+----------+-------+ FV Mid   Full                                                 +---------+---------------+---------+-----------+----------+-------+ FV DistalFull                                                 +---------+---------------+---------+-----------+----------+-------+ PFV      Full                                                 +---------+---------------+---------+-----------+----------+-------+ POP      Full           Yes      Yes                          +---------+---------------+---------+-----------+----------+-------+ PTV      Full                                                 +---------+---------------+---------+-----------+----------+-------+ PERO     Full                                                 +---------+---------------+---------+-----------+----------+-------+   +---------+---------------+---------+-----------+----------+-------+ LEFT     CompressibilityPhasicitySpontaneityPropertiesSummary +---------+---------------+---------+-----------+----------+-------+ CFV      Full           Yes      Yes                          +---------+---------------+---------+-----------+----------+-------+  SFJ      Full                                                 +---------+---------------+---------+-----------+----------+-------+ FV Prox  Full                                                 +---------+---------------+---------+-----------+----------+-------+ FV Mid   Full                                                  +---------+---------------+---------+-----------+----------+-------+ FV DistalFull                                                 +---------+---------------+---------+-----------+----------+-------+ PFV      Full                                                 +---------+---------------+---------+-----------+----------+-------+ POP      Full           Yes      Yes                          +---------+---------------+---------+-----------+----------+-------+ PTV      Full                                                 +---------+---------------+---------+-----------+----------+-------+ PERO     Full                                                 +---------+---------------+---------+-----------+----------+-------+     *See table(s) above for measurements and observations.    Preliminary    Mr Brain Wo Contrast 07/27/2018 IMPRESSION:  1. 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  2. Multiple small remote posterior circulation infarcts, suggest vascular imaging.   EKG - SR rate 78 BPM. (See cardiology reading for complete details)   PHYSICAL EXAM  Temp:  [97.8 F (36.6 C)-98.4 F (36.9 C)] 98 F (36.7 C) (05/04 1300) Pulse Rate:  [89-109] 109 (05/04 1300) Resp:  [16-18] 18 (05/04 1300) BP: (116-147)/(79-91) 122/87 (05/04 1300) SpO2:  [98 %-100 %] 100 % (05/04 1300)  General - Well nourished, well developed, in no apparent distress.  Ophthalmologic - fundi not visualized due to noncooperation.  Cardiovascular - Regular rate and rhythm.  Mental Status -  Level of arousal and orientation to time, place, and person were intact. Language including expression, naming, repetition, comprehension was assessed and found intact. Fund of Knowledge was assessed and was intact.  Cranial Nerves II - XII - II - Visual field intact OU. III, IV, VI - Extraocular movements intact. V - Facial sensation intact bilaterally. VII - Facial movement intact  bilaterally. VIII - Hearing & vestibular intact bilaterally. X - Palate elevates symmetrically. XI - Chin turning & shoulder shrug intact bilaterally. XII - Tongue protrusion intact.  Motor Strength - The patient's strength was normal in all extremities and pronator drift was absent.  Bulk was normal and fasciculations were absent.   Motor Tone - Muscle tone was assessed at the neck and appendages and was normal.  Reflexes - The patient's reflexes were symmetrical in all extremities and she had no pathological reflexes.  Sensory - Light touch, temperature/pinprick were assessed and were symmetrical.    Coordination - The patient had normal movements in the right and feet with no ataxia or dysmetria.  However, she still has left finger-to-nose mild ataxia. Tremor was absent.  Gait and Station - deferred.   ASSESSMENT/PLAN Ms. CANDI PROFIT is a 73 y.o. female with history of HTN, HLD, migraines, ASPVD, vertigo and trigeminal neuralgia presented to [facility] for C/o left arm weakness. She did not receive IV t-PA due to late presentation (>4.5 hours from time of onset).  Stroke: left SCA territory scattered infarcts concerning for embolic patten given chronic asymptomatic bilateral infarcts on MRI  CT head - Rounded focus of cortical hypodensity in the anterior left cerebellar hemisphere with associated mild local sulcal effacement, new since 2012 head CT, most compatible with acute/early subacute left cerebellar infarct. Small focus of encephalomalacia in the posteromedial right cerebellar hemisphere, favor remote infarct.   MRI head - Moderate acute infarction of the left cerebellum. Multiple small remote posterior circulation infarcts  CTA H&N unremarkable except 6 mm left supraclinoid ICA aneurysm  LE Venous Dopplers no DVT  2D Echo EF more than 65%, no PFO  loop recorder to rule out A. Fib placed 5.4.2020  LDL - pending  HgbA1c - pending  VTE prophylaxis - Lovenox  aspirin 81  mg daily prior to admission, now on aspirin 81 and Plavix 75.  Continue DAPT for 3 weeks and then Plavix alone.  Therapy recommendations:  Outpt OT  Disposition:  Return home [place] for d/c from stroke standpoint Follow-up Stroke Clinic at [facility] in 4 weeks. Office will call with appointment date and time. Order placed.  Asymptomatic chronic infarcts  CT and MRI showed old bilateral cerebellum, right CR, left frontal and left parietal infarcts  Loop recorder to rule out A. fib as outpatient placed 5.4.2020  6 mm left supraclinoid ICA aneurysm  Need interventional neuroradiology follow-up  Will refer to Dr. Estanislado Pandy for outpatient follow-up. He is aware  Hypertension  Blood pressure stable . Permissive hypertension (OK if < 220/120) but gradually normalize in 3-5 days . Long-term BP goal normotensive  Hyperlipidemia  Lipid lowering medication PTA: Zocor 40 mg daily  LDL pending, goal < 70  Resumed Zocor  Continue statin at discharge  Allergic to Lipitor (hives)  Other Stroke Risk Factors  Advanced age  Former cigarette smoker - quit  Migraines  PVD  Other Active Problems  Elevated creatinine - 1.27  Dominant 1.9 cm left thyroid lobe nodule - outpatient follow-up  Hospital day # 2  Neurology will sign off. Please call with questions. Pt will follow up with stroke clinic NP at [facility] in about 4 weeks. Thanks for the consult.   Rosalin Hawking, MD PhD Stroke Neurology 07/29/2018 2:11 PM  To contact Stroke Continuity provider, please refer to [email or web address] After hours, contact General Neurology

## 2018-07-29 NOTE — TOC Transition Note (Signed)
Transition of Care [facility]) - CM/SW Discharge Note   Patient Details  Name: Kelsey Sparks MRN: [id number] Date of Birth: [birth year]  Transition [facility] CM/SW Contact:  Pollie Friar, RN Phone Number: 07/29/2018, 2:03 PM   Clinical Narrative:    Pt discharging home. She has transportation. 3 in 1 delivered to the room.   Final next level of care: Home w Home Health Services Barriers to Discharge: Continued Medical Work up   Patient Goals and CMS Choice   CMS Medicare.gov Compare Post Acute Care list provided to:: Patient Choice offered to / list presented to : Patient  Discharge Placement                       Discharge Plan and Services   Discharge Planning Services: CM Consult Post Acute Care Choice: Home Health, Durable Medical Equipment          DME Arranged: 3-N-1 DME Agency: [employer] Date DME Agency Contacted: 07/29/18 Time DME Agency Contacted: 1059 Representative spoke with at DME Agency: [place]: RN, OT, Speech Therapy [place] Agency: [place] ([place]) Date [place]: 07/29/18 Time [place]: 1100 Representative spoke with at [place]: [place] ([place]) Interventions     Readmission Risk Interventions No flowsheet data found.

## 2018-07-29 NOTE — Discharge Summary (Signed)
Physician Discharge Summary  Kelsey Sparks TGY:[id number] DOB: October 22, 1945 DOA: 07/27/2018  PCP: Dorothyann Peng, NP  Admit date: 07/27/2018 Discharge date: 07/29/2018  Admitted From:home Disposition:  home   Recommendations for Outpatient Follow-up:  1. Needs outpt f/u of aneurysm and thyroid nodule 2. Aciphex changed to Protonix  Home Health:  OT and SLP  Equipment/Devices:  none    Discharge Condition:  stable   CODE STATUS:  Full code  Consultations:  Neuro    Discharge Diagnoses:  Principal Problem:   Acute CVA (cerebrovascular accident) [facility]) Active Problems:   Hyperlipidemia   Essential hypertension   Trigeminal neuralgia   Osteoarthritis of both hands       Brief Summary: Kelsey Sparks is a 73 y.o.femalewith medical history significant ofwith past medical history of essential hypertension, hyperlipidemia, GERD, allergic rhinitis, trigeminal neuralgia came to the hospital for evaluation of vertigo followed by left-sided arm weakness for the past 4 days. He presented to [facility] and was transferred to [facility].  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   Multiple small remote posterior circulation infarcts, suggest vascular imaging.  Hospital Course:  Acute CVA (cerebrovascular accident) with left arm ataxia - suspected to be embolic-  left SCA territory scattered infarcts  - chronic infarcts also noted on imaging - CTA head/neck > 6 mm left supraclinoid ICA aneurysm. - LE venous duplex> no DVT - Stroke team recommending: - ASA and Plavix x 3 mo followed by only Plavix - loop recorder (placed today) - cont Statin   Active Problems:   Hyperlipidemia - on Simvastatin at home   6 mm left supraclinoid ICA aneurysm. - has been referred by Neuro to Dr Estanislado Pandy as output  Thyroid nodule - needs outpt follow up - TSH normal at 1.289  CKD 3 - outpt follow up    Essential hypertension - on Atenolol and Maxzide     Trigeminal  neuralgia -on Neurontin    Osteoarthritis of both hands - on Mobic  GERD - on Aciphex at home- as this interacts with Plavix and she is now on Plavix, this will need to be changed to Protonix    Discharge Exam: Vitals:   07/29/18 0740 07/29/18 1300  BP: 116/79 122/87  Pulse: (!) 102 (!) 109  Resp: 18 18  Temp: 98.4 F (36.9 C) 98 F (36.7 C)  SpO2: 100% 100%   Vitals:   07/28/18 2350 07/29/18 0338 07/29/18 0740 07/29/18 1300  BP: (!) 144/81 133/83 116/79 122/87  Pulse: 91 98 (!) 102 (!) 109  Resp: 18 18 18 18   Temp: 98 F (36.7 C) 97.9 F (36.6 C) 98.4 F (36.9 C) 98 F (36.7 C)  TempSrc: Oral Oral Oral Axillary  SpO2: 99% 98% 100% 100%  Weight:      Height:        General: Pt is alert, awake, not in acute distress Cardiovascular: RRR, S1/S2 +, no rubs, no gallops Respiratory: CTA bilaterally, no wheezing, no rhonchi Abdominal: Soft, NT, ND, bowel sounds + Extremities: no edema, no cyanosis   Discharge Instructions  Discharge Instructions    Diet - low sodium heart healthy   Complete by:  As directed    Increase activity slowly   Complete by:  As directed      Allergies as of 07/29/2018      Reactions   Atorvastatin Hives      Medication List    STOP taking these medications   meloxicam 15 MG tablet Commonly  known as:  MOBIC   RABEprazole 20 MG tablet Commonly known as:  ACIPHEX Replaced by:  pantoprazole 40 MG tablet     TAKE these medications   aspirin EC 81 MG tablet Take 81 mg by mouth daily.   atenolol 50 MG tablet Commonly known as:  TENORMIN TAKE 1 TABLET(50 MG) BY MOUTH EVERY MORNING What changed:    how much to take  how to take this  when to take this  additional instructions   baclofen 10 MG tablet Commonly known as:  LIORESAL Take 10 mg by mouth 2 (two) times daily as needed (headache). Max 2 treatments per week   cetirizine 10 MG tablet Commonly known as:  ZYRTEC Take 10 mg by mouth daily.   clopidogrel 75 MG  tablet Commonly known as:  PLAVIX Take 1 tablet (75 mg total) by mouth daily.   conjugated estrogens vaginal cream Commonly known as:  PREMARIN 0.5 applicatorful vaginally twice weekly. What changed:    how much to take  how to take this  when to take this  additional instructions   fluticasone 50 MCG/ACT nasal spray Commonly known as:  FLONASE instill 2 sprays into each nostril once daily What changed:    how much to take  how to take this  when to take this  additional instructions   gabapentin 100 MG capsule Commonly known as:  NEURONTIN Take 1 capsule (100 mg total) by mouth 3 (three) times daily. What changed:    how much to take  when to take this   hydrocortisone 25 MG suppository Commonly known as:  ANUSOL-HC Place 1 suppository (25 mg total) rectally 2 (two) times daily as needed for hemorrhoids or itching.   meclizine 25 MG tablet Commonly known as:  ANTIVERT TAKE 1/2 TO 1 TABLET EVERY 6 HOURS IF NEEDED What changed:    how much to take  how to take this  when to take this  reasons to take this  additional instructions   pantoprazole 40 MG tablet Commonly known as:  PROTONIX Take 1 tablet (40 mg total) by mouth daily. Start taking on:  Jul 30, 2018 Replaces:  RABEprazole 20 MG tablet   Polyethyl Glycol-Propyl Glycol 0.4-0.3 % Soln Commonly known as:  Systane Apply 2 drops to eye 2 (two) times daily. What changed:    how to take this  when to take this   simvastatin 40 MG tablet Commonly known as:  ZOCOR Take 1 tablet (40 mg total) by mouth daily.   traMADol 50 MG tablet Commonly known as:  ULTRAM Take 1-2 tabletsby mouth every 8 hours for pain What changed:    how much to take  how to take this  when to take this  additional instructions   triamterene-hydrochlorothiazide 37.5-25 MG tablet Commonly known as:  MAXZIDE-25 Take 1 tablet by mouth every morning. What changed:  when to take this   [place]  patient occupational therapy   Voltaren 1 % Gel Generic drug:  diclofenac sodium Apply 2 g topically 4 (four) times daily. What changed:    when to take this  reasons to take this   zonisamide 50 MG capsule Commonly known as:  ZONEGRAN Take 50 mg by mouth at bedtime. Increase as directed; for Migraines            Durable Medical Equipment  (From admission, onward)         Start     Ordered   07/29/18 1057  For home use only DME 3 n 1  Once     07/29/18 1056         Follow-up Information    [facility].   Specialty:  Cardiology Why:  08/12/2018 @ 2:00PM, wound check visit Contact information: [address] [place] [address] [phone]         Allergies  Allergen Reactions  . Atorvastatin Hives     Procedures/Studies:    Ct Angio Head W Or Wo Contrast  Result Date: 07/28/2018 CLINICAL DATA:  Stroke workup EXAM: CT ANGIOGRAPHY HEAD AND NECK TECHNIQUE: Multidetector CT imaging of the head and neck was performed using the standard protocol during bolus administration of intravenous contrast. Multiplanar CT image reconstructions and MIPs were obtained to evaluate the vascular anatomy. Carotid stenosis measurements (when applicable) are obtained utilizing NASCET criteria, using the distal internal carotid diameter as the denominator. CONTRAST:  24mL OMNIPAQUE IOHEXOL 350 MG/ML SOLN COMPARISON:  Head CT and brain MRI from yesterday FINDINGS: CTA NECK FINDINGS Aortic arch: Unremarkable Right carotid system: Mild atherosclerotic plaque at the bulb without flow limiting stenosis or ulceration. Left carotid system: Mild atherosclerotic plaque at the bulb without stenosis or ulceration. Vertebral arteries: No proximal subclavian stenosis or ulceration. Essentially codominant vertebral arteries with mild tortuosity but no beading, dissection, or stenosis. Skeleton: C6-7 ACDF with solid arthrodesis. Other neck:  Left thyroid nodule/cyst measuring 18 mm, considered incidental. Upper chest: Biapical emphysema. Review of the MIP images confirms the above findings CTA HEAD FINDINGS Anterior circulation: No branch occlusion, flow limiting stenosis, or beading. There is an inferiorly projecting left supraclinoid ICA aneurysm measuring 6 mm. The sac is smooth. Posterior circulation: The vertebral and basilar arteries are smooth and widely patent. No branch occlusion or flow limiting stenosis. Negative for aneurysm Venous sinuses: Strong dominance of the right transverse sigmoid dural venous sinus. No thrombosis. Anatomic variants: Negative Delayed phase: Known acute left cerebellar infarct. No evidence of interval infarct since MRI yesterday. Review of the MIP images confirms the above findings IMPRESSION: 1. Mild atherosclerosis without flow limiting stenosis or visible embolic source. 2. 6 mm left supraclinoid ICA aneurysm. Electronically Signed   By: Monte Fantasia M.D.   On: 07/28/2018 10:10   Ct Head Wo Contrast  Result Date: 07/27/2018 CLINICAL DATA:  Focal neurologic deficit greater than 6 hours. Left upper extremity numbness and heaviness. EXAM: CT HEAD WITHOUT CONTRAST CT CERVICAL SPINE WITHOUT CONTRAST TECHNIQUE: Multidetector CT imaging of the head and cervical spine was performed following the standard protocol without intravenous contrast. Multiplanar CT image reconstructions of the cervical spine were also generated. COMPARISON:  10/27/2010 head CT. FINDINGS: CT HEAD FINDINGS Brain: Rounded focus of cortical hypodensity in the anterior left cerebellar hemisphere with associated mild local sulcal effacement, new since 2012 head CT. Small focus of encephalomalacia in posteromedial right cerebellar hemisphere, favor remote infarct. No evidence of parenchymal hemorrhage or extra-axial fluid collection. No mass lesion, mass effect, or midline shift. Nonspecific mild subcortical and periventricular white matter  hypodensity, most in keeping with chronic small vessel ischemic change. Cerebral volume is age appropriate. No ventriculomegaly. Vascular: No acute abnormality. Skull: No evidence of calvarial fracture. Sinuses/Orbits: The visualized paranasal sinuses are essentially clear. Other:  The mastoid air cells are unopacified. CT CERVICAL SPINE FINDINGS Alignment: Straightening of the cervical spine. No facet subluxation. Dens is well positioned between the lateral masses of C1. Minimal 2 mm retrolisthesis C5-6. Minimal 2 mm anterolisthesis C7-T1.  Skull base and vertebrae: No acute fracture. No primary bone lesion or focal pathologic process. Soft tissues and spinal canal: No prevertebral edema. No visible canal hematoma. Disc levels: Status post ACDF at C6-7, with no evidence of hardware fracture or loosening. Bony fusion of C6-7 disc space. Moderate to marked multilevel degenerative disc disease in the mid to lower cervical spine, most prominent C5-6. Mild-to-moderate bilateral facet arthropathy. Moderate degenerative foraminal stenosis on the left at C5-6 and C6-7 predominantly due to uncovertebral hypertrophy. Mild-to-moderate degenerative foraminal stenosis on the right at C5-6 due to uncovertebral hypertrophy. Upper chest: No acute abnormality. Other: Visualized mastoid air cells appear clear. Bilateral hypodense thyroid nodules, largest 1.9 cm on the left. No pathologically enlarged cervical nodes. IMPRESSION: 1. Rounded focus of cortical hypodensity in the anterior left cerebellar hemisphere with associated mild local sulcal effacement, new since 2012 head CT, most compatible with acute/early subacute left cerebellar infarct. 2. Small focus of encephalomalacia in the posteromedial right cerebellar hemisphere, favor remote infarct. 3. No acute intracranial hemorrhage. 4. Mild chronic small vessel ischemic changes in the cerebral white matter. 5. Moderate-to-marked multilevel degenerative disc disease in the mid to  lower cervical spine. ACDF C6-7 with no evidence of hardware complication. 6. Mild-to-moderate multilevel degenerative foraminal stenosis in the lower cervical spine as detailed. 7. Dominant 1.9 cm left thyroid lobe nodule. Thyroid ultrasound correlation warranted on a short term outpatient basis if not previously performed. This follows ACR consensus guidelines: Managing Incidental Thyroid Nodules Detected on Imaging: White Paper of the ACR Incidental Thyroid Findings Committee. J Am Coll Radiol 2015; 12:143-150. Critical Value/emergent results were called by telephone at the time of interpretation on 07/27/2018 at 2:20 pm to Dr. Varney Biles , who verbally acknowledged these results. Electronically Signed   By: Ilona Sorrel M.D.   On: 07/27/2018 14:29   Ct Angio Neck W Or Wo Contrast  Result Date: 07/28/2018 CLINICAL DATA:  Stroke workup EXAM: CT ANGIOGRAPHY HEAD AND NECK TECHNIQUE: Multidetector CT imaging of the head and neck was performed using the standard protocol during bolus administration of intravenous contrast. Multiplanar CT image reconstructions and MIPs were obtained to evaluate the vascular anatomy. Carotid stenosis measurements (when applicable) are obtained utilizing NASCET criteria, using the distal internal carotid diameter as the denominator. CONTRAST:  18mL OMNIPAQUE IOHEXOL 350 MG/ML SOLN COMPARISON:  Head CT and brain MRI from yesterday FINDINGS: CTA NECK FINDINGS Aortic arch: Unremarkable Right carotid system: Mild atherosclerotic plaque at the bulb without flow limiting stenosis or ulceration. Left carotid system: Mild atherosclerotic plaque at the bulb without stenosis or ulceration. Vertebral arteries: No proximal subclavian stenosis or ulceration. Essentially codominant vertebral arteries with mild tortuosity but no beading, dissection, or stenosis. Skeleton: C6-7 ACDF with solid arthrodesis. Other neck: Left thyroid nodule/cyst measuring 18 mm, considered incidental. Upper chest:  Biapical emphysema. Review of the MIP images confirms the above findings CTA HEAD FINDINGS Anterior circulation: No branch occlusion, flow limiting stenosis, or beading. There is an inferiorly projecting left supraclinoid ICA aneurysm measuring 6 mm. The sac is smooth. Posterior circulation: The vertebral and basilar arteries are smooth and widely patent. No branch occlusion or flow limiting stenosis. Negative for aneurysm Venous sinuses: Strong dominance of the right transverse sigmoid dural venous sinus. No thrombosis. Anatomic variants: Negative Delayed phase: Known acute left cerebellar infarct. No evidence of interval infarct since MRI yesterday. Review of the MIP images confirms the above findings IMPRESSION: 1. Mild atherosclerosis without flow limiting stenosis or visible embolic source. 2. 6 mm  left supraclinoid ICA aneurysm. Electronically Signed   By: Monte Fantasia M.D.   On: 07/28/2018 10:10   Ct Cervical Spine Wo Contrast  Result Date: 07/27/2018 CLINICAL DATA:  Focal neurologic deficit greater than 6 hours. Left upper extremity numbness and heaviness. EXAM: CT HEAD WITHOUT CONTRAST CT CERVICAL SPINE WITHOUT CONTRAST TECHNIQUE: Multidetector CT imaging of the head and cervical spine was performed following the standard protocol without intravenous contrast. Multiplanar CT image reconstructions of the cervical spine were also generated. COMPARISON:  10/27/2010 head CT. FINDINGS: CT HEAD FINDINGS Brain: Rounded focus of cortical hypodensity in the anterior left cerebellar hemisphere with associated mild local sulcal effacement, new since 2012 head CT. Small focus of encephalomalacia in posteromedial right cerebellar hemisphere, favor remote infarct. No evidence of parenchymal hemorrhage or extra-axial fluid collection. No mass lesion, mass effect, or midline shift. Nonspecific mild subcortical and periventricular white matter hypodensity, most in keeping with chronic small vessel ischemic change.  Cerebral volume is age appropriate. No ventriculomegaly. Vascular: No acute abnormality. Skull: No evidence of calvarial fracture. Sinuses/Orbits: The visualized paranasal sinuses are essentially clear. Other:  The mastoid air cells are unopacified. CT CERVICAL SPINE FINDINGS Alignment: Straightening of the cervical spine. No facet subluxation. Dens is well positioned between the lateral masses of C1. Minimal 2 mm retrolisthesis C5-6. Minimal 2 mm anterolisthesis C7-T1. Skull base and vertebrae: No acute fracture. No primary bone lesion or focal pathologic process. Soft tissues and spinal canal: No prevertebral edema. No visible canal hematoma. Disc levels: Status post ACDF at C6-7, with no evidence of hardware fracture or loosening. Bony fusion of C6-7 disc space. Moderate to marked multilevel degenerative disc disease in the mid to lower cervical spine, most prominent C5-6. Mild-to-moderate bilateral facet arthropathy. Moderate degenerative foraminal stenosis on the left at C5-6 and C6-7 predominantly due to uncovertebral hypertrophy. Mild-to-moderate degenerative foraminal stenosis on the right at C5-6 due to uncovertebral hypertrophy. Upper chest: No acute abnormality. Other: Visualized mastoid air cells appear clear. Bilateral hypodense thyroid nodules, largest 1.9 cm on the left. No pathologically enlarged cervical nodes. IMPRESSION: 1. Rounded focus of cortical hypodensity in the anterior left cerebellar hemisphere with associated mild local sulcal effacement, new since 2012 head CT, most compatible with acute/early subacute left cerebellar infarct. 2. Small focus of encephalomalacia in the posteromedial right cerebellar hemisphere, favor remote infarct. 3. No acute intracranial hemorrhage. 4. Mild chronic small vessel ischemic changes in the cerebral white matter. 5. 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 ACDF C6-7 with no evidence of hardware  complication. 6. Mild-to-moderate multilevel degenerative foraminal stenosis in the lower cervical spine as detailed. 7. Dominant 1.9 cm left thyroid lobe nodule. Thyroid ultrasound correlation warranted on a short term outpatient basis if not previously performed. This follows ACR consensus guidelines: Managing Incidental Thyroid Nodules Detected on Imaging: White Paper of the ACR Incidental Thyroid Findings Committee. J Am Coll Radiol 2015; 12:143-150. Critical Value/emergent results were called by telephone at the time of interpretation on 07/27/2018 at 2:20 pm to Dr. Varney Biles , who verbally acknowledged these results. Electronically Signed   By: Ilona Sorrel M.D.   On: 07/27/2018 14:29   Mr Brain Wo Contrast  Result Date: 07/27/2018 CLINICAL DATA:  Left hand weakness for 4 days EXAM: MRI HEAD WITHOUT CONTRAST TECHNIQUE: Multiplanar, multiecho pulse sequences of the brain and surrounding structures were obtained without intravenous contrast. COMPARISON:  Head CT from earlier today FINDINGS: Brain: Patchy restricted diffusion in the left mid to  upper cerebellum involving a moderate to large area. There have been remote bilateral smaller cerebellar infarcts. Small remote bilateral occipital and parietal cortically based infarcts. Mild chronic small vessel ischemic change in the cerebral white matter. Brain volume is normal Focus of chronic blood products along the lateral right frontal lobe, nonspecific in isolation. No masslike finding or hydrocephalus. Vascular: Major flow voids are preserved Skull and upper cervical spine: Negative for marrow lesion Sinuses/Orbits: Negative IMPRESSION: 1. 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 2. Multiple small remote posterior circulation infarcts, suggest vascular imaging. Electronically Signed   By: Monte Fantasia M.D.   On: 07/27/2018 14:56   Vas [place] Lower Extremity Venous (dvt)  Result Date: 07/29/2018  Lower Venous Study Indications: Stroke.   Comparison Study: No prior study on file for comparison Performing Technologist: Sharion Dove RVS  Examination Guidelines: A complete evaluation includes B-mode imaging, spectral Doppler, color Doppler, and power Doppler as needed of all accessible portions of each vessel. Bilateral testing is considered an integral part of a complete examination. Limited examinations for reoccurring indications may be performed as noted.  +---------+---------------+---------+-----------+----------+-------+ RIGHT    CompressibilityPhasicitySpontaneityPropertiesSummary +---------+---------------+---------+-----------+----------+-------+ CFV      Full           Yes      Yes                          +---------+---------------+---------+-----------+----------+-------+ SFJ      Full                                                 +---------+---------------+---------+-----------+----------+-------+ FV Prox  Full                                                 +---------+---------------+---------+-----------+----------+-------+ FV Mid   Full                                                 +---------+---------------+---------+-----------+----------+-------+ FV DistalFull                                                 +---------+---------------+---------+-----------+----------+-------+ PFV      Full                                                 +---------+---------------+---------+-----------+----------+-------+ POP      Full           Yes      Yes                          +---------+---------------+---------+-----------+----------+-------+ PTV      Full                                                 +---------+---------------+---------+-----------+----------+-------+  PERO     Full                                                 +---------+---------------+---------+-----------+----------+-------+   +---------+---------------+---------+-----------+----------+-------+  LEFT     CompressibilityPhasicitySpontaneityPropertiesSummary +---------+---------------+---------+-----------+----------+-------+ CFV      Full           Yes      Yes                          +---------+---------------+---------+-----------+----------+-------+ SFJ      Full                                                 +---------+---------------+---------+-----------+----------+-------+ FV Prox  Full                                                 +---------+---------------+---------+-----------+----------+-------+ FV Mid   Full                                                 +---------+---------------+---------+-----------+----------+-------+ FV DistalFull                                                 +---------+---------------+---------+-----------+----------+-------+ PFV      Full                                                 +---------+---------------+---------+-----------+----------+-------+ POP      Full           Yes      Yes                          +---------+---------------+---------+-----------+----------+-------+ PTV      Full                                                 +---------+---------------+---------+-----------+----------+-------+ PERO     Full                                                 +---------+---------------+---------+-----------+----------+-------+     Summary: Right: There is no evidence of deep vein thrombosis in the lower extremity. Left: There is no evidence of deep vein thrombosis in the lower extremity.  *See table(s) above for measurements and observations. Electronically signed by Harold Barban MD on 07/29/2018 at 11:09:33 AM.  Final      The results of significant diagnostics from this hospitalization (including imaging, microbiology, ancillary and laboratory) are listed below for reference.     Microbiology: Recent Results (from the past 240 hour(s))  SARS Coronavirus 2 [facility] order,  Performed in [facility] hospital lab)     Status: None   Collection Time: 07/27/18  2:30 PM  Result Value Ref Range Status   SARS Coronavirus 2 NEGATIVE NEGATIVE Final    Comment: (NOTE) If result is NEGATIVE SARS-CoV-2 target nucleic acids are NOT DETECTED. The SARS-CoV-2 RNA is generally detectable in upper and lower  respiratory specimens during the acute phase of infection. The lowest  concentration of SARS-CoV-2 viral copies this assay can detect is 250  copies / mL. A negative result does not preclude SARS-CoV-2 infection  and should not be used as the sole basis for treatment or other  patient management decisions.  A negative result may occur with  improper specimen collection / handling, submission of specimen other  than nasopharyngeal swab, presence of viral mutation(s) within the  areas targeted by this assay, and inadequate number of viral copies  (<250 copies / mL). A negative result must be combined with clinical  observations, patient history, and epidemiological information. If result is POSITIVE SARS-CoV-2 target nucleic acids are DETECTED. The SARS-CoV-2 RNA is generally detectable in upper and lower  respiratory specimens dur ing the acute phase of infection.  Positive  results are indicative of active infection with SARS-CoV-2.  Clinical  correlation with patient history and other diagnostic information is  necessary to determine patient infection status.  Positive results do  not rule out bacterial infection or co-infection with other viruses. If result is PRESUMPTIVE POSTIVE SARS-CoV-2 nucleic acids MAY BE PRESENT.   A presumptive positive result was obtained on the submitted specimen  and confirmed on repeat testing.  While 2019 novel coronavirus  (SARS-CoV-2) nucleic acids may be present in the submitted sample  additional confirmatory testing may be necessary for epidemiological  and / or clinical management purposes  to differentiate between  SARS-CoV-2  and other Sarbecovirus currently known to infect humans.  If clinically indicated additional testing with an alternate test  methodology [phone]) is advised. The SARS-CoV-2 RNA is generally  detectable in upper and lower respiratory sp ecimens during the acute  phase of infection. The expected result is Negative. Fact Sheet for Patients:  StrictlyIdeas.no Fact Sheet for Healthcare Providers: BankingDealers.co.za This test is not yet approved or cleared by the [place] FDA and has been authorized for detection and/or diagnosis of SARS-CoV-2 by FDA under an Emergency Use Authorization (EUA).  This EUA will remain in effect (meaning this test can be used) for the duration of the COVID-19 declaration under Section 564(b)(1) of the Act, 21 U.S.C. section 360bbb-3(b)(1), unless the authorization is terminated or revoked sooner. Performed at [facility], [place] [address], [place], [place] [address]      Labs: BNP (last 3 results) No results for input(s): BNP in the last 8760 hours. Basic Metabolic Panel: Recent Labs  Lab 07/27/18 1343 07/27/18 1543 07/29/18 0531  NA 140  --  143  K 3.8  --  3.5  CL 107  --  108  CO2 25  --  22  GLUCOSE 94  --  119*  BUN 19  --  17  CREATININE 1.38* 1.27* 1.31*  CALCIUM 9.3  --  9.2  MG  --   --  1.7  Liver Function Tests: Recent Labs  Lab 07/27/18 1343  AST 15  ALT 12  ALKPHOS 105  BILITOT 0.5  PROT 7.5  ALBUMIN 4.1   No results for input(s): LIPASE, AMYLASE in the last 168 hours. No results for input(s): AMMONIA in the last 168 hours. CBC: Recent Labs  Lab 07/27/18 1343 07/27/18 1543 07/29/18 0531  WBC 7.1 7.5 7.2  NEUTROABS 4.5  --   --   HGB 11.4* 11.3* 11.4*  HCT 35.1* 33.6* 33.3*  MCV 93.1 92.6 87.2  PLT 176 171 192   Cardiac Enzymes: No results for input(s): CKTOTAL, CKMB, CKMBINDEX, TROPONINI in the last 168 hours. BNP: Invalid input(s):  POCBNP CBG: Recent Labs  Lab 07/29/18 0758 07/29/18 1302  GLUCAP 110* 132*   D-Dimer No results for input(s): DDIMER in the last 72 hours. Hgb A1c No results for input(s): HGBA1C in the last 72 hours. Lipid Profile No results for input(s): CHOL, HDL, LDLCALC, TRIG, CHOLHDL, LDLDIRECT in the last 72 hours. Thyroid function studies Recent Labs    07/27/18 1543  TSH 1.289   Anemia work up No results for input(s): VITAMINB12, FOLATE, FERRITIN, TIBC, IRON, RETICCTPCT in the last 72 hours. Urinalysis    Component Value Date/Time   COLORURINE YELLOW 07/27/2018 1309   APPEARANCEUR CLEAR 07/27/2018 1309   LABSPEC 1.010 07/27/2018 1309   PHURINE 7.0 07/27/2018 1309   GLUCOSEU NEGATIVE 07/27/2018 1309   HGBUR NEGATIVE 07/27/2018 1309   HGBUR negative 09/15/2009 0901   BILIRUBINUR NEGATIVE 07/27/2018 1309   BILIRUBINUR n 12/07/2015 1609   KETONESUR NEGATIVE 07/27/2018 1309   PROTEINUR NEGATIVE 07/27/2018 1309   UROBILINOGEN 1.0 12/07/2015 1609   UROBILINOGEN 1.0 09/15/2009 0901   NITRITE NEGATIVE 07/27/2018 1309   LEUKOCYTESUR NEGATIVE 07/27/2018 1309   Sepsis Labs Invalid input(s): PROCALCITONIN,  WBC,  LACTICIDVEN Microbiology Recent Results (from the past 240 hour(s))  SARS Coronavirus 2 [facility] order, Performed in [place] hospital lab)     Status: None   Collection Time: 07/27/18  2:30 PM  Result Value Ref Range Status   SARS Coronavirus 2 NEGATIVE NEGATIVE Final    Comment: (NOTE) If result is NEGATIVE SARS-CoV-2 target nucleic acids are NOT DETECTED. The SARS-CoV-2 RNA is generally detectable in upper and lower  respiratory specimens during the acute phase of infection. The lowest  concentration of SARS-CoV-2 viral copies this assay can detect is 250  copies / mL. A negative result does not preclude SARS-CoV-2 infection  and should not be used as the sole basis for treatment or other  patient management decisions.  A negative result may occur with  improper  specimen collection / handling, submission of specimen other  than nasopharyngeal swab, presence of viral mutation(s) within the  areas targeted by this assay, and inadequate number of viral copies  (<250 copies / mL). A negative result must be combined with clinical  observations, patient history, and epidemiological information. If result is POSITIVE SARS-CoV-2 target nucleic acids are DETECTED. The SARS-CoV-2 RNA is generally detectable in upper and lower  respiratory specimens dur ing the acute phase of infection.  Positive  results are indicative of active infection with SARS-CoV-2.  Clinical  correlation with patient history and other diagnostic information is  necessary to determine patient infection status.  Positive results do  not rule out bacterial infection or co-infection with other viruses. If result is PRESUMPTIVE POSTIVE SARS-CoV-2 nucleic acids MAY BE PRESENT.   A presumptive positive result was obtained on the submitted specimen  and  confirmed on repeat testing.  While 2019 novel coronavirus  (SARS-CoV-2) nucleic acids may be present in the submitted sample  additional confirmatory testing may be necessary for epidemiological  and / or clinical management purposes  to differentiate between  SARS-CoV-2 and other Sarbecovirus currently known to infect humans.  If clinically indicated additional testing with an alternate test  methodology [phone]) is advised. The SARS-CoV-2 RNA is generally  detectable in upper and lower respiratory sp ecimens during the acute  phase of infection. The expected result is Negative. Fact Sheet for Patients:  StrictlyIdeas.no Fact Sheet for Healthcare Providers: BankingDealers.co.za This test is not yet approved or cleared by the [place] FDA and has been authorized for detection and/or diagnosis of SARS-CoV-2 by FDA under an Emergency Use Authorization (EUA).  This EUA will remain in  effect (meaning this test can be used) for the duration of the COVID-19 declaration under Section 564(b)(1) of the Act, 21 U.S.C. section 360bbb-3(b)(1), unless the authorization is terminated or revoked sooner. Performed at [facility], [place] [address], [place], [place] [address]      Time coordinating discharge in minutes: 55  SIGNED:   Debbe Odea, MD  [facility] 07/29/2018, 2:02 PM Pager   If 7PM-7AM, please contact night-coverage [email or web address] Password [email or web address]

## 2018-07-29 NOTE — Discharge Instructions (Signed)
Implant site/wound care instructions Keep incision clean and dry for 3 days. You can remove outer dressing tomorrow. Leave steri-strips (little pieces of tape) on until seen in the office for wound check appointment. Call the office [phone]) for redness, drainage, swelling, or fever.  NOTE: Aciphex changed to Pantropazole as it interacts with Plavix  You were cared for by a hospitalist during your hospital stay. If you have any questions about your discharge medications or the care you received while you were in the hospital after you are discharged, you can call the unit and asked to speak with the hospitalist on call if the hospitalist that took care of you is not available. Once you are discharged, your primary care physician will handle any further medical issues.   Please note that NO REFILLS for any discharge medications will be authorized once you are discharged, as it is imperative that you return to your primary care physician (or establish a relationship with a primary care physician if you do not have one) for your aftercare needs so that they can reassess your need for medications and monitor your lab values.  Please take all your medications with you for your next visit with your Primary MD. Please ask your Primary MD to get all Hospital records sent to his/her office. Please request your Primary MD to go over all hospital test results at the follow up.   If you experience worsening of your admission symptoms, develop shortness of breath, chest pain, suicidal or homicidal thoughts or a life threatening emergency, you must seek medical attention immediately by calling 911 or calling your MD.   Dennis Bast must read the complete instructions/literature along with all the possible adverse reactions/side effects for all the medicines you take including new medications that have been prescribed to you. Take new medicines after you have completely understood and accpet all the possible adverse  reactions/side effects.    Do not drive when taking pain medications or sedatives.     Do not take more than prescribed Pain, Sleep and Anxiety Medications   If you have smoked or chewed Tobacco in the last 2 yrs please stop. Stop any regular alcohol  and or recreational drug use.   Wear Seat belts while driving.

## 2018-07-29 NOTE — TOC Initial Note (Signed)
Transition of Care [facility]) - Initial/Assessment Note    Patient Details  Name: Kelsey Sparks MRN: [id number] Date of Birth: 07-30-1945  [facility]) CM/SW Contact:    Pollie Friar, RN Phone Number: 07/29/2018, 11:52 AM  Clinical Narrative:                 Pt denies issues with home medications. She also denies issues with transportation.  Expected Discharge Plan: [place] Barriers to Discharge: Continued Medical Work up   Patient Goals and CMS Choice   CMS Medicare.gov Compare Post Acute Care list provided to:: Patient Choice offered to / list presented to : Patient  Expected Discharge Plan and Services Expected Discharge Plan: [place]   Discharge Planning Services: CM Consult Post Acute Care Choice: Home Health, Durable Medical Equipment Living arrangements for the past 2 months: Single Family Home(one level)                           HH Arranged: RN, OT, Speech Therapy HH Agency: [facility] ([place]) Date HH Agency Contacted: 07/29/18 Time HH Agency Contacted: 1100 Representative spoke with at [place]: Butch Penny  Prior Living Arrangements/Services Living arrangements for the past 2 months: Single Family Home(one level) Lives with:: Self Patient language and need for interpreter reviewed:: Yes(no needs) Do you feel safe going back to the place where you live?: Yes      Need for Family Participation in Patient Care: No (Comment) Care giver support system in place?: No (comment)(niece can check in on but no 24 hour supervision)   Criminal Activity/Legal Involvement Pertinent to Current Situation/Hospitalization: No - Comment as needed  Activities of Daily Living Home Assistive Devices/Equipment: None ADL Screening (condition at time of admission) Patient's cognitive ability adequate to safely complete daily activities?: Yes Is the patient deaf or have difficulty hearing?: No Does the patient have  difficulty seeing, even when wearing glasses/contacts?: No Does the patient have difficulty concentrating, remembering, or making decisions?: No Patient able to express need for assistance with ADLs?: Yes Does the patient have difficulty dressing or bathing?: No Independently performs ADLs?: Yes (appropriate for developmental age) Does the patient have difficulty walking or climbing stairs?: No Weakness of Legs: None Weakness of Arms/Hands: None  Permission Sought/Granted                  Emotional Assessment Appearance:: Appears stated age Attitude/Demeanor/Rapport: Engaged Affect (typically observed): Accepting, Appropriate, Pleasant Orientation: : Oriented to Place, Oriented to Self, Oriented to  Time, Oriented to Situation   Psych Involvement: No (comment)  Admission diagnosis:  Acute ischemic stroke [facility]) [I63.9] Patient Active Problem List   Diagnosis Date Noted  . Acute CVA (cerebrovascular accident) ([place]) 07/27/2018  . Osteopenia 08/14/2016  . Primary osteoarthritis of right knee 11/24/2015  . Lumbar radiculopathy, chronic 10/19/2015  . Lumbar spondylosis with myelopathy 10/19/2015  . Tendinitis of left rotator cuff 07/20/2015  . 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 Osteoarthritis of both hands 02/24/2015  . Osteoarthritis of left knee 02/24/2015  . Skin tag of vulva 10/10/2012  . Dense breasts 08/05/2012  . Trigeminal neuralgia 11/03/2010  . VAGINITIS, ATROPHIC 09/09/2009  . ANEMIA, PERNICIOUS 06/27/2007  . PERIPHERAL VASCULAR DISEASE 06/25/2007  . ALLERGIC RHINITIS 06/25/2007  . Hyperlipidemia 06/04/2007  . Essential hypertension 06/04/2007  . Arthropathy 06/04/2007   PCP:  Dorothyann Peng, NP Pharmacy:   [facility] [place],  [place] - [place] [address] [address] Phone: [phone] Fax: [phone]  [place], [place] [facility] [address] [place] [address] [place] [address] Phone: [phone] Fax: [phone]  [facility] [place], [place] - [address] [place] [address] Phone: [phone] Fax: [phone]     Social Determinants of Health (SDOH) Interventions    Readmission Risk Interventions No flowsheet data found.

## 2018-07-29 NOTE — Progress Notes (Signed)
Occupational Therapy Treatment Patient Details Name: Kelsey Sparks MRN: [id number] DOB: 1945/09/27 Today's Date: 07/29/2018    History of present illness 73 y.o. female with PMH HTN, HLD, trigeminal neuralgia with moderate left cerebellar infarct.   OT comments  Patient pleasant and cooperative.  Continues to present with L UE dysmetria and decreased strength.  Provided and reviewed HEP for [facility] exercises and theraputty. Agreeable to engage in exercises program 2-3x/day.  Pt with no further questions or concerns, plan to dc home today. DC plan remains appropriate, with continued OT services.    Follow Up Recommendations  Outpatient OT    Equipment Recommendations  3 in 1 bedside commode    Recommendations for Other Services      Precautions / Restrictions Precautions Precautions: Fall Restrictions Weight Bearing Restrictions: No       Mobility Bed Mobility Overal bed mobility: Independent             General bed mobility comments: returned to bed at end of session, no assist required  Transfers Overall transfer level: Modified independent Equipment used: None             General transfer comment: basic transfer without assist    Balance Overall balance assessment: No apparent balance deficits (not formally assessed)                                         ADL either performed or assessed with clinical judgement   ADL Overall ADL's : Needs assistance/impaired                         Toilet Transfer: Modified Independent;Ambulation Toilet Transfer Details (indicate cue type and reason): simulated to EOB           General ADL Comments: session focused on neuro reeducation and HEP to L UE      Vision       Perception     Praxis      Cognition Arousal/Alertness: Awake/alert Behavior During Therapy: WFL for tasks assessed/performed Overall Cognitive Status: Within Functional Limits for tasks assessed                                           Exercises Exercises: Other exercises Other Exercises Other Exercises: provided HEP for [facility] exercises, completed hand exericses: gross grasp/extension, finger ab/addcution, finger isolation extension, tip to tip  Other Exercises: proivded with yellow theraputty and HEP, reviewed exericses for L UE: gross grasp, rolling, tip to tip pinch, lateral pinch, intrinsic strengthening    Shoulder Instructions       General Comments      Pertinent Vitals/ Pain       Pain Assessment: No/denies pain  Home Living                                          Prior Functioning/Environment              Frequency  Min 2X/week        Progress Toward Goals  OT Goals(current goals can now be found in the care plan section)  Progress towards OT goals: Progressing toward goals  Acute  Rehab OT Goals Patient Stated Goal: to get stronger OT Goal Formulation: With patient Time For Goal Achievement: 08/11/18 Potential to Achieve Goals: Good  Plan Discharge plan remains appropriate;Frequency remains appropriate    Co-evaluation                 AM-PAC OT "6 Clicks" Daily Activity     Outcome Measure   Help from another person eating meals?: A Little Help from another person taking care of personal grooming?: None Help from another person toileting, which includes using toliet, bedpan, or urinal?: None Help from another person bathing (including washing, rinsing, drying)?: A Little Help from another person to put on and taking off regular upper body clothing?: A Little Help from another person to put on and taking off regular lower body clothing?: A Little 6 Click Score: 20    End of Session    OT Visit Diagnosis: Muscle weakness (generalized) (M62.81);Hemiplegia and hemiparesis Hemiplegia - Right/Left: Left Hemiplegia - dominant/non-dominant: Non-Dominant Hemiplegia - caused by: Cerebral infarction   Activity  Tolerance Patient tolerated treatment well   Patient Left in bed;with call bell/phone within reach   Nurse Communication Mobility status        Time: 7829-5621 OT Time Calculation (min): 16 min  Charges: OT General Charges $OT Visit: 1 Visit OT Treatments $Neuromuscular Re-education: 8-22 mins  Delight Stare, OT Acute Rehabilitation Services Pager [phone] Office [place] 07/29/2018, 4:57 PM

## 2018-07-29 NOTE — Telephone Encounter (Signed)
Niece states her aunt was admitted to the hospital yesterday morning for a stroke. The dr tried to video conference her, but she was unable to accept his call. She would like to speak with the dr and let him know what her dx is. She is currently at [facility] unit [place] room 328.

## 2018-07-29 NOTE — Progress Notes (Signed)
Patient is d/c home, post loop recorder, patient is stable, d/c summary and teaching done with patient, denies concerns at this time. Iv and tele d/c. Patient niece is taking her home.

## 2018-07-29 NOTE — Consult Note (Addendum)
ELECTROPHYSIOLOGY CONSULT NOTE  Patient ID: Kelsey Sparks MRN: [id number], DOB/AGE: Jan 08, 1946   Admit date: 07/27/2018 Date of Consult: 07/29/2018  Primary Physician: Dorothyann Peng, NP Primary Cardiologist: none Reason for Consultation: Cryptogenic stroke ; recommendations regarding Implantable Loop Recorder, requested by Dr. Erlinda Hong  History of Present Illness MADDI COLLAR was admitted on 07/27/2018 with LUE weakness.   PMHx noted for HTN, HLD, migraine HAs, PVD, trigeminal neuralgia Neurology noted:left SCA territory scattered infarcts concerning for embolic patten given chronic asymptomatic bilateral infarcts on MRI .  she has undergone workup for stroke including echocardiogram and carotid angio.  The patient has been monitored on telemetry which has demonstrated sinus rhythm with no arrhythmias.  Inpatient stroke work-up will not include TEE given COVID restrictions.   Echocardiogram this admission demonstrated   IMPRESSIONS  1. The left ventricle has hyperdynamic systolic function, with an ejection fraction of >65%. The cavity size was normal. Left ventricular diastolic Doppler parameters are consistent with impaired relaxation.  2. The right ventricle has normal systolc function. The cavity was normal. There is no increase in right ventricular wall thickness.  3. No shunt as tested with injection of agitated saline.  4. The aortic valve is tricuspid.  5. The aortic root is normal in size and structure.  FINDINGS  Left Ventricle: The left ventricle has hyperdynamic systolic function, with an ejection fraction of >65%. The cavity size was normal. There is no increase in left ventricular wall thickness. Left ventricular diastolic Doppler parameters are consistent  with impaired relaxation (grade I).  Right Ventricle: The right ventricle has normal systolic function. The cavity was normal. There is no increase in right ventricular wall thickness.  Left Atrium: Left atrial size  was normal in size. No shunt as tested with injection of agitated saline.  Right Atrium: Right atrial size was normal in size. Right atrial pressure is estimated at 10 mmHg.  Interatrial Septum: Agitated saline contrast was given intravenously to evaluate for intracardiac shunting.  Pericardium: There is no evidence of pericardial effusion.  Mitral Valve: The mitral valve is normal in structure. Mitral valve regurgitation is trivial by color flow Doppler.  Tricuspid Valve: The tricuspid valve was normal in structure. Tricuspid valve regurgitation is trivial by color flow Doppler.  Aortic Valve: The aortic valve is tricuspid Aortic valve regurgitation was not visualized by color flow Doppler.  Pulmonic Valve: The pulmonic valve was grossly normal. Pulmonic valve regurgitation is not visualized by color flow Doppler.  Aorta: The aortic root is normal in size and structure.  Venous: The inferior vena cava is normal in size with greater than 50% respiratory variability.    Lab work is reviewed.  Prior to admission, the patient denies chest pain, shortness of breath, dizziness, palpitations, or syncope.  She is recovering from their stroke with plans to home at discharge, in d/w neurology, today.      Past Medical History:  Diagnosis Date   Adenomatous colon polyp    Allergy    Anemia    Arthritis    Constipation    pt uses metamucil   Esophageal stricture    GERD (gastroesophageal reflux disease)    Hiatal hernia    Hyperlipidemia    Hypertension    IBS (irritable bowel syndrome)    Low back pain    Macular degeneration    Muscular degeneration    Peripheral vascular disease [facility])      Surgical History:  Past Surgical History:  Procedure Laterality Date  BACK SURGERY     CHOLECYSTECTOMY     COLONOSCOPY     KNEE SURGERY     LUMBAR DISC SURGERY     NECK SURGERY     POLYPECTOMY     right small toe surgery     TOTAL ABDOMINAL  HYSTERECTOMY W/ BILATERAL SALPINGOOPHORECTOMY     UPPER GASTROINTESTINAL ENDOSCOPY       Medications Prior to Admission  Medication Sig Dispense Refill Last Dose   aspirin EC 81 MG tablet Take 81 mg by mouth daily.   07/27/2018 at am   atenolol (TENORMIN) 50 MG tablet TAKE 1 TABLET(50 MG) BY MOUTH EVERY MORNING (Patient taking differently: Take 50 mg by mouth daily. ) 90 tablet 1 07/27/2018 at 830   baclofen (LIORESAL) 10 MG tablet Take 10 mg by mouth 2 (two) times daily as needed (headache). Max 2 treatments per week   07/23/2018   cetirizine (ZYRTEC) 10 MG tablet Take 10 mg by mouth daily.   07/27/2018 at am   conjugated estrogens (PREMARIN) vaginal cream 0.5 applicatorful vaginally twice weekly. (Patient taking differently: Place 0.5 Applicatorfuls vaginally See admin instructions. Apply 1/2 applicatorful vaginally up to twice weekly as needed for dryness) 30 g 0 2 weeks ago   fluticasone (FLONASE) 50 MCG/ACT nasal spray instill 2 sprays into each nostril once daily (Patient taking differently: Place 2 sprays into both nostrils daily. ) 48 g 1 07/27/2018 at am   gabapentin (NEURONTIN) 100 MG capsule Take 1 capsule (100 mg total) by mouth 3 (three) times daily. (Patient taking differently: Take 300 mg by mouth daily. ) 270 capsule 2 07/27/2018 at am   hydrocortisone (ANUSOL-HC) 25 MG suppository Place 1 suppository (25 mg total) rectally 2 (two) times daily as needed for hemorrhoids or itching. 30 suppository 6 several weeks ago   meclizine (ANTIVERT) 25 MG tablet TAKE 1/2 TO 1 TABLET EVERY 6 HOURS IF NEEDED (Patient taking differently: Take 12.5 mg by mouth every 6 (six) hours as needed for dizziness (vertigo). ) 40 tablet 0 07/23/2018   Polyethyl Glycol-Propyl Glycol (SYSTANE) 0.4-0.3 % SOLN Apply 2 drops to eye 2 (two) times daily. (Patient taking differently: Place 2 drops into both eyes daily. ) 4 mL 3 07/26/2018 at am   RABEprazole (ACIPHEX) 20 MG tablet Take 1 tablet (20 mg total) by mouth  daily. 90 tablet 1 07/27/2018 at am   simvastatin (ZOCOR) 40 MG tablet Take 1 tablet (40 mg total) by mouth daily. 90 tablet 1 07/27/2018 at am   traMADol (ULTRAM) 50 MG tablet Take 1-2 tabletsby mouth every 8 hours for pain (Patient taking differently: Take 50 mg by mouth 2 (two) times a day. ) 135 tablet 5 07/27/2018 at am   triamterene-hydrochlorothiazide (MAXZIDE-25) 37.5-25 MG tablet Take 1 tablet by mouth every morning. (Patient taking differently: Take 1 tablet by mouth daily. ) 90 tablet 1 07/27/2018 at am   VOLTAREN 1 % GEL Apply 2 g topically 4 (four) times daily. (Patient taking differently: Apply 2 g topically 4 (four) times daily as needed (arthritis pain). ) 3 Tube 6 07/26/2018 at pm   meloxicam (MOBIC) 15 MG tablet Take 1 tablet (15 mg total) by mouth daily. (Patient not taking: Reported on 07/27/2018) 90 tablet 2 Not Taking at Unknown time   zonisamide (ZONEGRAN) 50 MG capsule Take 50 mg by mouth at bedtime. Increase as directed; for Migraines  0 07/25/2018    Inpatient Medications:   aspirin EC  81 mg Oral Daily  clopidogrel  75 mg Oral Daily   enoxaparin (LOVENOX) injection  40 mg Subcutaneous Q24H   gabapentin  100 mg Oral TID   pantoprazole  40 mg Oral Daily    Allergies:  Allergies  Allergen Reactions   Atorvastatin Hives    Social History   Socioeconomic History   Marital status: Married    Spouse name: Not on file   Number of children: Not on file   Years of education: Not on file   Highest education level: Not on file  Occupational History   Not on file  Social Needs   Financial resource strain: Not on file   Food insecurity:    Worry: Not on file    Inability: Not on file   Transportation needs:    Medical: Not on file    Non-medical: Not on file  Tobacco Use   Smoking status: Former Smoker    Packs/day: 0.50    Years: 25.00    Pack years: 12.50    Last attempt to quit: 03/27/2008    Years since quitting: 10.3   Smokeless tobacco: Never  Used  Substance and Sexual Activity   Alcohol use: No   Drug use: No   Sexual activity: Not on file  Lifestyle   Physical activity:    Days per week: Not on file    Minutes per session: Not on file   Stress: Not on file  Relationships   Social connections:    Talks on phone: Not on file    Gets together: Not on file    Attends religious service: Not on file    Active member of club or organization: Not on file    Attends meetings of clubs or organizations: Not on file    Relationship status: Not on file   Intimate partner violence:    Fear of current or ex partner: Not on file    Emotionally abused: Not on file    Physically abused: Not on file    Forced sexual activity: Not on file  Other Topics Concern   Not on file  Social History Narrative   Not on file     Family History  Problem Relation Age of Onset   Arthritis Father    Hypertension Father    Arthritis Mother    Asthma Mother    Breast cancer Maternal Aunt    Colon cancer Neg Hx    Colon polyps Neg Hx    Rectal cancer Neg Hx    Stomach cancer Neg Hx       Review of Systems: All other systems reviewed and are otherwise negative except as noted above.  Physical Exam: Vitals:   07/28/18 2104 07/28/18 2350 07/29/18 0338 07/29/18 0740  BP: (!) 147/91 (!) 144/81 133/83 116/79  Pulse: 100 91 98 (!) 102  Resp: 18 18 18 18   Temp: 97.8 F (36.6 C) 98 F (36.7 C) 97.9 F (36.6 C) 98.4 F (36.9 C)  TempSrc: Oral Oral Oral Oral  SpO2: 99% 99% 98% 100%  Weight:      Height:       LIMITED exam given COVID GEN- The patient is well appearing, alert and oriented x 3 today.   Head- normocephalic, atraumatic Eyes-  Sclera clear, conjunctiva pink Ears- hearing intact Neck- supple Lungs- normal WOB Heart- not examined, telemetry and echo reviewed GI- not examined Extremities- no clubbing, cyanosis, or edema MS- no obvious deformity, age approprite atrophy Skin- no rash or lesion Psych-  euthymic mood, full affect   Labs:   Lab Results  Component Value Date   WBC 7.2 07/29/2018   HGB 11.4 (L) 07/29/2018   HCT 33.3 (L) 07/29/2018   MCV 87.2 07/29/2018   PLT 192 07/29/2018    Recent Labs  Lab 07/27/18 1343  07/29/18 0531  NA 140  --  143  K 3.8  --  3.5  CL 107  --  108  CO2 25  --  22  BUN 19  --  17  CREATININE 1.38*   < > 1.31*  CALCIUM 9.3  --  9.2  PROT 7.5  --   --   BILITOT 0.5  --   --   ALKPHOS 105  --   --   ALT 12  --   --   AST 15  --   --   GLUCOSE 94  --  119*   < > = values in this interval not displayed.   No results found for: CKTOTAL, CKMB, CKMBINDEX, TROPONINI Lab Results  Component Value Date   CHOL 206 (H) 12/18/2017   CHOL 192 12/13/2016   CHOL 194 12/07/2015   Lab Results  Component Value Date   HDL 38.50 (L) 12/18/2017   HDL 51.50 12/13/2016   HDL 42.00 12/07/2015   Lab Results  Component Value Date   LDLCALC 104 (H) 12/13/2016   LDLCALC 97 12/04/2014   LDLCALC 92 10/06/2013   Lab Results  Component Value Date   TRIG 270.0 (H) 12/18/2017   TRIG 182.0 (H) 12/13/2016   TRIG 211.0 (H) 12/07/2015   Lab Results  Component Value Date   CHOLHDL 5 12/18/2017   CHOLHDL 4 12/13/2016   CHOLHDL 5 12/07/2015   Lab Results  Component Value Date   LDLDIRECT 120.0 12/18/2017   LDLDIRECT 109.0 12/07/2015   LDLDIRECT 95.4 09/15/2009    No results found for: DDIMER   Radiology/Studies:   Ct Angio Head W Or Wo Contrast Result Date: 07/28/2018 CLINICAL DATA:  Stroke workup EXAM: CT ANGIOGRAPHY HEAD AND NECK TECHNIQUE: Multidetector CT imaging of the head and neck was performed using the standard protocol during bolus administration of intravenous contrast. Multiplanar CT image reconstructions and MIPs were obtained to evaluate the vascular anatomy. Carotid stenosis measurements (when applicable) are obtained utilizing NASCET criteria, using the distal internal carotid diameter as the denominator. CONTRAST:  80mL OMNIPAQUE  IOHEXOL 350 MG/ML SOLN COMPARISON:  Head CT and brain MRI from yesterday FINDINGS: CTA NECK FINDINGS Aortic arch: Unremarkable Right carotid system: Mild atherosclerotic plaque at the bulb without flow limiting stenosis or ulceration. Left carotid system: Mild atherosclerotic plaque at the bulb without stenosis or ulceration. Vertebral arteries: No proximal subclavian stenosis or ulceration. Essentially codominant vertebral arteries with mild tortuosity but no beading, dissection, or stenosis. Skeleton: C6-7 ACDF with solid arthrodesis. Other neck: Left thyroid nodule/cyst measuring 18 mm, considered incidental. Upper chest: Biapical emphysema. Review of the MIP images confirms the above findings CTA HEAD FINDINGS Anterior circulation: No branch occlusion, flow limiting stenosis, or beading. There is an inferiorly projecting left supraclinoid ICA aneurysm measuring 6 mm. The sac is smooth. Posterior circulation: The vertebral and basilar arteries are smooth and widely patent. No branch occlusion or flow limiting stenosis. Negative for aneurysm Venous sinuses: Strong dominance of the right transverse sigmoid dural venous sinus. No thrombosis. Anatomic variants: Negative Delayed phase: Known acute left cerebellar infarct. No evidence of interval infarct since MRI yesterday. Review of the MIP images confirms the above  findings IMPRESSION: 1. Mild atherosclerosis without flow limiting stenosis or visible embolic source. 2. 6 mm left supraclinoid ICA aneurysm. Electronically Signed   By: Monte Fantasia M.D.   On: 07/28/2018 10:10     Ct Head Wo Contrast Result Date: 07/27/2018 CLINICAL DATA:  Focal neurologic deficit greater than 6 hours. Left upper extremity numbness and heaviness. EXAM: CT HEAD WITHOUT CONTRAST CT CERVICAL SPINE WITHOUT CONTRAST TECHNIQUE: Multidetector CT imaging of the head and cervical spine was performed following the standard protocol without intravenous contrast. Multiplanar CT image  reconstructions of the cervical spine were also generated. COMPARISON:  10/27/2010 head CT. FINDINGS: CT HEAD FINDINGS Brain: Rounded focus of cortical hypodensity in the anterior left cerebellar hemisphere with associated mild local sulcal effacement, new since 2012 head CT. Small focus of encephalomalacia in posteromedial right cerebellar hemisphere, favor remote infarct. No evidence of parenchymal hemorrhage or extra-axial fluid collection. No mass lesion, mass effect, or midline shift. Nonspecific mild subcortical and periventricular white matter hypodensity, most in keeping with chronic small vessel ischemic change. Cerebral volume is age appropriate. No ventriculomegaly. Vascular: No acute abnormality. Skull: No evidence of calvarial fracture. Sinuses/Orbits: The visualized paranasal sinuses are essentially clear. Other:  The mastoid air cells are unopacified. CT CERVICAL SPINE FINDINGS Alignment: Straightening of the cervical spine. No facet subluxation. Dens is well positioned between the lateral masses of C1. Minimal 2 mm retrolisthesis C5-6. Minimal 2 mm anterolisthesis C7-T1. Skull base and vertebrae: No acute fracture. No primary bone lesion or focal pathologic process. Soft tissues and spinal canal: No prevertebral edema. No visible canal hematoma. Disc levels: Status post ACDF at C6-7, with no evidence of hardware fracture or loosening. Bony fusion of C6-7 disc space. Moderate to marked multilevel degenerative disc disease in the mid to lower cervical spine, most prominent C5-6. Mild-to-moderate bilateral facet arthropathy. Moderate degenerative foraminal stenosis on the left at C5-6 and C6-7 predominantly due to uncovertebral hypertrophy. Mild-to-moderate degenerative foraminal stenosis on the right at C5-6 due to uncovertebral hypertrophy. Upper chest: No acute abnormality. Other: Visualized mastoid air cells appear clear. Bilateral hypodense thyroid nodules, largest 1.9 cm on the left. No  pathologically enlarged cervical nodes. IMPRESSION: 1. Rounded focus of cortical hypodensity in the anterior left cerebellar hemisphere with associated mild local sulcal effacement, new since 2012 head CT, most compatible with acute/early subacute left cerebellar infarct. 2. Small focus of encephalomalacia in the posteromedial right cerebellar hemisphere, favor remote infarct. 3. No acute intracranial hemorrhage. 4. Mild chronic small vessel ischemic changes in the cerebral white matter. 5. 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 ACDF C6-7 with no evidence of hardware complication. 6. Mild-to-moderate multilevel degenerative foraminal stenosis in the lower cervical spine as detailed. 7. Dominant 1.9 cm left thyroid lobe nodule. Thyroid ultrasound correlation warranted on a short term outpatient basis if not previously performed. This follows ACR consensus guidelines: Managing Incidental Thyroid Nodules Detected on Imaging: White Paper of the ACR Incidental Thyroid Findings Committee. J Am Coll Radiol 2015; 12:143-150. Critical Value/emergent results were called by telephone at the time of interpretation on 07/27/2018 at 2:20 pm to Dr. Varney Biles , who verbally acknowledged these results. Electronically Signed   By: Ilona Sorrel M.D.   On: 07/27/2018 14:29    Mr Brain Wo Contrast Result Date: 07/27/2018 CLINICAL DATA:  Left hand weakness for 4 days EXAM: MRI HEAD WITHOUT CONTRAST TECHNIQUE: Multiplanar, multiecho pulse sequences of the brain and surrounding structures were obtained without intravenous contrast. COMPARISON:  Head CT from earlier today FINDINGS: Brain: Patchy restricted diffusion in the left mid to upper cerebellum involving a moderate to large area. There have been remote bilateral smaller cerebellar infarcts. Small remote bilateral occipital and parietal cortically based infarcts. Mild chronic small vessel ischemic change in the cerebral white  matter. Brain volume is normal Focus of chronic blood products along the lateral right frontal lobe, nonspecific in isolation. No masslike finding or hydrocephalus. Vascular: Major flow voids are preserved Skull and upper cervical spine: Negative for marrow lesion Sinuses/Orbits: Negative IMPRESSION: 1. 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 2. Multiple small remote posterior circulation infarcts, suggest vascular imaging. Electronically Signed   By: Monte Fantasia M.D.   On: 07/27/2018 14:56     Vas [place] Lower Extremity Venous (dvt) Result Date: 07/28/2018  Lower Venous Study Indications: Stroke.  Comparison Study: No prior study on file for comparison Performing Technologist: Sharion Dove RVS  Examination Guidelines: A complete evaluation includes B-mode imaging, spectral Doppler, color Doppler, and power Doppler as needed of all accessible portions of each vessel. Bilateral testing is considered an integral part of a complete examination. Limited examinations for reoccurring indications may be performed as noted.  +---------+---------------+---------+-----------+----------+-------+  RIGHT     Compressibility Phasicity Spontaneity Properties Summary  +---------+---------------+---------+-----------+----------+-------+  CFV       Full            Yes       Yes                             +---------+---------------+---------+-----------+----------+-------+  SFJ       Full                                                      +---------+---------------+---------+-----------+----------+-------+  FV Prox   Full                                                      +---------+---------------+---------+-----------+----------+-------+  FV Mid    Full                                                      +---------+---------------+---------+-----------+----------+-------+  FV Distal Full                                                      +---------+---------------+---------+-----------+----------+-------+   PFV       Full                                                      +---------+---------------+---------+-----------+----------+-------+  POP       Full  Yes       Yes                             +---------+---------------+---------+-----------+----------+-------+  PTV       Full                                                      +---------+---------------+---------+-----------+----------+-------+  PERO      Full                                                      +---------+---------------+---------+-----------+----------+-------+   +---------+---------------+---------+-----------+----------+-------+  LEFT      Compressibility Phasicity Spontaneity Properties Summary  +---------+---------------+---------+-----------+----------+-------+  CFV       Full            Yes       Yes                             +---------+---------------+---------+-----------+----------+-------+  SFJ       Full                                                      +---------+---------------+---------+-----------+----------+-------+  FV Prox   Full                                                      +---------+---------------+---------+-----------+----------+-------+  FV Mid    Full                                                      +---------+---------------+---------+-----------+----------+-------+  FV Distal Full                                                      +---------+---------------+---------+-----------+----------+-------+  PFV       Full                                                      +---------+---------------+---------+-----------+----------+-------+  POP       Full            Yes       Yes                             +---------+---------------+---------+-----------+----------+-------+  PTV  Full                                                      +---------+---------------+---------+-----------+----------+-------+  PERO      Full                                                       +---------+---------------+---------+-----------+----------+-------+     *See table(s) above for measurements and observations.     Preliminary   (I have d/w vascular lab, this was verbally reported to me as negative)   12-lead ECG SR All prior EKG's in EPIC reviewed with no documented atrial fibrillation  Telemetry SR/ST  Assessment and Plan:  1. Cryptogenic stroke The patient presents with cryptogenic stroke.  I nd Dr. Lovena Le have spoken at length with the patient about monitoring for afib with either a 30 day event monitor or an implantable loop recorder.  Risks, benefits, and alteratives to implantable loop recorder were discussed with the patient today.   At this time,she is very clear in her decision to proceed with implantable loop recorder.   Wound care was reviewed with the patient (keep incision clean and dry for 3 days).  Wound check will be scheduled for the patient  Please call with questions.   Renee Dyane Dustman, PA-C 07/29/2018  EP Attending  Patient seen and examined. Agree with the findings as noted above. The patient has sustained a cryptogenic stroke. I have reviewed the indications/risks/benefits/goals/expectations of ILR insertion and she wishes to proceed.  Cristopher Peru, M.D.

## 2018-07-30 ENCOUNTER — Telehealth: Payer: Self-pay | Admitting: Adult Health

## 2018-07-30 ENCOUNTER — Other Ambulatory Visit: Payer: Self-pay | Admitting: Adult Health

## 2018-07-30 ENCOUNTER — Encounter (HOSPITAL_COMMUNITY): Payer: Self-pay | Admitting: Internal Medicine

## 2018-07-30 LAB — HIV ANTIBODY (ROUTINE TESTING W REFLEX): HIV Screen 4th Generation wRfx: NONREACTIVE

## 2018-07-30 NOTE — Telephone Encounter (Signed)
Copied from [place] [phone]. Topic: Quick Communication - Home Health Verbal Orders >> Jul 30, 2018 12:55 PM Valla Leaver wrote: Caller/Agency: Donita, RN, w/ [facility] Callback Number: [phone] Requesting OT/PT/Skilled Nursing/Social Work/Speech Therapy: Skilled nursing and PT eval Frequency: 1x a wk for 5wks (skilled nursing)

## 2018-07-30 NOTE — Telephone Encounter (Signed)
Ok for verbal orders ?

## 2018-07-30 NOTE — Telephone Encounter (Signed)
Donita notified to proceed with orders.  Nothing further needed.

## 2018-08-02 ENCOUNTER — Telehealth: Payer: Self-pay | Admitting: *Deleted

## 2018-08-02 ENCOUNTER — Telehealth: Payer: Self-pay | Admitting: Adult Health

## 2018-08-02 ENCOUNTER — Telehealth: Payer: Self-pay | Admitting: Internal Medicine

## 2018-08-02 NOTE — Telephone Encounter (Signed)
Spoke with patient and family member. Walked them through removing Tegaderm dressing. Steri-strips remain dry and intact place per family member. Slight "oozing" from ? superficial abrasion where Tegaderm had been. Advised can pat this area with damp washcloth, but avoid getting steri-strips wet. Pt and family member verbalize understanding. They agree to call back for any other wound questions or concerns, including bleeding from incision or signs/symptoms of infection. They deny additional questions or concerns at this time.

## 2018-08-02 NOTE — Telephone Encounter (Signed)
Copied from [place] [phone]. Topic: General - Other >> Aug 02, 2018  3:37 PM Leward Quan A wrote: Reason for CRM:  Candice with [facility] called to say that patient has asked to not have a visit today as she is tired and needed to rest. But they will be seeing her on 08/05/2018. Any questions please call Candice at Ph#  [phone]

## 2018-08-02 NOTE — Telephone Encounter (Signed)
Merry Proud notified to proceed with all orders.  Nothing further needed.

## 2018-08-02 NOTE — Telephone Encounter (Signed)
Copied from [place] [id number]. Topic: Quick Communication - Home Health Verbal Orders >> Aug 02, 2018  9:48 AM Pauline Good wrote: Caller/Agency: [facility] Number: [phone] Requesting OT/PT/Skilled Nursing/Social Work/Speech Therapy: PT Frequency: 1wk for 4 times

## 2018-08-02 NOTE — Telephone Encounter (Signed)
New Message    Pt said they told her she can take the bandage off Friday but it is stuck and she is worried pulling it off that it will open the wound   Please call

## 2018-08-02 NOTE — Telephone Encounter (Signed)
Ok for verbal orders ?

## 2018-08-05 ENCOUNTER — Telehealth: Payer: Self-pay | Admitting: Adult Health

## 2018-08-05 NOTE — Telephone Encounter (Signed)
Copied from [place] [id number]. Topic: Quick Communication - Home Health Verbal Orders >> Aug 05, 2018 11:43 AM Sheran Luz wrote: Caller/Agency: [place] Number: [phone] Requesting OT Frequency: 2w4

## 2018-08-05 NOTE — Telephone Encounter (Signed)
Brandon notified to proceed with OT.  Nothing further needed.

## 2018-08-12 ENCOUNTER — Ambulatory Visit (INDEPENDENT_AMBULATORY_CARE_PROVIDER_SITE_OTHER): Payer: Medicare Other | Admitting: Student

## 2018-08-12 ENCOUNTER — Other Ambulatory Visit: Payer: Self-pay

## 2018-08-12 DIAGNOSIS — I639 Cerebral infarction, unspecified: Secondary | ICD-10-CM

## 2018-08-12 LAB — CUP PACEART INCLINIC DEVICE CHECK
Date Time Interrogation Session: 20200518141330
Implantable Pulse Generator Implant Date: 20200504

## 2018-08-12 NOTE — Progress Notes (Signed)
ILR wound check in clinic. Steri strips removed. Wound well healed. Home monitor not transmitting. Pt states she has plugged in a separate room and has not activated. Given instructions on how to connect. Will place on nightstand.  No episodes. R wave 0.64 mV. Questions answered. Return to clinic as needed.    Legrand Como [address]" [place], PA-C 08/12/2018 2:14 PM

## 2018-08-14 ENCOUNTER — Other Ambulatory Visit: Payer: Self-pay

## 2018-08-14 ENCOUNTER — Ambulatory Visit (INDEPENDENT_AMBULATORY_CARE_PROVIDER_SITE_OTHER): Payer: Medicare Other | Admitting: Adult Health

## 2018-08-14 DIAGNOSIS — I10 Essential (primary) hypertension: Secondary | ICD-10-CM | POA: Diagnosis not present

## 2018-08-14 DIAGNOSIS — E041 Nontoxic single thyroid nodule: Secondary | ICD-10-CM | POA: Diagnosis not present

## 2018-08-14 DIAGNOSIS — Z8673 Personal history of transient ischemic attack (TIA), and cerebral infarction without residual deficits: Secondary | ICD-10-CM | POA: Diagnosis not present

## 2018-08-14 DIAGNOSIS — I639 Cerebral infarction, unspecified: Secondary | ICD-10-CM

## 2018-08-14 DIAGNOSIS — K219 Gastro-esophageal reflux disease without esophagitis: Secondary | ICD-10-CM | POA: Diagnosis not present

## 2018-08-15 ENCOUNTER — Encounter: Payer: Self-pay | Admitting: Adult Health

## 2018-08-15 MED ORDER — PANTOPRAZOLE SODIUM 40 MG PO TBEC: 40.0000 mg | DELAYED_RELEASE_TABLET | Freq: Every day | ORAL | 3 refills | Status: AC

## 2018-08-15 MED ORDER — CLOPIDOGREL BISULFATE 75 MG PO TABS: 75.0000 mg | ORAL_TABLET | Freq: Every day | ORAL | 3 refills | Status: AC

## 2018-08-15 NOTE — Progress Notes (Signed)
Virtual Visit via Telephone Note  I connected with Kelsey Sparks on 08/14/2018 at  3:00 PM EDT by telephone and verified that I am speaking with the correct person using two identifiers.   I discussed the limitations, risks, security and privacy concerns of performing an evaluation and management service by telephone and the availability of in person appointments. I also discussed with the patient that there may be a patient responsible charge related to this service. The patient expressed understanding and agreed to proceed.  Location patient: home Location provider: work or home office Participants present for the call: patient, provider, niece  Patient did not have a visit in the prior 7 days to address this/these issue(s).   History of Present Illness: 73 year old female who  has a past medical history of Adenomatous colon polyp, Allergy, Anemia, Arthritis, Constipation, Esophageal stricture, GERD (gastroesophageal reflux disease), Hiatal hernia, Hyperlipidemia, Hypertension, IBS (irritable bowel syndrome), Low back pain, Macular degeneration, Muscular degeneration, and Peripheral vascular disease ([place]).  She is being evaluated today for TCM visit   Admit Date: 07/27/2018 Discharge Date: 07/29/2018  She presented to [facility] long emergency room for evaluation of left-sided arm weakness started 4 days prior to initial evaluation.  Her CAT scan of the head and MRI of the brain confirmed acute CVA, overall her labs were unremarkable.  She was transferred to [facility].  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 Estanislado Pandy( Interventional Neuroradiology)  as outpatient  Left Thyroid Nodule  -Incidental Finding on CTA head/neck  GERD  -Switched to Protonix due to being on Plavix which interferes with Aciphex  Today she reports that she is "doing really good".  Her niece agrees with this statement.  She does report that home health PT/OT is coming into the house and she has noticed regaining strength in her left upper extremity.  Denies headaches, blurred vision, chest pain, shortness of breath, slurred speech, or facial droop.  She does report that her appetite is good.  She does need a refill of Plavix as well as Protonix.  She is aware of her follow-up in June with neurology  We discussed incidental endings of 6 mm aneurysm as well as thyroid nodule.  She and her niece report that they were never informed about this in the hospital.  Advised that Dr. Thea Silversmith was aware and she will be following up with him and we will get thyroid ultrasound as outpatient.   Observations/Objective: Patient sounds cheerful and well on the phone. I do not appreciate any SOB. Speech and thought processing are grossly intact. Patient reported vitals:  Assessment and Plan: 1. Acute CVA (cerebrovascular accident) [facility]) -We reviewed imaging, labs, and hospital notes.  All questions answered to the best of my ability.  She does report improvement since getting home, advised to continue to do exercises and follow direction of PT OT.  Advised to follow-up with outpatient appointments as directed. - clopidogrel (PLAVIX) 75 MG tablet; Take 1 tablet (75 mg total) by mouth daily.  Dispense: 90 tablet; Refill:  3  2. Essential hypertension -Continue to keep blood pressure normotensive.  Monitor blood pressure at home.  Advised to follow-up with blood pressure goes above 130/80 consistently.  3. Gastroesophageal reflux disease without  esophagitis  - pantoprazole (PROTONIX) 40 MG tablet; Take 1 tablet (40 mg total) by mouth daily.  Dispense: 90 tablet; Refill: 3  4. Thyroid nodule  - US THYROID; Future   Follow Up Instructions:  I did not refer this patient for an OV in the next 24 hours for this/these issue(s).  I discussed the assessment and treatment plan with the patient. The patient was provided an opportunity to ask questions and all were answered. The patient agreed with the plan and demonstrated an understanding of the instructions.   The patient was advised to call back or seek an in-person evaluation if the symptoms worsen or if the condition fails to improve as anticipated.  I provided 30 minutes of non-face-to-face time during this encounter.   Dorothyann Peng, NP

## 2018-08-20 ENCOUNTER — Telehealth: Payer: Self-pay

## 2018-08-20 NOTE — Telephone Encounter (Signed)
LMOVM for pt to call my direct office number. The pt is sending multiple unscheduled transmissions. Pt just needs education on when and when not to send.

## 2018-08-21 NOTE — Telephone Encounter (Signed)
I let the pt know she do not have to send everyday. I told her her monitor automatically sends. The only time she has to send is when she having an episode or when the nurse call to ask for one. The pt states she understands.

## 2018-09-02 ENCOUNTER — Ambulatory Visit (INDEPENDENT_AMBULATORY_CARE_PROVIDER_SITE_OTHER): Payer: Medicare Other | Admitting: *Deleted

## 2018-09-02 DIAGNOSIS — I639 Cerebral infarction, unspecified: Secondary | ICD-10-CM

## 2018-09-02 LAB — CUP PACEART REMOTE DEVICE CHECK
Date Time Interrogation Session: 20200606193910
Implantable Pulse Generator Implant Date: 20200504

## 2018-09-09 NOTE — Progress Notes (Signed)
Carelink Summary Report / Loop Recorder 

## 2018-09-11 ENCOUNTER — Encounter: Payer: Medicare Other | Attending: Physical Medicine & Rehabilitation | Admitting: Physical Medicine & Rehabilitation

## 2018-09-11 ENCOUNTER — Encounter: Payer: Self-pay | Admitting: Physical Medicine & Rehabilitation

## 2018-09-11 ENCOUNTER — Telehealth: Payer: Self-pay | Admitting: Physical Medicine & Rehabilitation

## 2018-09-11 ENCOUNTER — Other Ambulatory Visit: Payer: Self-pay

## 2018-09-11 VITALS — BP 118/78 | HR 73 | Temp 97.7°F | Ht 64.0 in | Wt 156.0 lb

## 2018-09-11 DIAGNOSIS — M5416 Radiculopathy, lumbar region: Secondary | ICD-10-CM | POA: Diagnosis not present

## 2018-09-11 DIAGNOSIS — M170 Bilateral primary osteoarthritis of knee: Secondary | ICD-10-CM | POA: Insufficient documentation

## 2018-09-11 DIAGNOSIS — Z8261 Family history of arthritis: Secondary | ICD-10-CM | POA: Insufficient documentation

## 2018-09-11 DIAGNOSIS — M1711 Unilateral primary osteoarthritis, right knee: Secondary | ICD-10-CM

## 2018-09-11 DIAGNOSIS — M25552 Pain in left hip: Secondary | ICD-10-CM | POA: Insufficient documentation

## 2018-09-11 DIAGNOSIS — Z8249 Family history of ischemic heart disease and other diseases of the circulatory system: Secondary | ICD-10-CM | POA: Insufficient documentation

## 2018-09-11 DIAGNOSIS — G8929 Other chronic pain: Secondary | ICD-10-CM | POA: Diagnosis not present

## 2018-09-11 DIAGNOSIS — Z803 Family history of malignant neoplasm of breast: Secondary | ICD-10-CM | POA: Insufficient documentation

## 2018-09-11 DIAGNOSIS — Z90710 Acquired absence of both cervix and uterus: Secondary | ICD-10-CM | POA: Diagnosis not present

## 2018-09-11 DIAGNOSIS — Z8719 Personal history of other diseases of the digestive system: Secondary | ICD-10-CM | POA: Insufficient documentation

## 2018-09-11 DIAGNOSIS — Z9889 Other specified postprocedural states: Secondary | ICD-10-CM | POA: Diagnosis not present

## 2018-09-11 DIAGNOSIS — R42 Dizziness and giddiness: Secondary | ICD-10-CM | POA: Diagnosis not present

## 2018-09-11 DIAGNOSIS — Z9049 Acquired absence of other specified parts of digestive tract: Secondary | ICD-10-CM | POA: Diagnosis not present

## 2018-09-11 DIAGNOSIS — Z87891 Personal history of nicotine dependence: Secondary | ICD-10-CM | POA: Insufficient documentation

## 2018-09-11 DIAGNOSIS — H3530 Unspecified macular degeneration: Secondary | ICD-10-CM | POA: Insufficient documentation

## 2018-09-11 NOTE — Telephone Encounter (Signed)
Forgot to put in previous message. Medication was placed in med room on top of the counter.

## 2018-09-11 NOTE — Telephone Encounter (Signed)
Patient left her medication in a plastic bag in room when she left. Bruce brought to front desk. We called and left a message for her to come pick up medication. I called again this afternoon. Spoke to patient. She stated she will pick up tomorrow.

## 2018-09-11 NOTE — Progress Notes (Signed)
Subjective:    Patient ID: Kelsey Sparks, female    DOB: 07/16/1945, 73 y.o.   MRN: [id number]  HPI   Kelsey Sparks is here in follow up of her chronic pain. She was admitted with a cerebellar infarct in May. She was discharged home with [facility] and has done well since then. She feels that she's back to her baseline. She is not using a cane or walker. She is now on plavix plus baby asa at present.   Her right knee has been acting up with the cooler weather as of late.   She is planning on moving to [place] near [place] at the end of the month to be closer to her family.     Pain Inventory Average Pain 7 Pain Right Now 7 My pain is sharp  In the last 24 hours, has pain interfered with the following? General activity 0 Relation with others 4 Enjoyment of life 0 What TIME of day is your pain at its worst? evening and night Sleep (in general) Good  Pain is worse with: inactivity and standing Pain improves with: medication Relief from Meds: 7  Mobility how many minutes can you walk? 5 ability to climb steps?  yes do you drive?  yes  Function retired  Neuro/Psych weakness tingling dizziness  Prior Studies CT/MRI  Physicians involved in your care hospital   Family History  Problem Relation Age of Onset  . Arthritis Father   . Hypertension Father   . Arthritis Mother   . Asthma Mother   . Breast cancer Maternal Aunt   . Colon cancer Neg Hx   . Colon polyps Neg Hx   . Rectal cancer Neg Hx   . Stomach cancer Neg Hx    Social History   Socioeconomic History  . Marital status: Married    Spouse name: Not on file  . Number of children: Not on file  . Years of education: Not on file  . Highest education level: Not on file  Occupational History  . Not on file  Social Needs  . Financial resource strain: Not on file  . Food insecurity    Worry: Not on file    Inability: Not on file  . Transportation needs    Medical: Not on file    Non-medical: Not on  file  Tobacco Use  . Smoking status: Former Smoker    Packs/day: 0.50    Years: 25.00    Pack years: 12.50    Quit date: 03/27/2008    Years since quitting: 10.4  . Smokeless tobacco: Never Used  Substance and Sexual Activity  . Alcohol use: No  . Drug use: No  . Sexual activity: Not on file  Lifestyle  . Physical activity    Days per week: Not on file    Minutes per session: Not on file  . Stress: Not on file  Relationships  . Social Herbalist on phone: Not on file    Gets together: Not on file    Attends religious service: Not on file    Active member of club or organization: Not on file    Attends meetings of clubs or organizations: Not on file    Relationship status: Not on file  Other Topics Concern  . Not on file  Social History Narrative  . Not on file   Past Surgical History:  Procedure Laterality Date  . BACK SURGERY    . CHOLECYSTECTOMY    .  COLONOSCOPY    . KNEE SURGERY    . LOOP RECORDER INSERTION N/A 07/29/2018   Procedure: LOOP RECORDER INSERTION;  Surgeon: Evans Lance, MD;  Location: [facility] [facility];  Service: Cardiovascular;  Laterality: N/A;  . LUMBAR Kim    . NECK SURGERY    . POLYPECTOMY    . right small toe surgery    . TOTAL ABDOMINAL HYSTERECTOMY W/ BILATERAL SALPINGOOPHORECTOMY    . UPPER GASTROINTESTINAL ENDOSCOPY     Past Medical History:  Diagnosis Date  . Adenomatous colon polyp   . Allergy   . Anemia   . Arthritis   . Constipation    pt uses metamucil  . Esophageal stricture   . GERD (gastroesophageal reflux disease)   . Hiatal hernia   . Hyperlipidemia   . Hypertension   . IBS (irritable bowel syndrome)   . Low back pain   . Macular degeneration   . Muscular degeneration   . Peripheral vascular disease ([facility])    BP 118/78   Pulse 73   Temp 97.7 F (36.5 C)   Ht 5\' 4"  (1.626 m)   Wt 156 lb (70.8 kg)   SpO2 95%   BMI 26.78 kg/m   Opioid Risk Score:   Fall Risk Score:  `1  Depression screen  PHQ 2/9  Depression screen [facility] 2/9 12/18/2017 08/13/2017 12/13/2016 11/06/2016 03/15/2016 11/24/2015 02/24/2015  Decreased Interest 0 0 0 0 0 0 2  Down, Depressed, Hopeless 0 0 0 0 0 0 0  PHQ - 2 Score 0 0 0 0 0 0 2  Altered sleeping - - - - - - 1  Tired, decreased energy - - - - - - 2  Change in appetite - - - - - - 0  Feeling bad or failure about yourself  - - - - - - 0  Trouble concentrating - - - - - - 0  Moving slowly or fidgety/restless - - - - - - 1  Suicidal thoughts - - - - - - 0  PHQ-9 Score - - - - - - 6  Difficult doing work/chores - - - - - - Not difficult at all  Some recent data might be hidden    Review of Systems  Constitutional: Positive for diaphoresis and unexpected weight change.  HENT: Negative.   Eyes: Negative.   Respiratory: Negative.   Cardiovascular: Negative.   Gastrointestinal: Positive for constipation.  Endocrine: Negative.   Genitourinary: Negative.   Musculoskeletal: Negative.   Skin: Negative.   Allergic/Immunologic: Negative.   Neurological: Positive for dizziness.       Tingling  Hematological: Negative.   Psychiatric/Behavioral: Negative.   All other systems reviewed and are negative.      Objective:   Physical Exam  General: No acute distress HEENT: EOMI, oral membranes moist Cards: reg rate  Chest: normal effort Abdomen: Soft, NT, ND Skin: dry, intact Extremities: no edema Musculoskeletal: Right knee with ongoingeffusion. gait is stable.  LB mildly. mild right knee tenderness around the joint line with associated mild effusion Neurological:AxO x 3.motor 5/5. Cognition normal. No ataxia Skin:intact Psychiatric: Pleasant as always        Assessment & Plan:  1. 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 No RF today -local efforts to right knee including ice, compression       -knee injection  prn      We will continue the controlled substance monitoring  program, this consists of regular clinic visits, examinations, routine drug screening, pill counts as well as use of [place] Controlled Substance Reporting System. [place].    2. Right Lumbar Radiculitis/spondylosis: Continue Gabapentinwhich was refilled today.-continue withPilates stretches and exercises today 3. Recent cerebellar infarct  -plavix + asa until August   F/u PRN since she's moving. Fifteen minutes of face to face patient care time were spent during this visit. All questions were encouraged and answered.

## 2018-09-17 ENCOUNTER — Other Ambulatory Visit: Payer: Self-pay

## 2018-09-17 ENCOUNTER — Encounter: Payer: Self-pay | Admitting: Adult Health

## 2018-09-17 ENCOUNTER — Ambulatory Visit: Payer: Medicare Other | Admitting: Adult Health

## 2018-09-17 VITALS — BP 115/69 | HR 80 | Temp 98.1°F | Wt 163.0 lb

## 2018-09-17 DIAGNOSIS — E785 Hyperlipidemia, unspecified: Secondary | ICD-10-CM

## 2018-09-17 DIAGNOSIS — I6349 Cerebral infarction due to embolism of other cerebral artery: Secondary | ICD-10-CM | POA: Diagnosis not present

## 2018-09-17 DIAGNOSIS — Z9889 Other specified postprocedural states: Secondary | ICD-10-CM

## 2018-09-17 DIAGNOSIS — I10 Essential (primary) hypertension: Secondary | ICD-10-CM

## 2018-09-17 DIAGNOSIS — I720 Aneurysm of carotid artery: Secondary | ICD-10-CM

## 2018-09-17 NOTE — Progress Notes (Signed)
[facility] [address] [place]. [place] [address] [phone]       OFFICE FOLLOW UP NOTE  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 History obtained from patient and chart review. Reviewed all radiology images and labs personally.  [name] is a 73 y.o. female with history of HTN, HLD,migraines,ASPVD, vertigo and trigeminal neuralgia presented to [facility] for C/o left arm weakness. She did not receive IV t-PA due to late presentation (>4.5 hours from time of onset). Stroke work-up revealed left MCA territory scattered infarcts concerning for embolic pattern given chronic asymptomatic bilateral infarcts on MRI.  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  Loop recorder placed to rule out atrial fibrillation.  Recommended DAPT for 3 weeks and Plavix alone.  HTN stable.  Recommended follow-up with Dr. Estanislado Pandy for ICA aneurysm monitoring.  Other stroke risk factors include advanced age, former tobacco use, migraines, PVD and prior strokes.  Discharged home in stable condition.  Stroke: left SCA territory scattered infarcts concerning for embolic patten given chronic asymptomatic bilateral infarcts on MRI  CT head - Rounded focus of cortical hypodensity in the anterior left cerebellar hemisphere with associated mild local sulcal effacement, new since 2012 head CT, most compatible with acute/early subacute left cerebellar infarct. Small focus of encephalomalacia in the posteromedial right cerebellar hemisphere, favor remote infarct.   MRI head - Moderate acute infarction of the left cerebellum.  Multiple small remote posterior circulation infarcts  CTA H&N unremarkable except 6 mm left supraclinoid ICA aneurysm  LE Venous Dopplers no DVT  2D Echo EF more than 65%, no PFO  loop recorder to rule out A. Fib placed 5.4.2020  LDL - pending  HgbA1c - pending  VTE prophylaxis - Lovenox  aspirin 81 mg daily prior to admission, now on aspirin 81 and Plavix 75.  Continue DAPT for 3 weeks and then Plavix alone.  Therapy recommendations:  Outpt OT  Disposition:  Return home  [place] for d/c from stroke standpoint  Follow-up Stroke Clinic at [facility] in 4 weeks. Office will call with appointment date and time. Order placed.   Residual mild left hand weakness but does endorse improvement.  She was initially participating in home OT but this is been completed due to overall improvement.  She continues on aspirin and Plavix as she was advised by her PCP to continue DAPT for 3 months.  Denies bleeding or bruising.  Continues on simvastatin without side effects myalgias.  Blood pressure stable at today's visit 115/69.  Loop recorder has not shown atrial fibrillation thus far.  She does plan on moving with her sister to [place] at the end of this week and is unsure if she will return to [place] for follow-up visits.  She has not followed up with vascular surgery at this time as recommended.  No further concerns at this time.  Denies new or worsening stroke/TIA symptoms.    ROS:   14 system review of systems performed and negative with exception of weakness  PMH:  Past Medical History:  Diagnosis Date  Adenomatous colon polyp    Allergy    Anemia    Arthritis    Constipation    pt uses metamucil   Esophageal stricture    GERD (gastroesophageal reflux disease)    Hiatal hernia    Hyperlipidemia    Hypertension    IBS (irritable bowel syndrome)    Low back pain    Macular degeneration    Muscular degeneration    Peripheral  vascular disease (HCC)     PSH:  Past Surgical History:  Procedure Laterality Date   BACK SURGERY     CHOLECYSTECTOMY     COLONOSCOPY     KNEE SURGERY     LOOP RECORDER INSERTION N/A 07/29/2018   Procedure: LOOP RECORDER INSERTION;  Surgeon: Evans Lance, MD;  Location: [facility];  Service: Cardiovascular;  Laterality: N/A;   LUMBAR DISC SURGERY     NECK SURGERY     POLYPECTOMY     right small toe surgery     TOTAL ABDOMINAL HYSTERECTOMY W/ BILATERAL SALPINGOOPHORECTOMY     UPPER GASTROINTESTINAL ENDOSCOPY      Social History:  Social History   Socioeconomic History   Marital status: Married    Spouse name: Not on file   Number of children: Not on file   Years of education: Not on file   Highest education level: Not on file  Occupational History   Not on file  Social Needs   Financial resource strain: Not on file   Food insecurity    Worry: Not on file    Inability: Not on file   Transportation needs    Medical: Not on file    Non-medical: Not on file  Tobacco Use   Smoking status: Former Smoker    Packs/day: 0.50    Years: 25.00    Pack years: 12.50    Quit date: 03/27/2008    Years since quitting: 10.4   Smokeless tobacco: Never Used  Substance and Sexual Activity   Alcohol use: No   Drug use: No   Sexual activity: Not on file  Lifestyle   Physical activity    Days per week: Not on file    Minutes per session: Not on file   Stress: Not on file  Relationships   Social connections    Talks on phone: Not on file    Gets together: Not on file    Attends religious service: Not on file    Active member of club or organization: Not on file    Attends meetings of clubs or organizations: Not on file    Relationship status: Not on file   Intimate partner violence    Fear of current or ex partner: Not on file    Emotionally abused: Not on file    Physically abused: Not on file    Forced sexual activity: Not on file  Other  Topics Concern   Not on file  Social History Narrative   Not on file    Family History:  Family History  Problem Relation Age of Onset   Arthritis Father    Hypertension Father    Arthritis Mother    Asthma Mother    Breast cancer Maternal Aunt    Colon cancer Neg Hx    Colon polyps Neg Hx    Rectal cancer Neg Hx    Stomach cancer Neg Hx     Medications:   Current Outpatient Medications on File Prior to Visit  Medication Sig  Dispense Refill   atenolol (TENORMIN) 50 MG tablet TAKE 1 TABLET(50 MG) BY MOUTH EVERY MORNING (Patient taking differently: Take 50 mg by mouth daily. ) 90 tablet 1   baclofen (LIORESAL) 10 MG tablet Take 10 mg by mouth 2 (two) times daily as needed (headache). Max 2 treatments per week     cetirizine (ZYRTEC) 10 MG tablet Take 10 mg by mouth daily.     clopidogrel (PLAVIX) 75 MG tablet Take 1 tablet (75 mg total) by mouth daily. 90 tablet 3   conjugated estrogens (PREMARIN) vaginal cream 0.5 applicatorful vaginally twice weekly. (Patient taking differently: Place 0.5 Applicatorfuls vaginally See admin instructions. Apply 1/2 applicatorful vaginally up to twice weekly as needed for dryness) 30 g 0   fluticasone (FLONASE) 50 MCG/ACT nasal spray instill 2 sprays into each nostril once daily (Patient taking differently: Place 2 sprays into both nostrils daily. ) 48 g 1   gabapentin (NEURONTIN) 100 MG capsule Take 1 capsule (100 mg total) by mouth 3 (three) times daily. (Patient taking differently: Take 300 mg by mouth daily. ) 270 capsule 2   hydrocortisone (ANUSOL-HC) 25 MG suppository Place 1 suppository (25 mg total) rectally 2 (two) times daily as needed for hemorrhoids or itching. 30 suppository 6   meclizine (ANTIVERT) 25 MG tablet TAKE 1/2 TO 1 TABLET EVERY 6 HOURS IF NEEDED (Patient taking differently: Take 12.5 mg by mouth every 6 (six) hours as needed for dizziness (vertigo). ) 40 tablet 0   pantoprazole (PROTONIX) 40 MG tablet Take 1  tablet (40 mg total) by mouth daily. 90 tablet 3   Polyethyl Glycol-Propyl Glycol (SYSTANE) 0.4-0.3 % SOLN Apply 2 drops to eye 2 (two) times daily. (Patient taking differently: Place 2 drops into both eyes daily. ) 4 mL 3   simvastatin (ZOCOR) 40 MG tablet Take 1 tablet (40 mg total) by mouth daily. 90 tablet 1   triamterene-hydrochlorothiazide (MAXZIDE-25) 37.5-25 MG tablet Take 1 tablet by mouth every morning. (Patient taking differently: Take 1 tablet by mouth daily. ) 90 tablet 1   UNABLE TO FIND Out patient occupational therapy 1 each 0   VOLTAREN 1 % GEL Apply 2 g topically 4 (four) times daily. (Patient taking differently: Apply 2 g topically 4 (four) times daily as needed (arthritis pain). ) 3 Tube 6   zonisamide (ZONEGRAN) 50 MG capsule Take 50 mg by mouth at bedtime. Increase as directed; for Migraines  0   [DISCONTINUED] ferrous sulfate 325 (65 FE) MG tablet Take 325 mg by mouth daily with breakfast.       No current facility-administered medications on file prior to visit.     Allergies:   Allergies  Allergen Reactions   Atorvastatin Hives     Physical Exam  Vitals:   09/17/18 1411  BP: 115/69  Pulse: 80  Temp: 98.1 F (36.7 C)  Weight: 163 lb (73.9 kg)   Body mass index is 27.98 kg/m. No exam data present  Depression screen [facility] 2/9 09/17/2018  Decreased Interest 0  Down, Depressed, Hopeless 0  PHQ - 2 Score 0  Altered sleeping -  Tired, decreased energy -  Change in appetite -  Feeling bad or failure about yourself  -  Trouble concentrating -  Moving slowly or fidgety/restless -  Suicidal thoughts -  PHQ-9 Score -  Difficult doing work/chores -  Some recent data might be hidden     General: well developed, well nourished,  pleasant elderly African-American female, seated, in no evident  distress Head: head normocephalic and atraumatic.   Neck: supple with no carotid or supraclavicular bruits Cardiovascular: regular rate and rhythm, no  murmurs Musculoskeletal: no deformity Skin:  no rash/petichiae Vascular:  Normal pulses all extremities   Neurologic Exam Mental Status: Awake and fully alert. Oriented to place and time. Recent and remote memory intact. Attention span, concentration and fund of knowledge appropriate. Mood and affect appropriate.  Cranial Nerves: Fundoscopic exam reveals sharp disc margins. Pupils equal, briskly reactive to light. Extraocular movements full without nystagmus. Visual fields full to confrontation. Hearing intact. Facial sensation intact. Face, tongue, palate moves normally and symmetrically.  Motor: Normal bulk and tone.  Strength throughout all tested extremities except for mild left hand dexterity weakness Sensory.: intact to touch , pinprick , position and vibratory sensation.  Coordination: Rapid alternating movements normal in all extremities except diminished in left hand. Finger-to-nose and heel-to-shin performed accurately bilaterally. Gait and Station: Arises from chair without difficulty. Stance is normal. Gait demonstrates normal stride length and balance Reflexes: 1+ and symmetric. Toes downgoing.     NIHSS  0 Modified Rankin  2    Diagnostic Data (Labs, Imaging, Testing)  Ct Head Wo Contrast 07/27/2018 IMPRESSION:  1. Rounded focus of cortical hypodensity in the anterior left cerebellar hemisphere with associated mild local sulcal effacement, new since 2012 head CT, most compatible with acute/early subacute left cerebellar infarct.  2. Small focus of encephalomalacia in the posteromedial right cerebellar hemisphere, favor remote infarct.  3. No acute intracranial hemorrhage.  4. Mild chronic small vessel ischemic changes in the cerebral white matter.  5. 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 ACDF C6-7 with no evidence of hardware complication.  6. Mild-to-moderate multilevel degenerative foraminal stenosis in the lower  cervical spine as detailed.  7. Dominant 1.9 cm left thyroid lobe nodule. Thyroid ultrasound correlation warranted on a short term outpatient basis if not previously performed.   Ct Cervical Spine Wo Contrast 07/27/2018 IMPRESSION:    Moderate-to-marked multilevel degenerative disc disease in the mid to lower cervical spine. ACDF C6-7 with no evidence of hardware complication.   Mild-to-moderate multilevel degenerative foraminal stenosis in the lower cervical spine as detailed.   Ct Angio Head W Or Wo Contrast Ct Angio Neck W Or Wo Contrast Result Date: 07/28/2018 1. Mild atherosclerosis without flow limiting stenosis or visible embolic source. 2. 6 mm left supraclinoid ICA aneurysm. Electronically Signed   By: Monte Fantasia M.D.   On: 07/28/2018 10:10   Mr Brain Wo Contrast 07/27/2018 IMPRESSION:  1. 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  2. Multiple small remote posterior circulation infarcts, suggest vascular imaging.   EKG - SR rate 78 BPM. (See cardiology reading for complete details)     ASSESSMENT: Kelsey Sparks is a 73 y.o. year old female here with left SCA territory scattered infarcts concerning for embolic pattern given chronic asymptomatic bilateral infarcts on MRI on 07/27/2018.  Loop recorder placed to assess for atrial fibrillation which has not shown atrial fibrillation thus far.  Vascular risk factors include HTN, HLD, prior strokes, migraines, left ICA aneurysm and PVD.  She has recovered well from a stroke standpoint with residual mild left hand weakness.    PLAN:  1. Left SCA infarct: Continue clopidogrel 75 mg daily  and simvastatin for secondary stroke prevention.  Advised to discontinue aspirin 81 mg as recommended 3-week DAPT duration as no large vessel stenosis found.  Discussed with patient no indication for prolonged  DAPT as this increases risk for hemorrhage and does not reduce risk of recurrent stroke.  Continue to monitor loop recorder for  atrial fibrillation.  Maintain strict control of hypertension with blood pressure goal below 130/90, diabetes with hemoglobin A1c goal below 6.5% and cholesterol with LDL cholesterol (bad cholesterol) goal below 70 mg/dL.  I also advised the patient to eat a healthy diet with plenty of whole grains, cereals, fruits and vegetables, exercise regularly with at least 30 minutes of continuous activity daily and maintain ideal body weight. 2. HTN: Advised to continue current treatment regimen.  Today's BP 115/69.  Advised to continue to monitor at home along with continued follow-up with PCP for management 3. HLD: Advised to continue current treatment regimen along with continued follow-up with PCP for future prescribing and monitoring of lipid panel 4. Left ICA aneurysm: Advised importance of follow-up with Dr. Estanislado Pandy as recommended for ongoing surveillance monitoring at least until she is able to establish care with vascular surgery when she moves 5. Interested in [place] trial and she was provided with additional information from research team    Follow up in 6 months or call earlier if needed   Greater than 50% of time during this 30 minute visit was spent on counseling, explanation of diagnosis of left SCA infarct, reviewing risk factor management of HTN, HLD, planning of further management along with potential future management, and discussion with patient and family answering all questions. Updated sister on plan and answered all questions to patient and sister satisfaction.    Venancio Poisson, AGNP-BC  [facility] [address] [place], [place] [address]  Phone [phone] Fax [phone] Note: This document was prepared with digital dictation and possible smart phrase technology. Any transcriptional errors that result from this process are unintentional.

## 2018-09-17 NOTE — Patient Instructions (Addendum)
Continue clopidogrel 75 mg daily  and simvastatin for secondary stroke prevention  Continue to follow up with PCP regarding cholesterol and blood pressure management   Continue to do exercises at home for ongoing improvement of residual left hand weakness  Continue to monitor loop recorder for atrial fibrillation  Continue to monitor blood pressure at home  Maintain strict control of hypertension with blood pressure goal below 130/90, diabetes with hemoglobin A1c goal below 6.5% and cholesterol with LDL cholesterol (bad cholesterol) goal below 70 mg/dL. I also advised the patient to eat a healthy diet with plenty of whole grains, cereals, fruits and vegetables, exercise regularly and maintain ideal body weight.  Followup in the future with me in 6 months or call earlier if needed        Thank you for coming to see [place] at [facility]. I hope we have been able to provide you high quality care today.  You may receive a patient satisfaction survey over the next few weeks. We would appreciate your feedback and comments so that we may continue to improve ourselves and the health of our patients.

## 2018-09-18 ENCOUNTER — Encounter: Payer: Self-pay | Admitting: Podiatry

## 2018-09-18 ENCOUNTER — Ambulatory Visit: Payer: Medicare Other | Admitting: Podiatry

## 2018-09-18 ENCOUNTER — Other Ambulatory Visit: Payer: Self-pay

## 2018-09-18 DIAGNOSIS — M79674 Pain in right toe(s): Secondary | ICD-10-CM

## 2018-09-18 DIAGNOSIS — B351 Tinea unguium: Secondary | ICD-10-CM

## 2018-09-18 DIAGNOSIS — M79675 Pain in left toe(s): Secondary | ICD-10-CM

## 2018-09-18 NOTE — Progress Notes (Signed)
Patient ID: Kelsey Sparks, female   DOB: 1945/11/16, 73 y.o.   MRN: [id number] Complaint:  Visit Type: Patient returns to my office for continued preventative foot care services. Complaint: Patient states" my nails have grown long and thick and become painful to walk and wear shoes" . The patient presents for preventative foot care services. No changes to ROS.    Podiatric Exam: Vascular: dorsalis pedis and posterior tibial pulses are palpable bilateral. Capillary return is immediate. Temperature gradient is WNL. Skin turgor WNL  Sensorium: Normal Semmes Weinstein monofilament test. Normal tactile sensation bilaterally. Nail Exam: Pt has thick disfigured discolored nails with subungual debris noted bilateral entire nail hallux through fifth toenails with the exception fourth toenail left foot. Ulcer Exam: There is no evidence of ulcer or pre-ulcerative changes or infection. Orthopedic Exam: Muscle tone and strength are WNL. No limitations in general ROM. No crepitus or effusions noted. Foot type and digits show no abnormalities. HAV  B/L. Skin: No Porokeratosis. No infection or ulcers  Diagnosis:  Onychomycosis, , Pain in right toe, pain in left toes  Treatment & Plan Procedures and Treatment: Consent by patient was obtained for treatment procedures. The patient understood the discussion of treatment and procedures well. All questions were answered thoroughly reviewed. Debridement of mycotic and hypertrophic toenails, 1 through 5 bilateral and clearing of subungual debris. No ulceration, no infection noted. Return Visit-Office Procedure: Patient instructed to return to the office for a follow up visit 3 months for continued evaluation and treatment.    Gardiner Barefoot DPM

## 2018-09-19 ENCOUNTER — Ambulatory Visit
Admission: RE | Admit: 2018-09-19 | Discharge: 2018-09-19 | Disposition: A | Discharge status: Home / Self Care | Payer: Medicare Other | Source: Ambulatory Visit | Attending: Adult Health | Admitting: Adult Health

## 2018-09-19 ENCOUNTER — Telehealth (HOSPITAL_COMMUNITY): Payer: Self-pay

## 2018-09-19 DIAGNOSIS — E041 Nontoxic single thyroid nodule: Secondary | ICD-10-CM

## 2018-09-19 NOTE — Telephone Encounter (Signed)
Called to schedule angio, no answer, left vm. AW  

## 2018-09-19 NOTE — Progress Notes (Signed)
I agree with the above plan 

## 2018-10-02 ENCOUNTER — Telehealth: Payer: Self-pay | Admitting: Family Medicine

## 2018-10-02 NOTE — Telephone Encounter (Signed)
Copied from [place] [id number]. Topic: General - Other >> Oct 02, 2018  2:50 PM Wynetta Emery, [place] C wrote: Reason for CRM: pt moved to [place], pt would like to know if PCP could suggest a new PCP to her that is there in the area?    CB: [phone]

## 2018-10-02 NOTE — Telephone Encounter (Signed)
Pt notified to contact her insurance company to find out which providers are covered under her plan.  Nothing further needed at this time.

## 2018-10-04 ENCOUNTER — Ambulatory Visit (INDEPENDENT_AMBULATORY_CARE_PROVIDER_SITE_OTHER): Payer: Medicare Other | Admitting: *Deleted

## 2018-10-04 DIAGNOSIS — I639 Cerebral infarction, unspecified: Secondary | ICD-10-CM

## 2018-10-04 LAB — CUP PACEART REMOTE DEVICE CHECK
Date Time Interrogation Session: 20200709194115
Implantable Pulse Generator Implant Date: 20200504

## 2018-10-08 NOTE — Progress Notes (Signed)
Carelink Summary Report / Loop Recorder 

## 2018-10-15 ENCOUNTER — Telehealth (HOSPITAL_COMMUNITY): Payer: Self-pay

## 2018-10-15 NOTE — Telephone Encounter (Signed)
Called to schedule angio, no answer, left vm. AW  

## 2018-10-18 ENCOUNTER — Other Ambulatory Visit: Payer: Self-pay | Admitting: Adult Health

## 2018-10-18 DIAGNOSIS — I10 Essential (primary) hypertension: Secondary | ICD-10-CM

## 2018-10-18 MED ORDER — ATENOLOL 50 MG PO TABS: ORAL_TABLET | ORAL | 1 refills | Status: AC

## 2018-10-24 ENCOUNTER — Encounter: Payer: Medicare Other | Admitting: Vascular Surgery

## 2018-10-24 ENCOUNTER — Encounter: Payer: Self-pay | Admitting: Family

## 2018-10-25 ENCOUNTER — Other Ambulatory Visit: Payer: Self-pay | Admitting: Physical Medicine & Rehabilitation

## 2018-10-25 DIAGNOSIS — M4716 Other spondylosis with myelopathy, lumbar region: Secondary | ICD-10-CM

## 2018-10-25 DIAGNOSIS — M5416 Radiculopathy, lumbar region: Secondary | ICD-10-CM

## 2018-10-25 DIAGNOSIS — M1712 Unilateral primary osteoarthritis, left knee: Secondary | ICD-10-CM

## 2018-11-01 ENCOUNTER — Encounter: Payer: Self-pay | Admitting: Family Medicine

## 2018-11-04 ENCOUNTER — Telehealth: Payer: Self-pay | Admitting: Adult Health

## 2018-11-04 NOTE — Telephone Encounter (Signed)
I called pt and she moved 09-24-18 to [place], [place] but since no neurologist there will be going to one in [place], [place] on the 11-14-18.  That is why she missed vascular appt.  I relayed that she was being seen by the vascular practice for the 94mm ICA aneurysm for evaluation and monitoring.  She verbalized understanding.

## 2018-11-04 NOTE — Telephone Encounter (Signed)
Patient NO- Showed her apt .  [facility] tried to call her back again and scheduled patient relayed she was not coming because did not under why she needed to go .   Nicole Kindred . [phone] .    Patient would like a call why she needs to go.  If payments decides she wants to go she will have to call [phone].

## 2018-11-04 NOTE — Telephone Encounter (Signed)
It is recommended that she follow-up with vascular surgery for further evaluation/monitoring of 6 mm left supraclinoid ICA aneurysm which was found on imaging during hospitalization and also discussed at prior visit.  It will be highly recommended for her to reschedule this visit for ongoing monitoring

## 2018-11-06 ENCOUNTER — Ambulatory Visit (INDEPENDENT_AMBULATORY_CARE_PROVIDER_SITE_OTHER): Payer: Medicare Other | Admitting: *Deleted

## 2018-11-06 DIAGNOSIS — I639 Cerebral infarction, unspecified: Secondary | ICD-10-CM

## 2018-11-06 LAB — CUP PACEART REMOTE DEVICE CHECK
Date Time Interrogation Session: 20200811200840
Implantable Pulse Generator Implant Date: 20200504

## 2018-11-14 NOTE — Progress Notes (Signed)
Carelink Summary Report / Loop Recorder 

## 2018-12-09 ENCOUNTER — Ambulatory Visit (INDEPENDENT_AMBULATORY_CARE_PROVIDER_SITE_OTHER): Payer: Medicare Other | Admitting: *Deleted

## 2018-12-09 DIAGNOSIS — I639 Cerebral infarction, unspecified: Secondary | ICD-10-CM | POA: Diagnosis not present

## 2018-12-09 LAB — CUP PACEART REMOTE DEVICE CHECK
Date Time Interrogation Session: 20200913201027
Implantable Pulse Generator Implant Date: 20200504

## 2018-12-11 ENCOUNTER — Encounter: Payer: Medicare Other | Admitting: Vascular Surgery

## 2018-12-23 NOTE — Progress Notes (Signed)
Carelink Summary Report / Loop Recorder 

## 2019-01-10 ENCOUNTER — Ambulatory Visit (INDEPENDENT_AMBULATORY_CARE_PROVIDER_SITE_OTHER): Payer: Medicare Other | Admitting: *Deleted

## 2019-01-10 DIAGNOSIS — I639 Cerebral infarction, unspecified: Secondary | ICD-10-CM | POA: Diagnosis not present

## 2019-01-12 LAB — CUP PACEART REMOTE DEVICE CHECK
Date Time Interrogation Session: 20201016201337
Implantable Pulse Generator Implant Date: 20200504

## 2019-01-15 NOTE — Progress Notes (Signed)
Carelink Summary Report / Loop Recorder 

## 2019-02-12 ENCOUNTER — Ambulatory Visit (INDEPENDENT_AMBULATORY_CARE_PROVIDER_SITE_OTHER): Payer: Medicare Other | Admitting: *Deleted

## 2019-02-12 DIAGNOSIS — I639 Cerebral infarction, unspecified: Secondary | ICD-10-CM

## 2019-02-13 LAB — CUP PACEART REMOTE DEVICE CHECK
Date Time Interrogation Session: 20201118151759
Implantable Pulse Generator Implant Date: 20200504

## 2019-03-13 NOTE — Progress Notes (Signed)
Carelink Summary Report / Loop Recorder 

## 2019-03-19 ENCOUNTER — Other Ambulatory Visit: Payer: Self-pay | Admitting: Adult Health

## 2019-03-20 NOTE — Telephone Encounter (Signed)
Denied.  Pt is no longer taking. 

## 2019-05-21 ENCOUNTER — Telehealth: Payer: Self-pay | Admitting: *Deleted

## 2019-05-21 NOTE — Telephone Encounter (Signed)
Called to cancel her vaccine appointment for [facility] as she has moved to another county. No appointment on her chart.

## 2019-05-31 ENCOUNTER — Other Ambulatory Visit: Payer: Self-pay | Admitting: Adult Health

## 2019-05-31 DIAGNOSIS — I10 Essential (primary) hypertension: Secondary | ICD-10-CM

## 2019-06-04 NOTE — Telephone Encounter (Signed)
DENIED.  NEEDS YEARLY PHYSICAL.

## 2019-07-23 DIAGNOSIS — I729 Aneurysm of unspecified site: Secondary | ICD-10-CM | POA: Diagnosis not present

## 2019-07-23 DIAGNOSIS — G43009 Migraine without aura, not intractable, without status migrainosus: Secondary | ICD-10-CM | POA: Diagnosis not present

## 2019-07-23 DIAGNOSIS — Z8673 Personal history of transient ischemic attack (TIA), and cerebral infarction without residual deficits: Secondary | ICD-10-CM | POA: Diagnosis not present

## 2019-08-01 ENCOUNTER — Other Ambulatory Visit: Payer: Self-pay | Admitting: Adult Health

## 2019-08-01 NOTE — Telephone Encounter (Signed)
Sent to the pharmacy by e-scribe. 

## 2019-12-14 ENCOUNTER — Other Ambulatory Visit: Payer: Self-pay | Admitting: Adult Health

## 2019-12-14 DIAGNOSIS — I10 Essential (primary) hypertension: Secondary | ICD-10-CM

## 2020-07-13 IMAGING — MR MRI HEAD WITHOUT CONTRAST
10 series · 36 of 48 positions shown · non-contrast
Comparison: Head CT from earlier today

CLINICAL DATA: Left hand weakness for 4 days

EXAM:
MRI HEAD WITHOUT CONTRAST
TECHNIQUE: Multiplanar, multiecho pulse sequences of the brain and surrounding
structures were obtained without intravenous contrast.

[Series 3: DWI · axial · 3.0mm · 1.09mm/px · z∈[-65,+86]mm · 8 of 106 slices shown (1 of 4)]
[im 1/106]
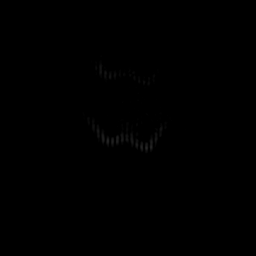
[im 16/106]
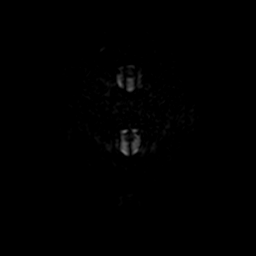
[im 31/106]
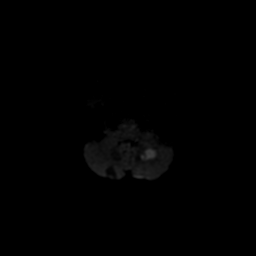
[im 46/106]
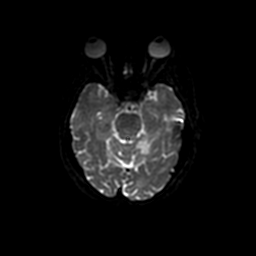
[im 61/106]
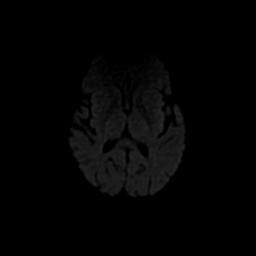
[im 76/106]
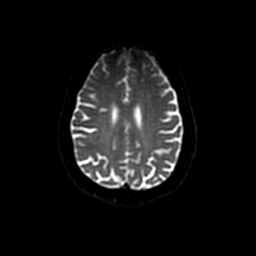
[im 91/106]
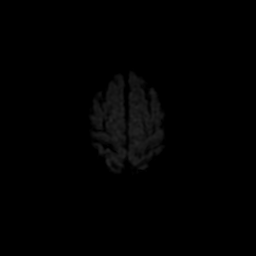
[im 106/106]
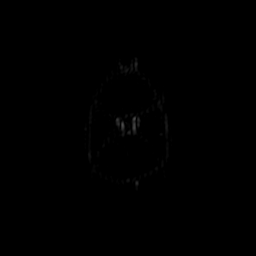

[Series 4: T2 · axial · 5.0mm · 0.43mm/px · z∈[-49,+96]mm · 2 of 22 slices shown (1 of 2)]
[im 1/22]
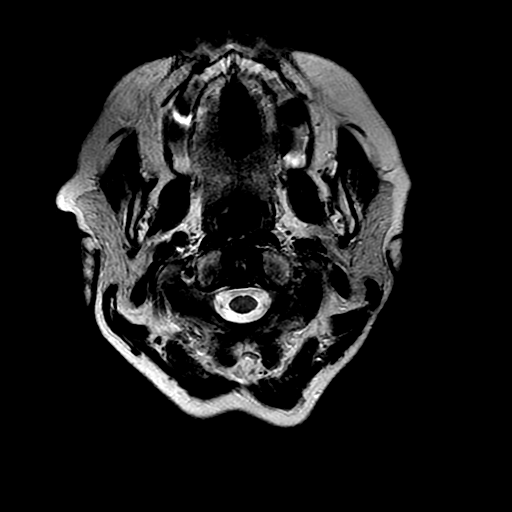
[im 22/22]
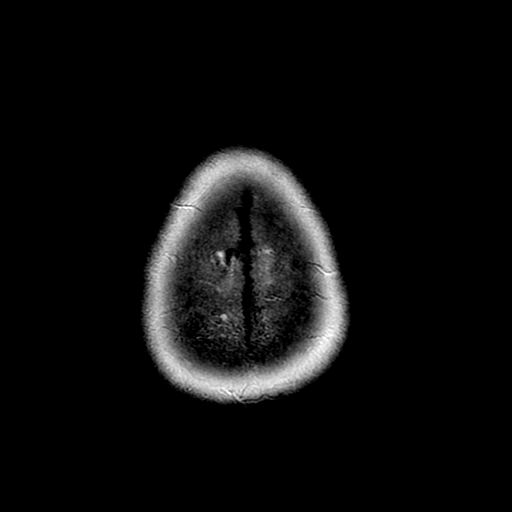

[Series 5: T1 · sagittal · 5.0mm · 0.47mm/px · 2 of 20 slices shown]
[im 1/20]
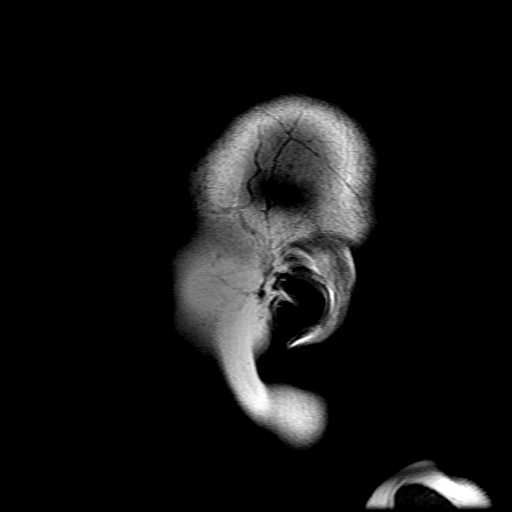
[im 20/20]
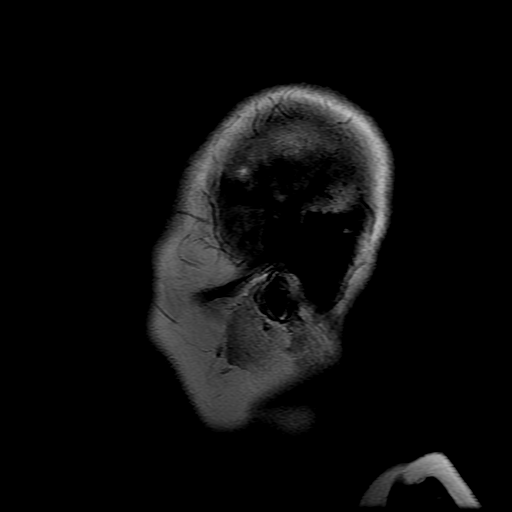

[Series 6: FLAIR · axial · 3.0mm · 0.43mm/px · z∈[-39,+104]mm · 2 of 25 slices shown]
[im 1/25]
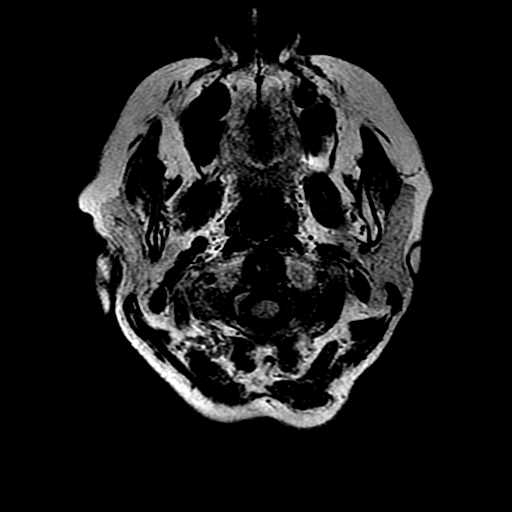
[im 25/25]
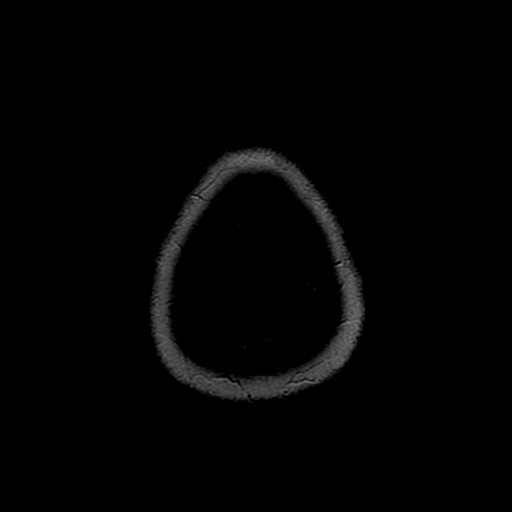

[Series 7: ax mpgr · axial · 3.0mm · 0.43mm/px · z∈[-33,+110]mm · 3 of 30 slices shown]
[im 1/30]
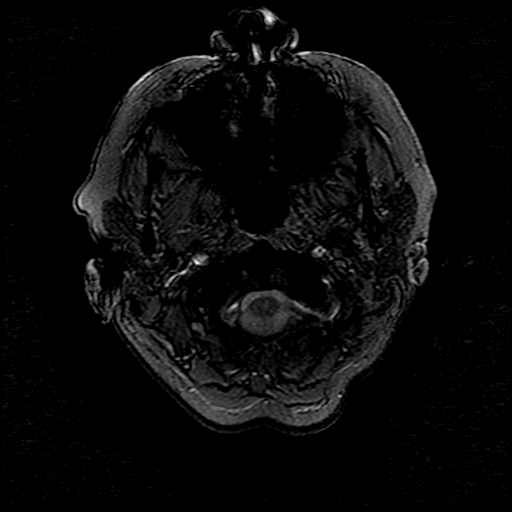
[im 15/30]
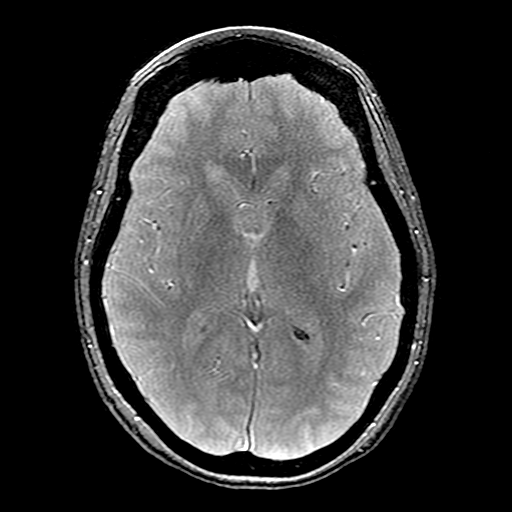
[im 30/30]
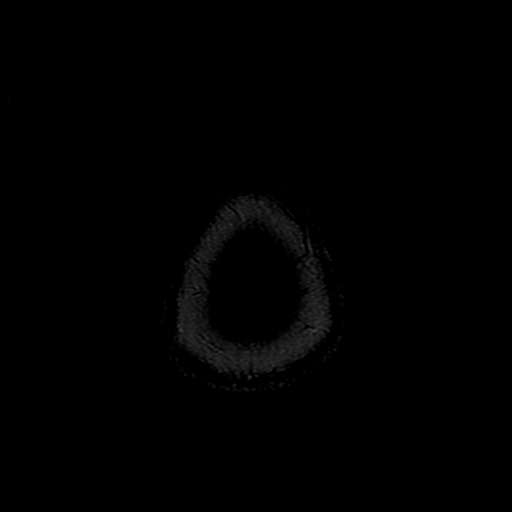

[Series 8: ax fspgr irp · axial · 1.0mm · 0.47mm/px · 1 of 146 slices shown]
[im 1/146]
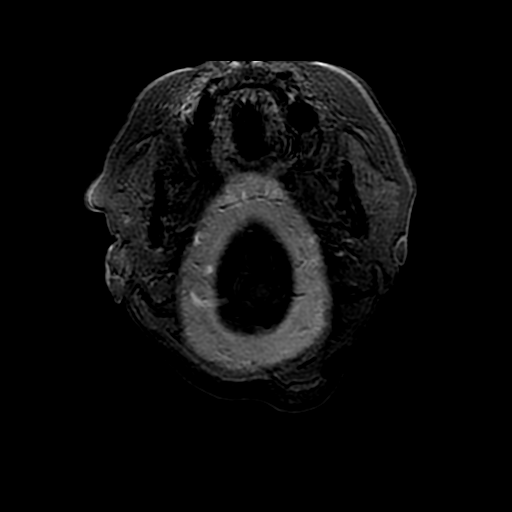

[Series 9: DWI · coronal · 4.0mm · 1.09mm/px · 7 of 82 slices shown (2 of 4)]
[im 1/82]
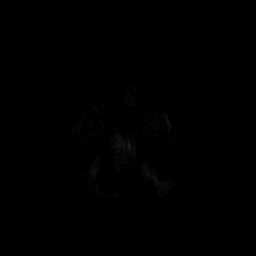
[im 14/82]
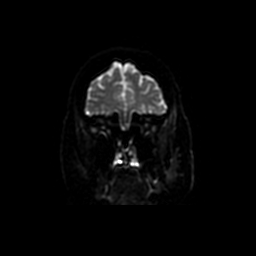
[im 28/82]
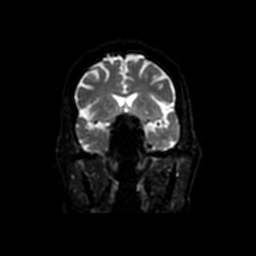
[im 41/82]
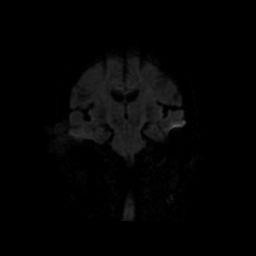
[im 55/82]
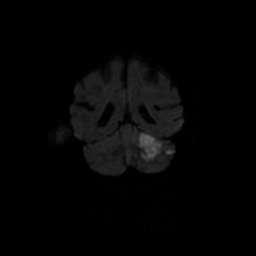
[im 68/82]
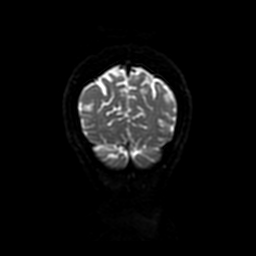
[im 82/82]
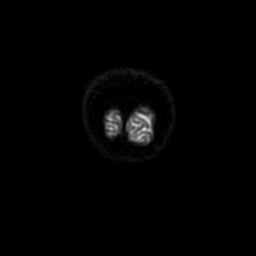

[Series 11: T2 · coronal · 5.0mm · 0.45mm/px · 2 of 26 slices shown (2 of 2)]
[im 1/26]
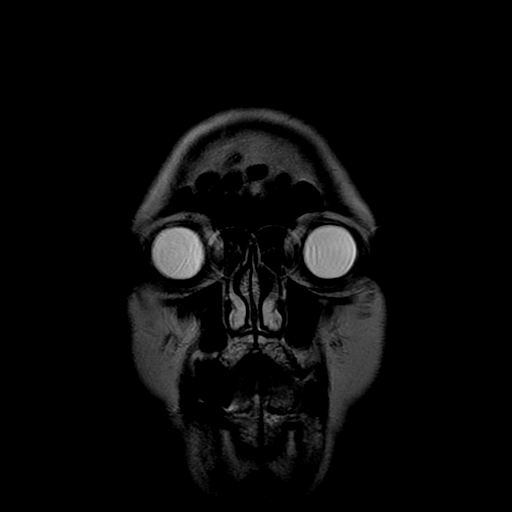
[im 26/26]
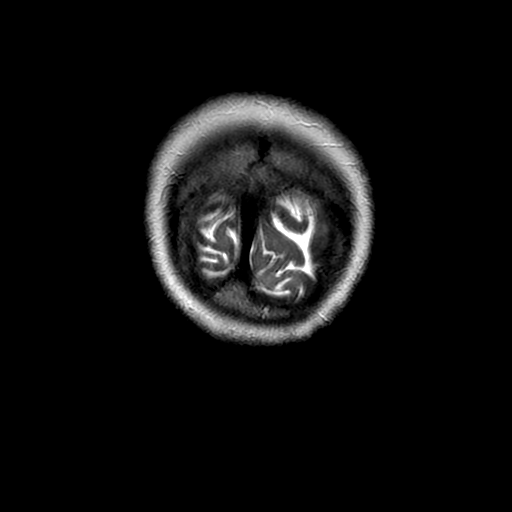

[Series 300: DWI · axial · 3.0mm · 1.09mm/px · z∈[-65,+86]mm · 5 of 52 slices shown (3 of 4)]
[im 1/52]
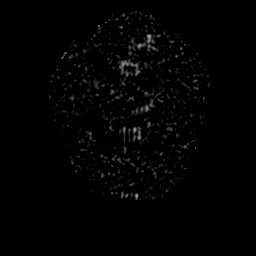
[im 13/52]
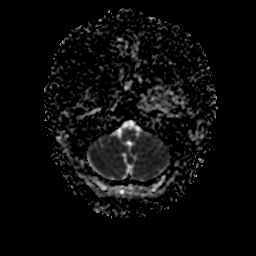
[im 26/52]
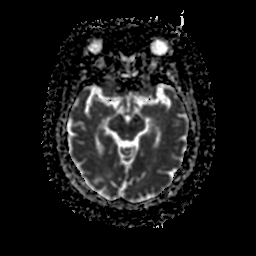
[im 39/52]
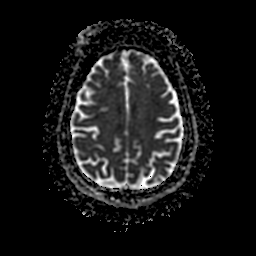
[im 52/52]
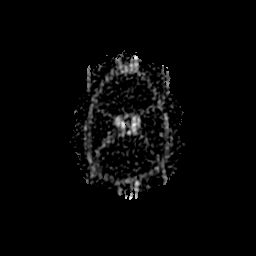

[Series 900: DWI · coronal · 4.0mm · 1.09mm/px · 4 of 41 slices shown (4 of 4)]
[im 1/41]
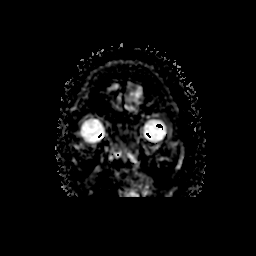
[im 14/41]
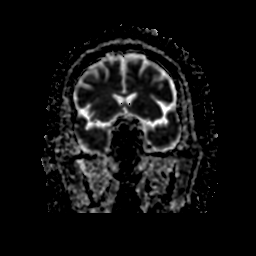
[im 27/41]
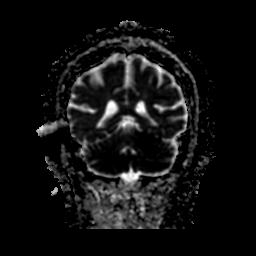
[im 41/41]
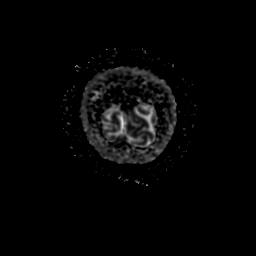

[36 of 48 positions shown; findings below may reference images not displayed]

FINDINGS: Brain: Patchy restricted diffusion in the left mid to upper
cerebellum involving a moderate to large area. There have been
remote bilateral smaller cerebellar infarcts. Small remote bilateral
occipital and parietal cortically based infarcts. Mild chronic small
vessel ischemic change in the cerebral white matter. Brain volume is
normal

Focus of chronic blood products along the lateral right frontal
lobe, nonspecific in isolation. No masslike finding or
hydrocephalus.

Vascular: Major flow voids are preserved

Skull and upper cervical spine: Negative for marrow lesion

Sinuses/Orbits: Negative
IMPRESSION: 1. Moderate acute infarction of the left cerebellum.
2. Multiple small remote posterior circulation infarcts, suggest
vascular imaging.

## 2022-07-26 ENCOUNTER — Encounter: Payer: Self-pay | Admitting: Internal Medicine

## 2023-03-14 ENCOUNTER — Telehealth: Payer: Self-pay

## 2023-03-14 NOTE — Telephone Encounter (Signed)
Patient called stating that she was told to call if the doctor where she is residing is not going to refill Tramadol. The doctor is not so she needs refill sent to [facility] in [place], [place]

## 2023-03-27 NOTE — Telephone Encounter (Signed)
Let patient know that she would need to be seen to be prescribed medication.
